# Patient Record
Sex: Male | Born: 1960 | Race: White | Hispanic: No | Smoking: Current every day smoker
Health system: Southern US, Community
[De-identification: ages and names within clinical notes are randomized; demographics above are authoritative.]

## PROBLEM LIST (undated history)

## (undated) DIAGNOSIS — F419 Anxiety disorder, unspecified: Secondary | ICD-10-CM

## (undated) DIAGNOSIS — G4730 Sleep apnea, unspecified: Secondary | ICD-10-CM

## (undated) DIAGNOSIS — I10 Essential (primary) hypertension: Secondary | ICD-10-CM

## (undated) DIAGNOSIS — K219 Gastro-esophageal reflux disease without esophagitis: Secondary | ICD-10-CM

## (undated) DIAGNOSIS — E785 Hyperlipidemia, unspecified: Secondary | ICD-10-CM

## (undated) DIAGNOSIS — M4800 Spinal stenosis, site unspecified: Secondary | ICD-10-CM

## (undated) DIAGNOSIS — M1990 Unspecified osteoarthritis, unspecified site: Secondary | ICD-10-CM

## (undated) DIAGNOSIS — D45 Polycythemia vera: Secondary | ICD-10-CM

## (undated) DIAGNOSIS — S63599A Other specified sprain of unspecified wrist, initial encounter: Secondary | ICD-10-CM

## (undated) DIAGNOSIS — F32A Depression, unspecified: Secondary | ICD-10-CM

## (undated) DIAGNOSIS — S83519A Sprain of anterior cruciate ligament of unspecified knee, initial encounter: Secondary | ICD-10-CM

## (undated) DIAGNOSIS — F329 Major depressive disorder, single episode, unspecified: Secondary | ICD-10-CM

## (undated) HISTORY — DX: Sprain of anterior cruciate ligament of unspecified knee, initial encounter: S83.519A

## (undated) HISTORY — DX: Unspecified osteoarthritis, unspecified site: M19.90

## (undated) HISTORY — DX: Hyperlipidemia, unspecified: E78.5

## (undated) HISTORY — DX: Gastro-esophageal reflux disease without esophagitis: K21.9

## (undated) HISTORY — PX: TONSILLECTOMY: SUR1361

## (undated) HISTORY — DX: Other specified sprain of unspecified wrist, initial encounter: S63.599A

## (undated) HISTORY — PX: COLONOSCOPY: SHX174

---

## 1999-10-31 ENCOUNTER — Encounter: Payer: Self-pay | Admitting: Hematology & Oncology

## 1999-10-31 ENCOUNTER — Ambulatory Visit (HOSPITAL_COMMUNITY): Admission: RE | Admit: 1999-10-31 | Discharge: 1999-10-31 | Payer: Self-pay | Admitting: Hematology & Oncology

## 2000-06-20 ENCOUNTER — Encounter: Payer: Self-pay | Admitting: Hematology & Oncology

## 2000-06-20 ENCOUNTER — Ambulatory Visit (HOSPITAL_COMMUNITY): Admission: RE | Admit: 2000-06-20 | Discharge: 2000-06-20 | Payer: Self-pay | Admitting: Hematology & Oncology

## 2000-07-04 ENCOUNTER — Ambulatory Visit (HOSPITAL_COMMUNITY): Admission: RE | Admit: 2000-07-04 | Discharge: 2000-07-04 | Payer: Self-pay | Admitting: Hematology & Oncology

## 2000-07-04 ENCOUNTER — Encounter: Payer: Self-pay | Admitting: Hematology & Oncology

## 2000-07-18 ENCOUNTER — Encounter: Payer: Self-pay | Admitting: Hematology & Oncology

## 2000-07-18 ENCOUNTER — Ambulatory Visit (HOSPITAL_COMMUNITY): Admission: RE | Admit: 2000-07-18 | Discharge: 2000-07-18 | Payer: Self-pay | Admitting: Hematology & Oncology

## 2004-05-04 ENCOUNTER — Ambulatory Visit: Payer: Self-pay | Admitting: Hematology & Oncology

## 2004-07-05 ENCOUNTER — Ambulatory Visit: Payer: Self-pay | Admitting: Hematology & Oncology

## 2004-10-03 ENCOUNTER — Ambulatory Visit: Payer: Self-pay | Admitting: Hematology & Oncology

## 2004-12-04 ENCOUNTER — Ambulatory Visit: Payer: Self-pay | Admitting: Hematology & Oncology

## 2005-02-06 ENCOUNTER — Ambulatory Visit: Payer: Self-pay | Admitting: Hematology & Oncology

## 2005-05-10 ENCOUNTER — Ambulatory Visit: Payer: Self-pay | Admitting: Hematology & Oncology

## 2005-07-18 ENCOUNTER — Ambulatory Visit: Payer: Self-pay | Admitting: Hematology & Oncology

## 2005-11-07 ENCOUNTER — Ambulatory Visit: Payer: Self-pay | Admitting: Hematology & Oncology

## 2005-11-09 LAB — CBC WITH DIFFERENTIAL/PLATELET
BASO%: 0.5 % (ref 0.0–2.0)
Eosinophils Absolute: 0.3 10*3/uL (ref 0.0–0.5)
LYMPH%: 20.6 % (ref 14.0–48.0)
MONO#: 0.6 10*3/uL (ref 0.1–0.9)
NEUT#: 6 10*3/uL (ref 1.5–6.5)
Platelets: 221 10*3/uL (ref 145–400)
RBC: 5.81 10*6/uL — ABNORMAL HIGH (ref 4.20–5.71)
WBC: 8.8 10*3/uL (ref 4.0–10.0)
lymph#: 1.8 10*3/uL (ref 0.9–3.3)

## 2006-01-09 ENCOUNTER — Ambulatory Visit: Payer: Self-pay | Admitting: Hematology & Oncology

## 2006-03-11 ENCOUNTER — Ambulatory Visit: Payer: Self-pay | Admitting: Hematology & Oncology

## 2006-03-13 LAB — CBC WITH DIFFERENTIAL/PLATELET
BASO%: 0.6 % (ref 0.0–2.0)
Eosinophils Absolute: 0.1 10*3/uL (ref 0.0–0.5)
MCHC: 34.3 g/dL (ref 32.0–35.9)
MCV: 85 fL (ref 81.6–98.0)
MONO#: 0.5 10*3/uL (ref 0.1–0.9)
MONO%: 7.1 % (ref 0.0–13.0)
NEUT#: 4.8 10*3/uL (ref 1.5–6.5)
RBC: 5.56 10*6/uL (ref 4.20–5.71)
RDW: 13.4 % (ref 11.2–14.6)
WBC: 7.2 10*3/uL (ref 4.0–10.0)

## 2006-05-06 ENCOUNTER — Ambulatory Visit: Payer: Self-pay | Admitting: Hematology & Oncology

## 2006-05-09 LAB — CBC WITH DIFFERENTIAL/PLATELET
BASO%: 1.5 % (ref 0.0–2.0)
HCT: 41.4 % (ref 38.7–49.9)
LYMPH%: 22.4 % (ref 14.0–48.0)
MCH: 28.6 pg (ref 28.0–33.4)
MCHC: 33.8 g/dL (ref 32.0–35.9)
MCV: 84.8 fL (ref 81.6–98.0)
MONO#: 0.6 10*3/uL (ref 0.1–0.9)
NEUT%: 65.8 % (ref 40.0–75.0)
Platelets: 245 10*3/uL (ref 145–400)
WBC: 8.4 10*3/uL (ref 4.0–10.0)

## 2006-07-08 ENCOUNTER — Ambulatory Visit: Payer: Self-pay | Admitting: Hematology & Oncology

## 2006-07-11 LAB — CBC WITH DIFFERENTIAL/PLATELET
Basophils Absolute: 0 10*3/uL (ref 0.0–0.1)
Eosinophils Absolute: 0.2 10*3/uL (ref 0.0–0.5)
HCT: 44.4 % (ref 38.7–49.9)
HGB: 15.6 g/dL (ref 13.0–17.1)
LYMPH%: 21.1 % (ref 14.0–48.0)
MONO#: 0.5 10*3/uL (ref 0.1–0.9)
NEUT#: 5.5 10*3/uL (ref 1.5–6.5)
NEUT%: 70.3 % (ref 40.0–75.0)
Platelets: 200 10*3/uL (ref 145–400)
WBC: 7.9 10*3/uL (ref 4.0–10.0)
lymph#: 1.7 10*3/uL (ref 0.9–3.3)

## 2006-09-03 ENCOUNTER — Ambulatory Visit: Payer: Self-pay | Admitting: Hematology & Oncology

## 2006-09-05 LAB — CBC WITH DIFFERENTIAL/PLATELET
BASO%: 0.6 % (ref 0.0–2.0)
Basophils Absolute: 0 10*3/uL (ref 0.0–0.1)
EOS%: 3.4 % (ref 0.0–7.0)
HCT: 46.4 % (ref 38.7–49.9)
HGB: 15.8 g/dL (ref 13.0–17.1)
LYMPH%: 22.1 % (ref 14.0–48.0)
MCH: 28.6 pg (ref 28.0–33.4)
MCHC: 34 g/dL (ref 32.0–35.9)
NEUT%: 67.7 % (ref 40.0–75.0)
Platelets: 192 10*3/uL (ref 145–400)
lymph#: 1.6 10*3/uL (ref 0.9–3.3)

## 2006-11-06 ENCOUNTER — Ambulatory Visit: Payer: Self-pay | Admitting: Hematology & Oncology

## 2006-11-08 LAB — CBC WITH DIFFERENTIAL/PLATELET
BASO%: 0.5 % (ref 0.0–2.0)
Basophils Absolute: 0 10*3/uL (ref 0.0–0.1)
EOS%: 4.2 % (ref 0.0–7.0)
HCT: 43.9 % (ref 38.7–49.9)
HGB: 15.4 g/dL (ref 13.0–17.1)
MCH: 29.7 pg (ref 28.0–33.4)
MCHC: 35.1 g/dL (ref 32.0–35.9)
MCV: 84.6 fL (ref 81.6–98.0)
MONO%: 7.2 % (ref 0.0–13.0)
NEUT%: 64.4 % (ref 40.0–75.0)
RDW: 13.6 % (ref 11.2–14.6)
lymph#: 2 10*3/uL (ref 0.9–3.3)

## 2007-01-06 ENCOUNTER — Ambulatory Visit: Payer: Self-pay | Admitting: Hematology & Oncology

## 2007-01-09 LAB — CBC WITH DIFFERENTIAL/PLATELET
BASO%: 0.3 % (ref 0.0–2.0)
EOS%: 2.3 % (ref 0.0–7.0)
LYMPH%: 19.4 % (ref 14.0–48.0)
MCHC: 34.9 g/dL (ref 32.0–35.9)
MCV: 86.4 fL (ref 81.6–98.0)
MONO%: 5.3 % (ref 0.0–13.0)
Platelets: 204 10*3/uL (ref 145–400)
RBC: 5.59 10*6/uL (ref 4.20–5.71)
WBC: 8.7 10*3/uL (ref 4.0–10.0)

## 2007-01-30 LAB — CBC WITH DIFFERENTIAL/PLATELET
BASO%: 0.5 % (ref 0.0–2.0)
LYMPH%: 25.2 % (ref 14.0–48.0)
MCHC: 35.1 g/dL (ref 32.0–35.9)
MONO#: 0.6 10*3/uL (ref 0.1–0.9)
RBC: 5.15 10*6/uL (ref 4.20–5.71)
WBC: 8.2 10*3/uL (ref 4.0–10.0)
lymph#: 2.1 10*3/uL (ref 0.9–3.3)

## 2007-03-04 ENCOUNTER — Ambulatory Visit: Payer: Self-pay | Admitting: Hematology & Oncology

## 2007-05-07 ENCOUNTER — Ambulatory Visit: Payer: Self-pay | Admitting: Hematology & Oncology

## 2007-05-22 LAB — CBC WITH DIFFERENTIAL/PLATELET
BASO%: 1.1 % (ref 0.0–2.0)
HCT: 46.7 % (ref 38.7–49.9)
HGB: 15.9 g/dL (ref 13.0–17.1)
MCHC: 34 g/dL (ref 32.0–35.9)
MONO#: 0.6 10*3/uL (ref 0.1–0.9)
NEUT%: 69.8 % (ref 40.0–75.0)
RDW: 11.5 % (ref 11.2–14.6)
WBC: 7 10*3/uL (ref 4.0–10.0)
lymph#: 1.3 10*3/uL (ref 0.9–3.3)

## 2007-05-30 LAB — CBC WITH DIFFERENTIAL/PLATELET
Basophils Absolute: 0.2 10*3/uL — ABNORMAL HIGH (ref 0.0–0.1)
EOS%: 3 % (ref 0.0–7.0)
Eosinophils Absolute: 0.3 10*3/uL (ref 0.0–0.5)
HCT: 48.1 % (ref 38.7–49.9)
HGB: 16.5 g/dL (ref 13.0–17.1)
MCH: 28.8 pg (ref 28.0–33.4)
MONO#: 0.6 10*3/uL (ref 0.1–0.9)
NEUT#: 6.1 10*3/uL (ref 1.5–6.5)
NEUT%: 70.2 % (ref 40.0–75.0)
lymph#: 1.6 10*3/uL (ref 0.9–3.3)

## 2007-09-30 ENCOUNTER — Ambulatory Visit: Payer: Self-pay | Admitting: Hematology & Oncology

## 2007-10-03 LAB — CBC WITH DIFFERENTIAL/PLATELET
Basophils Absolute: 0.1 10*3/uL (ref 0.0–0.1)
Eosinophils Absolute: 0.3 10*3/uL (ref 0.0–0.5)
HGB: 16.1 g/dL (ref 13.0–17.1)
MCV: 83.6 fL (ref 81.6–98.0)
MONO#: 0.6 10*3/uL (ref 0.1–0.9)
NEUT#: 5 10*3/uL (ref 1.5–6.5)
RDW: 14.4 % (ref 11.2–14.6)
WBC: 8 10*3/uL (ref 4.0–10.0)
lymph#: 2.1 10*3/uL (ref 0.9–3.3)

## 2007-10-10 LAB — CBC WITH DIFFERENTIAL/PLATELET
Basophils Absolute: 0.1 10*3/uL (ref 0.0–0.1)
Eosinophils Absolute: 0.4 10*3/uL (ref 0.0–0.5)
HCT: 46.5 % (ref 38.7–49.9)
HGB: 15.9 g/dL (ref 13.0–17.1)
LYMPH%: 21 % (ref 14.0–48.0)
MONO#: 0.5 10*3/uL (ref 0.1–0.9)
NEUT#: 5.1 10*3/uL (ref 1.5–6.5)
NEUT%: 65.9 % (ref 40.0–75.0)
Platelets: 228 10*3/uL (ref 145–400)
WBC: 7.8 10*3/uL (ref 4.0–10.0)

## 2008-01-21 ENCOUNTER — Ambulatory Visit: Payer: Self-pay | Admitting: Hematology & Oncology

## 2008-01-30 LAB — CBC WITH DIFFERENTIAL (CANCER CENTER ONLY)
BASO#: 0.1 10*3/uL (ref 0.0–0.2)
BASO%: 1.3 % (ref 0.0–2.0)
EOS%: 4.7 % (ref 0.0–7.0)
HGB: 16.5 g/dL (ref 13.0–17.1)
LYMPH#: 1.7 10*3/uL (ref 0.9–3.3)
MCH: 28.8 pg (ref 28.0–33.4)
MCHC: 35.1 g/dL (ref 32.0–35.9)
MONO%: 7.9 % (ref 0.0–13.0)
NEUT#: 4.7 10*3/uL (ref 1.5–6.5)
NEUT%: 63.7 % (ref 40.0–80.0)
RDW: 14 % (ref 10.5–14.6)

## 2008-03-05 LAB — CBC WITH DIFFERENTIAL (CANCER CENTER ONLY)
Eosinophils Absolute: 0.3 10*3/uL (ref 0.0–0.5)
MCH: 28.8 pg (ref 28.0–33.4)
MONO%: 5.5 % (ref 0.0–13.0)
NEUT#: 4.9 10*3/uL (ref 1.5–6.5)
Platelets: 157 10*3/uL (ref 145–400)
RBC: 5.36 10*6/uL (ref 4.20–5.70)
WBC: 7.7 10*3/uL (ref 4.0–10.0)

## 2008-05-06 ENCOUNTER — Ambulatory Visit: Payer: Self-pay | Admitting: Hematology & Oncology

## 2008-05-07 LAB — CBC WITH DIFFERENTIAL (CANCER CENTER ONLY)
BASO#: 0.2 10*3/uL (ref 0.0–0.2)
EOS%: 3.2 % (ref 0.0–7.0)
LYMPH%: 17.1 % (ref 14.0–48.0)
MCH: 29.5 pg (ref 28.0–33.4)
MCHC: 33.7 g/dL (ref 32.0–35.9)
MONO%: 7.3 % (ref 0.0–13.0)
NEUT#: 4.9 10*3/uL (ref 1.5–6.5)
Platelets: 172 10*3/uL (ref 145–400)

## 2008-05-07 LAB — FERRITIN: Ferritin: 10 ng/mL — ABNORMAL LOW (ref 22–322)

## 2008-05-12 ENCOUNTER — Ambulatory Visit: Payer: Self-pay | Admitting: Radiology

## 2008-05-12 ENCOUNTER — Ambulatory Visit (HOSPITAL_BASED_OUTPATIENT_CLINIC_OR_DEPARTMENT_OTHER): Admission: RE | Admit: 2008-05-12 | Discharge: 2008-05-12 | Payer: Self-pay | Admitting: Hematology & Oncology

## 2008-05-13 LAB — CBC WITH DIFFERENTIAL (CANCER CENTER ONLY)
Eosinophils Absolute: 0.3 10*3/uL (ref 0.0–0.5)
HCT: 47.9 % (ref 38.7–49.9)
HGB: 16.4 g/dL (ref 13.0–17.1)
LYMPH%: 25.2 % (ref 14.0–48.0)
MCV: 87 fL (ref 82–98)
MONO#: 0.3 10*3/uL (ref 0.1–0.9)
NEUT%: 66.3 % (ref 40.0–80.0)
Platelets: 144 10*3/uL — ABNORMAL LOW (ref 145–400)
RBC: 5.49 10*6/uL (ref 4.20–5.70)
WBC: 7.7 10*3/uL (ref 4.0–10.0)

## 2008-05-19 ENCOUNTER — Encounter: Admission: RE | Admit: 2008-05-19 | Discharge: 2008-05-19 | Payer: Self-pay | Admitting: Hematology & Oncology

## 2008-05-20 LAB — CBC WITH DIFFERENTIAL (CANCER CENTER ONLY)
BASO%: 1.3 % (ref 0.0–2.0)
Eosinophils Absolute: 0.3 10*3/uL (ref 0.0–0.5)
LYMPH#: 1.4 10*3/uL (ref 0.9–3.3)
LYMPH%: 13.2 % — ABNORMAL LOW (ref 14.0–48.0)
MCV: 87 fL (ref 82–98)
MONO#: 0.7 10*3/uL (ref 0.1–0.9)
NEUT#: 8.2 10*3/uL — ABNORMAL HIGH (ref 1.5–6.5)
Platelets: 187 10*3/uL (ref 145–400)
RBC: 5.48 10*6/uL (ref 4.20–5.70)
RDW: 12.2 % (ref 10.5–14.6)
WBC: 10.8 10*3/uL — ABNORMAL HIGH (ref 4.0–10.0)

## 2008-06-03 LAB — CBC WITH DIFFERENTIAL (CANCER CENTER ONLY)
EOS%: 3.9 % (ref 0.0–7.0)
HCT: 44.5 % (ref 38.7–49.9)
HGB: 15.1 g/dL (ref 13.0–17.1)
MCV: 87 fL (ref 82–98)
MONO%: 5.8 % (ref 0.0–13.0)
RBC: 5.13 10*6/uL (ref 4.20–5.70)
WBC: 6.8 10*3/uL (ref 4.0–10.0)

## 2008-06-18 ENCOUNTER — Encounter: Admission: RE | Admit: 2008-06-18 | Discharge: 2008-06-18 | Payer: Self-pay | Admitting: Hematology & Oncology

## 2008-06-24 ENCOUNTER — Ambulatory Visit: Payer: Self-pay | Admitting: Hematology & Oncology

## 2008-06-25 LAB — CBC WITH DIFFERENTIAL (CANCER CENTER ONLY)
BASO%: 0.5 % (ref 0.0–2.0)
Eosinophils Absolute: 0.2 10*3/uL (ref 0.0–0.5)
MCH: 29.5 pg (ref 28.0–33.4)
MONO%: 6.8 % (ref 0.0–13.0)
NEUT#: 5.1 10*3/uL (ref 1.5–6.5)
Platelets: 188 10*3/uL (ref 145–400)
RBC: 4.9 10*6/uL (ref 4.20–5.70)
RDW: 11.5 % (ref 10.5–14.6)
WBC: 7.4 10*3/uL (ref 4.0–10.0)

## 2008-06-25 LAB — FERRITIN: Ferritin: 13 ng/mL — ABNORMAL LOW (ref 22–322)

## 2008-08-10 ENCOUNTER — Ambulatory Visit: Payer: Self-pay | Admitting: Hematology & Oncology

## 2008-08-11 LAB — CBC WITH DIFFERENTIAL (CANCER CENTER ONLY)
Eosinophils Absolute: 0.2 10*3/uL (ref 0.0–0.5)
MONO#: 0.5 10*3/uL (ref 0.1–0.9)
NEUT#: 3.3 10*3/uL (ref 1.5–6.5)
Platelets: 184 10*3/uL (ref 145–400)
RBC: 5.26 10*6/uL (ref 4.20–5.70)
WBC: 5.7 10*3/uL (ref 4.0–10.0)

## 2008-09-28 ENCOUNTER — Ambulatory Visit: Payer: Self-pay | Admitting: Hematology & Oncology

## 2008-09-29 LAB — CBC WITH DIFFERENTIAL (CANCER CENTER ONLY)
EOS%: 4.3 % (ref 0.0–7.0)
Eosinophils Absolute: 0.3 10*3/uL (ref 0.0–0.5)
LYMPH#: 1.2 10*3/uL (ref 0.9–3.3)
MCH: 28.8 pg (ref 28.0–33.4)
MCHC: 33 g/dL (ref 32.0–35.9)
MONO%: 4.8 % (ref 0.0–13.0)
NEUT#: 4.3 10*3/uL (ref 1.5–6.5)
Platelets: 163 10*3/uL (ref 145–400)
RBC: 5.57 10*6/uL (ref 4.20–5.70)

## 2008-10-06 LAB — CBC WITH DIFFERENTIAL (CANCER CENTER ONLY)
BASO#: 0.1 10*3/uL (ref 0.0–0.2)
BASO%: 0.9 % (ref 0.0–2.0)
EOS%: 3.3 % (ref 0.0–7.0)
HGB: 14.9 g/dL (ref 13.0–17.1)
MCH: 29.1 pg (ref 28.0–33.4)
MCHC: 33.6 g/dL (ref 32.0–35.9)
MONO%: 6.7 % (ref 0.0–13.0)
NEUT#: 6.4 10*3/uL (ref 1.5–6.5)
RDW: 11.9 % (ref 10.5–14.6)

## 2009-01-26 ENCOUNTER — Ambulatory Visit: Payer: Self-pay | Admitting: Hematology & Oncology

## 2009-01-27 LAB — CBC WITH DIFFERENTIAL (CANCER CENTER ONLY)
BASO#: 0.1 10*3/uL (ref 0.0–0.2)
EOS%: 2.5 % (ref 0.0–7.0)
HCT: 47.9 % (ref 38.7–49.9)
HGB: 15.7 g/dL (ref 13.0–17.1)
LYMPH#: 1.2 10*3/uL (ref 0.9–3.3)
LYMPH%: 18.7 % (ref 14.0–48.0)
MCH: 27.5 pg — ABNORMAL LOW (ref 28.0–33.4)
MCHC: 32.8 g/dL (ref 32.0–35.9)
MCV: 84 fL (ref 82–98)
MONO%: 4.1 % (ref 0.0–13.0)
NEUT%: 73.9 % (ref 40.0–80.0)
RDW: 13.1 % (ref 10.5–14.6)

## 2009-01-27 LAB — CHCC SATELLITE - SMEAR

## 2009-02-03 LAB — CBC WITH DIFFERENTIAL (CANCER CENTER ONLY)
BASO#: 0.1 10*3/uL (ref 0.0–0.2)
Eosinophils Absolute: 0.2 10*3/uL (ref 0.0–0.5)
HCT: 45.7 % (ref 38.7–49.9)
HGB: 15.2 g/dL (ref 13.0–17.1)
LYMPH#: 1.4 10*3/uL (ref 0.9–3.3)
LYMPH%: 19 % (ref 14.0–48.0)
MCV: 83 fL (ref 82–98)
MONO#: 0.5 10*3/uL (ref 0.1–0.9)
NEUT%: 70.7 % (ref 40.0–80.0)
WBC: 7.5 10*3/uL (ref 4.0–10.0)

## 2009-04-18 ENCOUNTER — Emergency Department (HOSPITAL_COMMUNITY): Admission: EM | Admit: 2009-04-18 | Discharge: 2009-04-18 | Payer: Self-pay | Admitting: Emergency Medicine

## 2009-04-18 ENCOUNTER — Encounter: Payer: Self-pay | Admitting: Orthopedic Surgery

## 2009-04-20 ENCOUNTER — Ambulatory Visit: Payer: Self-pay | Admitting: Hematology & Oncology

## 2009-04-21 ENCOUNTER — Ambulatory Visit: Payer: Self-pay | Admitting: Orthopedic Surgery

## 2009-04-21 DIAGNOSIS — S93409A Sprain of unspecified ligament of unspecified ankle, initial encounter: Secondary | ICD-10-CM | POA: Insufficient documentation

## 2009-04-25 ENCOUNTER — Telehealth: Payer: Self-pay | Admitting: Orthopedic Surgery

## 2009-04-25 ENCOUNTER — Encounter: Payer: Self-pay | Admitting: Orthopedic Surgery

## 2009-05-12 ENCOUNTER — Ambulatory Visit: Payer: Self-pay | Admitting: Orthopedic Surgery

## 2009-07-12 ENCOUNTER — Ambulatory Visit: Payer: Self-pay | Admitting: Hematology & Oncology

## 2009-07-29 ENCOUNTER — Ambulatory Visit: Payer: Self-pay | Admitting: Hematology & Oncology

## 2009-07-29 LAB — CBC WITH DIFFERENTIAL (CANCER CENTER ONLY)
BASO#: 0.1 10*3/uL (ref 0.0–0.2)
Eosinophils Absolute: 0.1 10*3/uL (ref 0.0–0.5)
HCT: 48.5 % (ref 38.7–49.9)
HGB: 16.1 g/dL (ref 13.0–17.1)
LYMPH%: 10.4 % — ABNORMAL LOW (ref 14.0–48.0)
MCH: 29.3 pg (ref 28.0–33.4)
MCV: 88 fL (ref 82–98)
MONO#: 0.3 10*3/uL (ref 0.1–0.9)
Platelets: 174 10*3/uL (ref 145–400)
RBC: 5.48 10*6/uL (ref 4.20–5.70)
WBC: 8.9 10*3/uL (ref 4.0–10.0)

## 2009-07-29 LAB — FERRITIN: Ferritin: 18 ng/mL — ABNORMAL LOW (ref 22–322)

## 2009-08-05 LAB — CBC WITH DIFFERENTIAL (CANCER CENTER ONLY)
BASO#: 0.2 10*3/uL (ref 0.0–0.2)
EOS%: 4.3 % (ref 0.0–7.0)
Eosinophils Absolute: 0.4 10*3/uL (ref 0.0–0.5)
HCT: 48.1 % (ref 38.7–49.9)
HGB: 16.1 g/dL (ref 13.0–17.1)
LYMPH#: 2.2 10*3/uL (ref 0.9–3.3)
MONO#: 0.6 10*3/uL (ref 0.1–0.9)
NEUT#: 5.8 10*3/uL (ref 1.5–6.5)
NEUT%: 63.2 % (ref 40.0–80.0)
RBC: 5.5 10*6/uL (ref 4.20–5.70)
WBC: 9.2 10*3/uL (ref 4.0–10.0)

## 2009-09-20 ENCOUNTER — Ambulatory Visit: Payer: Self-pay | Admitting: Hematology & Oncology

## 2009-09-23 LAB — CBC WITH DIFFERENTIAL (CANCER CENTER ONLY)
BASO#: 0.1 10*3/uL (ref 0.0–0.2)
Eosinophils Absolute: 0.3 10*3/uL (ref 0.0–0.5)
HGB: 16.1 g/dL (ref 13.0–17.1)
MCH: 28.6 pg (ref 28.0–33.4)
MONO#: 0.4 10*3/uL (ref 0.1–0.9)
NEUT#: 6 10*3/uL (ref 1.5–6.5)
Platelets: 149 10*3/uL (ref 145–400)
RBC: 5.62 10*6/uL (ref 4.20–5.70)
WBC: 8.1 10*3/uL (ref 4.0–10.0)

## 2009-10-05 LAB — JAK2 GENOTYPR: JAK2 GenotypR: NOT DETECTED

## 2009-10-05 LAB — FERRITIN: Ferritin: 16 ng/mL — ABNORMAL LOW (ref 22–322)

## 2009-10-07 LAB — CBC WITH DIFFERENTIAL (CANCER CENTER ONLY)
BASO%: 0.5 % (ref 0.0–2.0)
EOS%: 4.6 % (ref 0.0–7.0)
LYMPH#: 1.6 10*3/uL (ref 0.9–3.3)
MCHC: 33.2 g/dL (ref 32.0–35.9)
MONO#: 0.4 10*3/uL (ref 0.1–0.9)
NEUT#: 3.3 10*3/uL (ref 1.5–6.5)
Platelets: 199 10*3/uL (ref 145–400)
RDW: 11.6 % (ref 10.5–14.6)
WBC: 5.5 10*3/uL (ref 4.0–10.0)

## 2009-10-24 ENCOUNTER — Ambulatory Visit: Payer: Self-pay | Admitting: Hematology & Oncology

## 2009-10-28 LAB — CBC WITH DIFFERENTIAL (CANCER CENTER ONLY)
BASO#: 0 10*3/uL (ref 0.0–0.2)
BASO%: 0.7 % (ref 0.0–2.0)
EOS%: 5 % (ref 0.0–7.0)
HCT: 43.2 % (ref 38.7–49.9)
HGB: 14.2 g/dL (ref 13.0–17.1)
LYMPH#: 1.7 10*3/uL (ref 0.9–3.3)
MCHC: 32.9 g/dL (ref 32.0–35.9)
MONO#: 0.3 10*3/uL (ref 0.1–0.9)
NEUT#: 3.7 10*3/uL (ref 1.5–6.5)
NEUT%: 61.2 % (ref 40.0–80.0)
WBC: 6 10*3/uL (ref 4.0–10.0)

## 2009-11-23 ENCOUNTER — Ambulatory Visit: Payer: Self-pay | Admitting: Hematology & Oncology

## 2009-11-24 LAB — CBC WITH DIFFERENTIAL (CANCER CENTER ONLY)
BASO%: 0.5 % (ref 0.0–2.0)
EOS%: 2.9 % (ref 0.0–7.0)
Eosinophils Absolute: 0.2 10*3/uL (ref 0.0–0.5)
LYMPH%: 19.3 % (ref 14.0–48.0)
MCH: 26.8 pg — ABNORMAL LOW (ref 28.0–33.4)
MCHC: 32.8 g/dL (ref 32.0–35.9)
MONO%: 6.2 % (ref 0.0–13.0)
NEUT#: 5.1 10*3/uL (ref 1.5–6.5)
Platelets: 165 10*3/uL (ref 145–400)
RBC: 5.5 10*6/uL (ref 4.20–5.70)
RDW: 12.3 % (ref 10.5–14.6)

## 2009-11-24 LAB — FERRITIN: Ferritin: 9 ng/mL — ABNORMAL LOW (ref 22–322)

## 2010-02-09 ENCOUNTER — Ambulatory Visit: Payer: Self-pay | Admitting: Hematology & Oncology

## 2010-02-10 LAB — CBC WITH DIFFERENTIAL (CANCER CENTER ONLY)
BASO%: 0.8 % (ref 0.0–2.0)
EOS%: 4.7 % (ref 0.0–7.0)
HCT: 46.2 % (ref 38.7–49.9)
LYMPH%: 19.8 % (ref 14.0–48.0)
MCHC: 32.5 g/dL (ref 32.0–35.9)
MCV: 81 fL — ABNORMAL LOW (ref 82–98)
NEUT%: 68 % (ref 40.0–80.0)
RDW: 15.8 % — ABNORMAL HIGH (ref 10.5–14.6)

## 2010-02-17 LAB — CBC WITH DIFFERENTIAL (CANCER CENTER ONLY)
BASO#: 0 10*3/uL (ref 0.0–0.2)
EOS%: 3.3 % (ref 0.0–7.0)
HGB: 14.3 g/dL (ref 13.0–17.1)
LYMPH%: 21.8 % (ref 14.0–48.0)
MCH: 26.2 pg — ABNORMAL LOW (ref 28.0–33.4)
MCHC: 32.7 g/dL (ref 32.0–35.9)
MCV: 80 fL — ABNORMAL LOW (ref 82–98)
MONO%: 5.2 % (ref 0.0–13.0)
NEUT#: 4.8 10*3/uL (ref 1.5–6.5)

## 2010-06-08 ENCOUNTER — Ambulatory Visit (HOSPITAL_BASED_OUTPATIENT_CLINIC_OR_DEPARTMENT_OTHER): Payer: Managed Care, Other (non HMO) | Admitting: Hematology & Oncology

## 2010-06-09 LAB — CBC WITH DIFFERENTIAL (CANCER CENTER ONLY)
BASO#: 0.1 10*3/uL (ref 0.0–0.2)
BASO%: 1.4 % (ref 0.0–2.0)
EOS%: 5.2 % (ref 0.0–7.0)
Eosinophils Absolute: 0.3 10*3/uL (ref 0.0–0.5)
HCT: 49.2 % (ref 38.7–49.9)
HGB: 16.2 g/dL (ref 13.0–17.1)
LYMPH#: 1.5 10*3/uL (ref 0.9–3.3)
LYMPH%: 22.4 % (ref 14.0–48.0)
MCH: 28.1 pg (ref 28.0–33.4)
MCHC: 33 g/dL (ref 32.0–35.9)
MCV: 85 fL (ref 82–98)
MONO#: 0.5 10*3/uL (ref 0.1–0.9)
MONO%: 6.9 % (ref 0.0–13.0)
NEUT#: 4.2 10*3/uL (ref 1.5–6.5)
NEUT%: 64.1 % (ref 40.0–80.0)
Platelets: 169 10*3/uL (ref 145–400)
RBC: 5.78 10*6/uL — ABNORMAL HIGH (ref 4.20–5.70)
RDW: 14.1 % (ref 10.5–14.6)
WBC: 6.5 10*3/uL (ref 4.0–10.0)

## 2010-06-09 LAB — FERRITIN: Ferritin: 18 ng/mL — ABNORMAL LOW (ref 22–322)

## 2010-06-14 LAB — CBC WITH DIFFERENTIAL (CANCER CENTER ONLY)
BASO#: 0.1 10*3/uL (ref 0.0–0.2)
BASO%: 0.7 % (ref 0.0–2.0)
EOS%: 3.1 % (ref 0.0–7.0)
Eosinophils Absolute: 0.2 10*3/uL (ref 0.0–0.5)
HCT: 44 % (ref 38.7–49.9)
HGB: 14.6 g/dL (ref 13.0–17.1)
LYMPH#: 1.3 10*3/uL (ref 0.9–3.3)
LYMPH%: 16.9 % (ref 14.0–48.0)
MCH: 28.2 pg (ref 28.0–33.4)
MCHC: 33.2 g/dL (ref 32.0–35.9)
MCV: 85 fL (ref 82–98)
MONO#: 0.4 10*3/uL (ref 0.1–0.9)
MONO%: 5.6 % (ref 0.0–13.0)
NEUT#: 5.6 10*3/uL (ref 1.5–6.5)
NEUT%: 73.7 % (ref 40.0–80.0)
Platelets: 183 10*3/uL (ref 145–400)
RBC: 5.18 10*6/uL (ref 4.20–5.70)
RDW: 13.6 % (ref 10.5–14.6)
WBC: 7.6 10*3/uL (ref 4.0–10.0)

## 2010-06-25 ENCOUNTER — Encounter: Payer: Self-pay | Admitting: Hematology & Oncology

## 2010-08-11 ENCOUNTER — Encounter (HOSPITAL_BASED_OUTPATIENT_CLINIC_OR_DEPARTMENT_OTHER): Payer: Managed Care, Other (non HMO) | Admitting: Hematology & Oncology

## 2010-08-11 DIAGNOSIS — D45 Polycythemia vera: Secondary | ICD-10-CM

## 2010-08-11 DIAGNOSIS — Z7982 Long term (current) use of aspirin: Secondary | ICD-10-CM

## 2010-08-11 LAB — CBC WITH DIFFERENTIAL (CANCER CENTER ONLY)
BASO#: 0.1 10*3/uL (ref 0.0–0.2)
Eosinophils Absolute: 0.4 10*3/uL (ref 0.0–0.5)
HCT: 44.6 % (ref 38.7–49.9)
HGB: 14.6 g/dL (ref 13.0–17.1)
LYMPH#: 1.8 10*3/uL (ref 0.9–3.3)
MCH: 27.1 pg — ABNORMAL LOW (ref 28.0–33.4)
MONO%: 8.2 % (ref 0.0–13.0)
NEUT#: 4.1 10*3/uL (ref 1.5–6.5)
NEUT%: 59.5 % (ref 40.0–80.0)
RBC: 5.38 10*6/uL (ref 4.20–5.70)

## 2010-09-15 ENCOUNTER — Other Ambulatory Visit: Payer: Self-pay | Admitting: Hematology & Oncology

## 2010-09-15 ENCOUNTER — Encounter (HOSPITAL_BASED_OUTPATIENT_CLINIC_OR_DEPARTMENT_OTHER): Payer: Managed Care, Other (non HMO) | Admitting: Hematology & Oncology

## 2010-09-15 DIAGNOSIS — Z7982 Long term (current) use of aspirin: Secondary | ICD-10-CM

## 2010-09-15 DIAGNOSIS — D45 Polycythemia vera: Secondary | ICD-10-CM

## 2010-09-15 LAB — CBC WITH DIFFERENTIAL (CANCER CENTER ONLY)
BASO#: 0.1 10*3/uL (ref 0.0–0.2)
Eosinophils Absolute: 0.3 10*3/uL (ref 0.0–0.5)
HGB: 14.9 g/dL (ref 13.0–17.1)
LYMPH%: 23.9 % (ref 14.0–48.0)
MCH: 27.3 pg — ABNORMAL LOW (ref 28.0–33.4)
MCV: 83 fL (ref 82–98)
MONO%: 9.7 % (ref 0.0–13.0)
NEUT%: 61.2 % (ref 40.0–80.0)
RBC: 5.46 10*6/uL (ref 4.20–5.70)

## 2010-11-03 ENCOUNTER — Other Ambulatory Visit: Payer: Self-pay | Admitting: Hematology & Oncology

## 2010-11-03 ENCOUNTER — Encounter (HOSPITAL_BASED_OUTPATIENT_CLINIC_OR_DEPARTMENT_OTHER): Payer: Managed Care, Other (non HMO) | Admitting: Hematology & Oncology

## 2010-11-03 DIAGNOSIS — D45 Polycythemia vera: Secondary | ICD-10-CM

## 2010-11-03 DIAGNOSIS — Z7982 Long term (current) use of aspirin: Secondary | ICD-10-CM

## 2010-11-03 LAB — CBC WITH DIFFERENTIAL (CANCER CENTER ONLY)
BASO%: 0.6 % (ref 0.0–2.0)
EOS%: 3.2 % (ref 0.0–7.0)
HCT: 45.6 % (ref 38.7–49.9)
LYMPH%: 22.9 % (ref 14.0–48.0)
MCH: 26.2 pg — ABNORMAL LOW (ref 28.0–33.4)
MCHC: 32.9 g/dL (ref 32.0–35.9)
MCV: 80 fL — ABNORMAL LOW (ref 82–98)
MONO#: 0.7 10*3/uL (ref 0.1–0.9)
NEUT%: 64.8 % (ref 40.0–80.0)
RDW: 14.9 % (ref 11.1–15.7)

## 2010-12-18 ENCOUNTER — Other Ambulatory Visit: Payer: Self-pay | Admitting: Hematology & Oncology

## 2010-12-18 ENCOUNTER — Encounter (HOSPITAL_BASED_OUTPATIENT_CLINIC_OR_DEPARTMENT_OTHER): Payer: Managed Care, Other (non HMO) | Admitting: Hematology & Oncology

## 2010-12-18 DIAGNOSIS — D45 Polycythemia vera: Secondary | ICD-10-CM

## 2010-12-18 DIAGNOSIS — Z7982 Long term (current) use of aspirin: Secondary | ICD-10-CM

## 2010-12-18 LAB — FERRITIN: Ferritin: 10 ng/mL — ABNORMAL LOW (ref 22–322)

## 2010-12-18 LAB — CBC WITH DIFFERENTIAL (CANCER CENTER ONLY)
BASO#: 0 10*3/uL (ref 0.0–0.2)
EOS%: 4 % (ref 0.0–7.0)
HGB: 15.8 g/dL (ref 13.0–17.1)
MCH: 27 pg — ABNORMAL LOW (ref 28.0–33.4)
MCHC: 34.1 g/dL (ref 32.0–35.9)
MONO%: 9.1 % (ref 0.0–13.0)
NEUT#: 4.5 10*3/uL (ref 1.5–6.5)

## 2011-02-22 ENCOUNTER — Ambulatory Visit (HOSPITAL_BASED_OUTPATIENT_CLINIC_OR_DEPARTMENT_OTHER): Payer: Managed Care, Other (non HMO) | Admitting: Hematology & Oncology

## 2011-02-22 ENCOUNTER — Other Ambulatory Visit: Payer: Self-pay | Admitting: Hematology & Oncology

## 2011-02-22 DIAGNOSIS — IMO0002 Reserved for concepts with insufficient information to code with codable children: Secondary | ICD-10-CM

## 2011-02-22 DIAGNOSIS — D45 Polycythemia vera: Secondary | ICD-10-CM

## 2011-02-22 DIAGNOSIS — M545 Low back pain: Secondary | ICD-10-CM

## 2011-02-22 DIAGNOSIS — Z7982 Long term (current) use of aspirin: Secondary | ICD-10-CM

## 2011-02-22 LAB — CBC WITH DIFFERENTIAL (CANCER CENTER ONLY)
BASO%: 0.8 % (ref 0.0–2.0)
EOS%: 3.5 % (ref 0.0–7.0)
HGB: 15.2 g/dL (ref 13.0–17.1)
LYMPH#: 1.4 10*3/uL (ref 0.9–3.3)
MCH: 27.9 pg — ABNORMAL LOW (ref 28.0–33.4)
MCHC: 34.2 g/dL (ref 32.0–35.9)
MONO%: 9.1 % (ref 0.0–13.0)
NEUT#: 4.4 10*3/uL (ref 1.5–6.5)
Platelets: 159 10*3/uL (ref 145–400)

## 2011-02-22 LAB — RETICULOCYTES (CHCC)
ABS Retic: 77 10*3/uL (ref 19.0–186.0)
RBC.: 5.5 MIL/uL (ref 4.22–5.81)
Retic Ct Pct: 1.4 % (ref 0.4–2.3)

## 2011-04-24 ENCOUNTER — Other Ambulatory Visit: Payer: Self-pay | Admitting: *Deleted

## 2011-04-24 DIAGNOSIS — M47817 Spondylosis without myelopathy or radiculopathy, lumbosacral region: Secondary | ICD-10-CM | POA: Insufficient documentation

## 2011-04-24 DIAGNOSIS — D45 Polycythemia vera: Secondary | ICD-10-CM | POA: Insufficient documentation

## 2011-04-24 MED ORDER — MORPHINE SULFATE 30 MG PO TABS: 30.0000 mg | ORAL_TABLET | Freq: Four times a day (QID) | ORAL | Status: DC

## 2011-04-24 MED ORDER — OXYMORPHONE HCL ER 20 MG PO TB12: 20.0000 mg | ORAL_TABLET | Freq: Two times a day (BID) | ORAL | Status: DC

## 2011-05-03 ENCOUNTER — Ambulatory Visit (HOSPITAL_BASED_OUTPATIENT_CLINIC_OR_DEPARTMENT_OTHER): Payer: Managed Care, Other (non HMO) | Admitting: Hematology & Oncology

## 2011-05-03 ENCOUNTER — Other Ambulatory Visit: Payer: Self-pay | Admitting: Hematology & Oncology

## 2011-05-03 ENCOUNTER — Other Ambulatory Visit: Payer: Managed Care, Other (non HMO) | Admitting: Lab

## 2011-05-03 VITALS — BP 135/88 | HR 77 | Temp 97.2°F | Wt 244.0 lb

## 2011-05-03 DIAGNOSIS — Z7982 Long term (current) use of aspirin: Secondary | ICD-10-CM

## 2011-05-03 DIAGNOSIS — IMO0002 Reserved for concepts with insufficient information to code with codable children: Secondary | ICD-10-CM

## 2011-05-03 DIAGNOSIS — D45 Polycythemia vera: Secondary | ICD-10-CM

## 2011-05-03 DIAGNOSIS — M545 Low back pain: Secondary | ICD-10-CM

## 2011-05-03 LAB — CBC WITH DIFFERENTIAL (CANCER CENTER ONLY)
BASO#: 0 10*3/uL (ref 0.0–0.2)
Eosinophils Absolute: 0.3 10*3/uL (ref 0.0–0.5)
HGB: 16 g/dL (ref 13.0–17.1)
LYMPH%: 23.5 % (ref 14.0–48.0)
MCH: 27.8 pg — ABNORMAL LOW (ref 28.0–33.4)
MCV: 82 fL (ref 82–98)
MONO%: 6.4 % (ref 0.0–13.0)
NEUT%: 65.6 % (ref 40.0–80.0)
RBC: 5.76 10*6/uL — ABNORMAL HIGH (ref 4.20–5.70)

## 2011-05-03 LAB — FERRITIN: Ferritin: 25 ng/mL (ref 22–322)

## 2011-05-03 MED ORDER — OXYMORPHONE HCL ER 20 MG PO TB12: 20.0000 mg | ORAL_TABLET | Freq: Two times a day (BID) | ORAL | Status: DC

## 2011-05-03 MED ORDER — MORPHINE SULFATE 30 MG PO TABS: 30.0000 mg | ORAL_TABLET | Freq: Four times a day (QID) | ORAL | Status: DC

## 2011-05-03 NOTE — Progress Notes (Signed)
Jacob Bradford presents today for phlebotomy per MD orders. Phlebotomy procedure started at 1610 and ended at 1615. 500 grams removed. Patient observed for 30 minutes after procedure without any incident. Patient tolerated procedure well. IV needle removed intact.

## 2011-05-04 NOTE — Progress Notes (Signed)
CC:   Marjory Lies, M.D.  DIAGNOSES: 1. Polycythemia vera, JAK2 negative. 2. Chronic low-back pain secondary to degenerative disk disease.  CURRENT THERAPY: 1. Phlebotomy to maintain hematocrit less than 45%. 2. Aspirin 81 mg p.o. daily.  INTERIM HISTORY:  Jacob Bradford comes in for follow-up.  We last saw him in September.  I think he was last phlebotomized back in I think July. He feels okay.  He is still smoking.  He is trying to stop.  Dr. Doristine Counter likely will get him on something after the winter.  He got through Thanksgiving okay.  This was his 1st Thanksgiving without his wife.  She passed away earlier this summer.  He is doing well with the Opana ER for chronic pain control.  He takes MSIR for short-term breakthrough pain.  When we last saw him, his ferritin was I think 15.  He has had no change in bowel or bladder habits.  There has been no double vision or blurred vision.  He has had no rashes.  He has had no leg swelling.  PHYSICAL EXAMINATION:  General Appearance:  This is a mildly obese white gentleman in no obvious distress.  Vital Signs:  97.2, pulse 91, respiratory rate 16, blood pressure 160/98.  Weight is 244.  Head and Neck Exam:  Shows a normocephalic, atraumatic skull.  There are no ocular or oral lesions.  There are no palpable cervical or supraclavicular lymph nodes.  Lungs:  Clear bilaterally.  Cardiac Exam: Regular rate and rhythm with a normal S1 and S2.  There are no murmurs, rubs or bruits.  Abdominal Exam:  Soft with good bowel sounds.  There is no palpable abdominal mass.  There is no fluid wave.  There is no palpable hepatosplenomegaly.  Back Exam:  No tenderness over the spine, ribs or hips.  Extremities:  Show no clubbing, cyanosis or edema.  LABORATORY STUDIES:  White cell count is 8.3, hemoglobin 16, hematocrit 47, platelet count 166.  IMPRESSION:  Jacob Bradford is a 50 year old gentleman with polycythemia vera.  We have been seeing him now  for about 14 years.  So far, he has not had any complications from the polycythemia.  We are being aggressive with his phlebotomy program.  We will go ahead and phlebotomize him today.  By doing this, we should be able to get him through the majority of wintertime.  He will continue on his aspirin which is critical in my mind.  We will go ahead and plan to get him back to see [place] I think in late February or early March.    ______________________________ Josph Macho, M.D. PRE/MEDQ  D:  05/03/2011  T:  05/04/2011  Job:  [id number]

## 2011-05-30 ENCOUNTER — Other Ambulatory Visit: Payer: Self-pay | Admitting: *Deleted

## 2011-05-30 DIAGNOSIS — M545 Low back pain: Secondary | ICD-10-CM

## 2011-05-30 DIAGNOSIS — D45 Polycythemia vera: Secondary | ICD-10-CM

## 2011-05-30 MED ORDER — ALPRAZOLAM 1 MG PO TABS: 1.0000 mg | ORAL_TABLET | Freq: Four times a day (QID) | ORAL | Status: DC

## 2011-05-30 MED ORDER — MORPHINE SULFATE 30 MG PO TABS: 30.0000 mg | ORAL_TABLET | Freq: Four times a day (QID) | ORAL | Status: DC

## 2011-05-30 MED ORDER — OXYMORPHONE HCL ER 20 MG PO TB12: 20.0000 mg | ORAL_TABLET | Freq: Two times a day (BID) | ORAL | Status: DC

## 2011-06-29 ENCOUNTER — Other Ambulatory Visit: Payer: Self-pay | Admitting: *Deleted

## 2011-06-29 DIAGNOSIS — M545 Low back pain: Secondary | ICD-10-CM

## 2011-06-29 DIAGNOSIS — D45 Polycythemia vera: Secondary | ICD-10-CM

## 2011-06-29 MED ORDER — MORPHINE SULFATE 30 MG PO TABS: 30.0000 mg | ORAL_TABLET | Freq: Four times a day (QID) | ORAL | Status: DC

## 2011-06-29 MED ORDER — OXYMORPHONE HCL ER 20 MG PO TB12: 20.0000 mg | ORAL_TABLET | Freq: Two times a day (BID) | ORAL | Status: DC

## 2011-06-29 NOTE — Telephone Encounter (Signed)
Pt called today to obtain a refill for Opana and MSIR. Dr. Myna Hidalgo was out of the office today and Dr Arbutus Ped was covering for him and was only comfortable with giving him a supply to get him through a few days as he is not his attending physician. Will have Dr Myna Hidalgo provide him with another rx next week to pick up.

## 2011-07-03 ENCOUNTER — Other Ambulatory Visit: Payer: Self-pay | Admitting: *Deleted

## 2011-07-03 DIAGNOSIS — M545 Low back pain: Secondary | ICD-10-CM

## 2011-07-03 DIAGNOSIS — D45 Polycythemia vera: Secondary | ICD-10-CM

## 2011-07-03 MED ORDER — MORPHINE SULFATE 30 MG PO TABS: 30.0000 mg | ORAL_TABLET | Freq: Four times a day (QID) | ORAL | Status: DC

## 2011-07-03 MED ORDER — OXYMORPHONE HCL ER 20 MG PO TB12: 20.0000 mg | ORAL_TABLET | Freq: Two times a day (BID) | ORAL | Status: DC

## 2011-07-03 NOTE — Telephone Encounter (Signed)
Pt called to obtain the remainder of his 1 month supply of his Opana and MSIR. Will route the rx's for Dr Myna Hidalgo to sign.

## 2011-08-01 ENCOUNTER — Ambulatory Visit (HOSPITAL_BASED_OUTPATIENT_CLINIC_OR_DEPARTMENT_OTHER): Payer: Managed Care, Other (non HMO) | Admitting: Hematology & Oncology

## 2011-08-01 ENCOUNTER — Other Ambulatory Visit (HOSPITAL_BASED_OUTPATIENT_CLINIC_OR_DEPARTMENT_OTHER): Payer: Managed Care, Other (non HMO) | Admitting: Lab

## 2011-08-01 ENCOUNTER — Ambulatory Visit (HOSPITAL_BASED_OUTPATIENT_CLINIC_OR_DEPARTMENT_OTHER): Payer: Managed Care, Other (non HMO)

## 2011-08-01 DIAGNOSIS — M545 Low back pain: Secondary | ICD-10-CM

## 2011-08-01 DIAGNOSIS — D45 Polycythemia vera: Secondary | ICD-10-CM

## 2011-08-01 DIAGNOSIS — M47817 Spondylosis without myelopathy or radiculopathy, lumbosacral region: Secondary | ICD-10-CM

## 2011-08-01 LAB — CBC WITH DIFFERENTIAL (CANCER CENTER ONLY)
BASO%: 0.5 % (ref 0.0–2.0)
EOS%: 2 % (ref 0.0–7.0)
LYMPH#: 2 10*3/uL (ref 0.9–3.3)
MCHC: 34 g/dL (ref 32.0–35.9)
MONO#: 0.7 10*3/uL (ref 0.1–0.9)
NEUT#: 5.6 10*3/uL (ref 1.5–6.5)
RDW: 17.7 % — ABNORMAL HIGH (ref 11.1–15.7)
WBC: 8.5 10*3/uL (ref 4.0–10.0)

## 2011-08-01 MED ORDER — MORPHINE SULFATE 30 MG PO TABS: ORAL_TABLET | ORAL | Status: DC

## 2011-08-01 MED ORDER — OXYMORPHONE HCL ER 20 MG PO TB12: 20.0000 mg | ORAL_TABLET | Freq: Two times a day (BID) | ORAL | Status: DC

## 2011-08-01 NOTE — Progress Notes (Signed)
This office note has been dictated.

## 2011-08-01 NOTE — Progress Notes (Signed)
Jacob Bradford presents today for phlebotomy per MD orders. Phlebotomy procedure started at 1605and ended at 1625.500grams removed. Patient observed for 30 minutes after procedure without any incident. Patient tolerated procedure well. IV needle removed intact. Last sent of vital sign BP - 120/89 P -94 RR 18 T- 97.0

## 2011-08-02 NOTE — Progress Notes (Signed)
CC:   Marjory Lies, M.D.  DIAGNOSIS: 1. Polycythemia vera, JAK2 negative. 2. Chronic low back pain, secondary to severe degenerative disk     disease.  CURRENT THERAPY: 1. Phlebotomy to maintain hematocrit less than 45%. 2. Aspirin 81 mg p.o. daily.  INTERVAL HISTORY:  Jacob Bradford comes in for his followup.  He is doing fairly well.  He was last phlebotomized back in November.  He is trying to cut back on smoking.  He is doing real good job with this.  Work has been taken a toll on him.  His lower back just has been hurting.  He is on Opana for pain.  This, he says, does help him. He says it is a generic version now, which he feels is better.  There has had no cough.  He has had no shortness breath.  He does complain of a little bit of an intermittent headache.  This is localized to the left frontal region.  This comes and goes for the past year. There are no associated visual difficulties.  He has had no other symptoms.  PHYSICAL EXAMINATION:  This is a well-developed well-nourished white gentleman in no obvious distress.  Vital signs:  97.6, pulse 97, respiratory rate 14, blood pressure 127/84.  Weight is 239.  Head and neck:  Normocephalic, atraumatic skull.  There are no ocular or oral lesions.  There are no palpable cervical or supraclavicular lymph nodes. Lungs:  Clear to percussion and auscultation bilaterally.  Cardiac: Regular rate and rhythm with a normal S1, S2.  There are no murmurs, rubs or bruits.  Abdomen:  Soft with good bowel sounds.  There is no palpable abdominal mass.  There is no fluid wave.  There is no palpable hepatosplenomegaly.  Back:  No tenderness over the spine, ribs, or hips. Extremities:  No clubbing, cyanosis or edema.  There is good range motion of his joints.  Skin:  No rashes in the scalp or the left frontal region.  He has no petechiae or ecchymoses.  Neurologic:  No focal neurological deficits.  LABORATORY STUDIES:  White count 3.5,  hemoglobin 16.4, hematocrit 48.3, platelet count 177.  IMPRESSION:  Jacob Bradford is a 51 year old gentleman with polycythemia vera.  We have been following him now for 16 years.  Again, he has had no complications from polycythemia.  He tells me that he is not taking his aspirin religiously.  I told that he really needs to do this.  He definitely needs to be phlebotomized.  Will see about setting him up for a phlebotomy today.  I do want to get him back in about 2 months now, so that we can stay on top of the hemoglobin.  Again, he is doing well with his pain medication.  Will go ahead and refill his pain medication.  I will see him back in about 2 months now.    ______________________________ Jacob Bradford, M.D. PRE/MEDQ  D:  08/01/2011  T:  08/02/2011  Job:  [id number]

## 2011-08-27 ENCOUNTER — Other Ambulatory Visit: Payer: Self-pay | Admitting: *Deleted

## 2011-08-27 DIAGNOSIS — D45 Polycythemia vera: Secondary | ICD-10-CM

## 2011-08-27 DIAGNOSIS — M545 Low back pain: Secondary | ICD-10-CM

## 2011-08-27 DIAGNOSIS — M47817 Spondylosis without myelopathy or radiculopathy, lumbosacral region: Secondary | ICD-10-CM

## 2011-08-27 MED ORDER — MORPHINE SULFATE 30 MG PO TABS: ORAL_TABLET | ORAL | Status: DC

## 2011-08-27 MED ORDER — OXYMORPHONE HCL ER 20 MG PO TB12: 20.0000 mg | ORAL_TABLET | Freq: Two times a day (BID) | ORAL | Status: DC

## 2011-08-27 NOTE — Telephone Encounter (Signed)
Reprinted due to the wrong amount entered for the pt.

## 2011-08-27 NOTE — Telephone Encounter (Signed)
Addended by: Wynonia Hazard on: 08/27/2011 04:12 PM   Modules accepted: Orders

## 2011-09-26 ENCOUNTER — Ambulatory Visit (HOSPITAL_BASED_OUTPATIENT_CLINIC_OR_DEPARTMENT_OTHER): Payer: Managed Care, Other (non HMO) | Admitting: Hematology & Oncology

## 2011-09-26 ENCOUNTER — Ambulatory Visit (HOSPITAL_BASED_OUTPATIENT_CLINIC_OR_DEPARTMENT_OTHER): Payer: Managed Care, Other (non HMO)

## 2011-09-26 ENCOUNTER — Other Ambulatory Visit (HOSPITAL_BASED_OUTPATIENT_CLINIC_OR_DEPARTMENT_OTHER): Payer: Managed Care, Other (non HMO) | Admitting: Lab

## 2011-09-26 VITALS — BP 145/91 | HR 92 | Temp 97.0°F | Ht 70.0 in | Wt 236.0 lb

## 2011-09-26 VITALS — BP 130/82 | HR 91 | Temp 97.2°F

## 2011-09-26 DIAGNOSIS — M47817 Spondylosis without myelopathy or radiculopathy, lumbosacral region: Secondary | ICD-10-CM

## 2011-09-26 DIAGNOSIS — D45 Polycythemia vera: Secondary | ICD-10-CM

## 2011-09-26 DIAGNOSIS — M545 Low back pain: Secondary | ICD-10-CM

## 2011-09-26 LAB — CBC WITH DIFFERENTIAL (CANCER CENTER ONLY)
BASO%: 0.6 % (ref 0.0–2.0)
LYMPH%: 20.6 % (ref 14.0–48.0)
MCV: 84 fL (ref 82–98)
MONO#: 0.6 10*3/uL (ref 0.1–0.9)
MONO%: 7.9 % (ref 0.0–13.0)
Platelets: 191 10*3/uL (ref 145–400)
RDW: 14.4 % (ref 11.1–15.7)
WBC: 8 10*3/uL (ref 4.0–10.0)

## 2011-09-26 LAB — FERRITIN: Ferritin: 11 ng/mL — ABNORMAL LOW (ref 22–322)

## 2011-09-26 LAB — CHCC SATELLITE - SMEAR

## 2011-09-26 MED ORDER — MORPHINE SULFATE 30 MG PO TABS: ORAL_TABLET | ORAL | Status: DC

## 2011-09-26 MED ORDER — OXYMORPHONE HCL ER 20 MG PO TB12: 20.0000 mg | ORAL_TABLET | Freq: Two times a day (BID) | ORAL | Status: DC

## 2011-09-26 NOTE — Progress Notes (Signed)
This office note has been dictated.

## 2011-09-26 NOTE — Progress Notes (Signed)
Jacob Bradford presents today for phlebotomy per MD orders. Phlebotomy procedure started at 1535 and ended at 1545 500 ml removed. Patient observed for 30 minutes after procedure without any incident. Patient tolerated procedure well. IV needle removed intact.  See vitals sheet for last set of vitals signs.

## 2011-09-27 NOTE — Progress Notes (Signed)
CC:   Marjory Lies, M.D.  DIAGNOSES: 1. Polycythemia vera, JAK2-negative. 2. Severe degenerative lumbar disk disease.  CURRENT THERAPY: 1. Phlebotomy to maintain hematocrit less than 45%. 2. Aspirin 81 mg p.o. daily.  INTERIM HISTORY:  Mr. Jacob Bradford comes in for his followup.  He is doing fairly well.  He has had no increase in his lower back discomfort.  He is tolerating his medications very well.  The Opana and MSIR really have helped make his life much more tolerable.  I think his last phlebotomy was back in February.  He has been working. He has been having no issues headache-wise.  He has had no leg swelling. He has had no cough or shortness of breath.  He has had some decrease in his tobacco use.  PHYSICAL EXAMINATION:  This is a well-developed, well-nourished white gentleman in no obvious distress.  Vital signs:  Temperature of 97, pulse 92, respiratory rate 18, blood pressure 145/91.  Weight is 236. Head and neck exam shows a normocephalic, atraumatic skull.  There are no ocular or oral lesions.  There are no palpable cervical or supraclavicular lymph nodes.  Lungs:  Clear bilaterally.  Cardiac: Regular rate and rhythm with a normal S1 and S2.  There are no murmurs, rubs or bruits.  Abdomen:  Soft with good bowel sounds.  There is no palpable fluid wave.  There is no palpable hepatosplenomegaly. Extremities show no clubbing, cyanosis or edema.  Neurological exam shows no focal neurological deficits.  Skin exam shows a ruddy complexion to his skin.  LABORATORY STUDIES:  White cell count is 8, hemoglobin 16.7, hematocrit 49.5, platelet count 191.  MCV is 84.  IMPRESSION:  Mr. Beem is 51 year old gentleman with polycythemia vera.  He has done well with the polycythemia.  When we last checked his ferritin, it was 25 back in November.  He clearly needs to be phlebotomized today.  I told him I want to make sure that he does keep well-hydrated.  I want to see him back in  about 6 weeks' time.  Hopefully, we will see that his phlebotomy today will improve his hematocrit.   ______________________________ Josph Macho, M.D. PRE/MEDQ  D:  09/26/2011  T:  09/27/2011  Job:  [id number]

## 2011-10-25 ENCOUNTER — Other Ambulatory Visit: Payer: Self-pay | Admitting: *Deleted

## 2011-10-25 DIAGNOSIS — D45 Polycythemia vera: Secondary | ICD-10-CM

## 2011-10-25 DIAGNOSIS — M545 Low back pain: Secondary | ICD-10-CM

## 2011-10-25 DIAGNOSIS — M47817 Spondylosis without myelopathy or radiculopathy, lumbosacral region: Secondary | ICD-10-CM

## 2011-10-25 MED ORDER — OXYMORPHONE HCL ER 20 MG PO TB12: 20.0000 mg | ORAL_TABLET | Freq: Two times a day (BID) | ORAL | Status: DC

## 2011-10-25 MED ORDER — MORPHINE SULFATE 30 MG PO TABS: ORAL_TABLET | ORAL | Status: DC

## 2011-10-25 NOTE — Telephone Encounter (Signed)
Pt called on Tues requesting to have his rx for Opana and MSIR ready for today or tomorrow for his dgtr to pick up. He is on schedule for monthly refill. Will route to Dr Myna Hidalgo to sign then place at the front desk for pick up.

## 2011-11-08 ENCOUNTER — Other Ambulatory Visit (HOSPITAL_BASED_OUTPATIENT_CLINIC_OR_DEPARTMENT_OTHER): Payer: Managed Care, Other (non HMO) | Admitting: Lab

## 2011-11-08 ENCOUNTER — Ambulatory Visit: Payer: Managed Care, Other (non HMO)

## 2011-11-08 ENCOUNTER — Ambulatory Visit (HOSPITAL_BASED_OUTPATIENT_CLINIC_OR_DEPARTMENT_OTHER): Payer: Managed Care, Other (non HMO) | Admitting: Hematology & Oncology

## 2011-11-08 ENCOUNTER — Other Ambulatory Visit: Payer: Managed Care, Other (non HMO) | Admitting: Lab

## 2011-11-08 ENCOUNTER — Ambulatory Visit: Payer: Managed Care, Other (non HMO) | Admitting: Hematology & Oncology

## 2011-11-08 VITALS — BP 125/79 | HR 63 | Temp 96.6°F | Ht 70.0 in | Wt 231.0 lb

## 2011-11-08 DIAGNOSIS — D45 Polycythemia vera: Secondary | ICD-10-CM

## 2011-11-08 LAB — CBC WITH DIFFERENTIAL (CANCER CENTER ONLY)
BASO#: 0.1 10*3/uL (ref 0.0–0.2)
Eosinophils Absolute: 0.2 10*3/uL (ref 0.0–0.5)
HCT: 44.8 % (ref 38.7–49.9)
HGB: 15.1 g/dL (ref 13.0–17.1)
LYMPH%: 16.3 % (ref 14.0–48.0)
MCH: 28.3 pg (ref 28.0–33.4)
MCV: 84 fL (ref 82–98)
MONO%: 8.7 % (ref 0.0–13.0)
NEUT#: 5.2 10*3/uL (ref 1.5–6.5)
NEUT%: 71 % (ref 40.0–80.0)
RBC: 5.33 10*6/uL (ref 4.20–5.70)

## 2011-11-08 NOTE — Progress Notes (Signed)
Jacob Bradford presents today for phlebotomy per MD orders. Phlebotomy procedure started at 1450and ended at 1500 500 grams removed. Patient observed for 30 minutes after procedure without any incident. Patient tolerated procedure well. IV needle removed intact.

## 2011-11-08 NOTE — Patient Instructions (Signed)
Therapeutic Phlebotomy Therapeutic phlebotomy is the controlled removal of blood from your body for the purpose of treating a medical condition. It is similar to donating blood. Usually, about a pint (470 mL) of blood is removed. The average adult has 9 to 12 pints (4.3 to 5.7 L) of blood. Therapeutic phlebotomy may be used to treat the following medical conditions:  Hemochromatosis. This is a condition in which there is too much iron in the blood.   Polycythemia vera. This is a condition in which there are too many red cells in the blood.   Porphyria cutanea tarda. This is a disease usually passed from one generation to the next (inherited). It is a condition in which an important part of hemoglobin is not made properly. This results in the build up of abnormal amounts of porphyrins in the body.   Sickle cell disease. This is an inherited disease. It is a condition in which the red blood cells form an abnormal crescent shape rather than a round shape.  LET YOUR CAREGIVER KNOW ABOUT:  Allergies.   Medicines taken including herbs, eyedrops, over-the-counter medicines, and creams.   Use of steroids (by mouth or creams).   Previous problems with anesthetics or numbing medicine.   History of blood clots.   History of bleeding or blood problems.   Previous surgery.   Possibility of pregnancy, if this applies.  RISKS AND COMPLICATIONS This is a simple and safe procedure. Problems are unlikely. However, problems can occur and may include:  Nausea or lightheadedness.   Low blood pressure.   Soreness, bleeding, swelling, or bruising at the needle insertion site.   Infection.  BEFORE THE PROCEDURE  This is a procedure that can be done as an outpatient. Confirm the time that you need to arrive for your procedure. Confirm whether there is a need to fast or withhold any medications. It is helpful to wear clothing with sleeves that can be raised above the elbow. A blood sample may be done  to determine the amount of red blood cells or iron in your blood. Plan ahead of time to have someone drive you home after the procedure. PROCEDURE The entire procedure from preparation through recovery takes about 1 hour. The actual collection takes about 10 to 15 minutes.  A needle will be inserted into your vein.   Tubing and a collection bag will be attached to that needle.   Blood will flow through the needle and tubing into the collection bag.   You may be asked to open and close your hand slowly and continuously during the entire collection.   Once the specified amount of blood has been removed from your body, the collection bag and tubing will be clamped.   The needle will be removed.   Pressure will be held on the site of the needle insertion to stop the bleeding. Then a bandage will be placed over the needle insertion site.  AFTER THE PROCEDURE  Your recovery will be assessed and monitored. If there are no problems, as an outpatient, you should be able to go home shortly after the procedure.  Document Released: 10/23/2010 Document Revised: 05/10/2011 Document Reviewed: 10/23/2010 [employer] Patient Information 2012 [employer]. 

## 2011-11-08 NOTE — Progress Notes (Signed)
This office note has been dictated.

## 2011-11-09 NOTE — Progress Notes (Signed)
CC:   Marjory Lies, M.D.  DIAGNOSES: 1. Polycythemia vera, JAK2 negative. 2. Severe lumbosacral degenerative arthritis.  CURRENT THERAPY: 1. Phlebotomy to maintain hematocrit less than 45%. 2. Aspirin 81 mg p.o. daily.  INTERIM HISTORY:  Mr. Gust comes in for followup.  He is doing okay. We last saw back in April.  He was phlebotomized back then.  He has had no problems with headache.  His back is doing okay.  He is on Opana and MSIR, which have allowed him to be functional.  He is working full time.  He has had a good appetite.  He is trying to watch his weight.  He is trying to stay more active.  He has had no burning in the hands or feet.  He has had no double vision or blurred vision.  He has not noted any change in bowel or bladder habits. He still is smoking but is trying his best to cut back.  PHYSICAL EXAMINATION:  This is a well-developed, well-nourished white gentleman in no obvious distress.  Vital signs:  96.6, pulse 63, respiratory rate 22, blood pressure 125/79.  Weight is 231.  Head and neck:  Normocephalic, atraumatic skull.  There are no ocular or oral lesions.  There are no palpable cervical or supraclavicular lymph nodes. Lungs:  Clear bilaterally.  Cardiac:  Regular rate and rhythm with a normal S1 and S2.  There are no murmurs, rubs or bruits.  Abdomen:  Soft with good bowel sounds.  There is no fluid wave.  There is no palpable abdominal mass.  There is no palpable hepatosplenomegaly.  Extremities: No clubbing, cyanosis or edema.  Neurologic:  No focal neurological deficits.  LABORATORY STUDIES:  White cell count is 7.3, hemoglobin 15, hematocrit 45, platelet count is 154.  MCV is 84.  IMPRESSION:  Ms. Percival is a 51 year old gentleman with polycythemia vera.  I have been seeing him now for about 17 years.  He has had no complications from the polycythemia vera.  In order to get through summertime, will go ahead and phlebotomize him today.  By  doing this, we can get him back after Labor Day now.  This will allow him to be with his family, work, and go on 1 vacation without having to worry about coming back to see [place].  I told Mr. Brigante to let us know when we need to refill his pain medications.  He is very reliable about this.    ______________________________ Josph Macho, M.D. PRE/MEDQ  D:  11/08/2011  T:  11/09/2011  Job:  [id number]

## 2011-11-23 ENCOUNTER — Other Ambulatory Visit: Payer: Self-pay | Admitting: *Deleted

## 2011-11-23 DIAGNOSIS — D45 Polycythemia vera: Secondary | ICD-10-CM

## 2011-11-23 DIAGNOSIS — M47817 Spondylosis without myelopathy or radiculopathy, lumbosacral region: Secondary | ICD-10-CM

## 2011-11-23 DIAGNOSIS — M545 Low back pain, unspecified: Secondary | ICD-10-CM

## 2011-11-23 MED ORDER — MORPHINE SULFATE 30 MG PO TABS: ORAL_TABLET | ORAL | Status: DC

## 2011-11-23 MED ORDER — OXYMORPHONE HCL ER 20 MG PO TB12: 20.0000 mg | ORAL_TABLET | Freq: Two times a day (BID) | ORAL | Status: DC

## 2011-11-23 NOTE — Telephone Encounter (Signed)
Pt called on Wed. asking to have his rx ready to be picked up today. They are due to be refilled this weekend. Will route to Dr Myna Hidalgo for approval then place at the front desk for pick up.

## 2011-12-19 ENCOUNTER — Other Ambulatory Visit: Payer: Self-pay | Admitting: *Deleted

## 2011-12-19 DIAGNOSIS — D45 Polycythemia vera: Secondary | ICD-10-CM

## 2011-12-19 DIAGNOSIS — M545 Low back pain: Secondary | ICD-10-CM

## 2011-12-19 DIAGNOSIS — M47817 Spondylosis without myelopathy or radiculopathy, lumbosacral region: Secondary | ICD-10-CM

## 2011-12-19 MED ORDER — OXYMORPHONE HCL ER 20 MG PO TB12: 20.0000 mg | ORAL_TABLET | Freq: Two times a day (BID) | ORAL | Status: DC

## 2011-12-19 MED ORDER — MORPHINE SULFATE 30 MG PO TABS: ORAL_TABLET | ORAL | Status: DC

## 2012-01-18 ENCOUNTER — Other Ambulatory Visit: Payer: Self-pay | Admitting: *Deleted

## 2012-01-18 DIAGNOSIS — D45 Polycythemia vera: Secondary | ICD-10-CM

## 2012-01-18 DIAGNOSIS — M545 Low back pain: Secondary | ICD-10-CM

## 2012-01-18 DIAGNOSIS — M47817 Spondylosis without myelopathy or radiculopathy, lumbosacral region: Secondary | ICD-10-CM

## 2012-01-18 MED ORDER — OXYMORPHONE HCL ER 20 MG PO TB12: 20.0000 mg | ORAL_TABLET | Freq: Two times a day (BID) | ORAL | Status: DC

## 2012-01-18 MED ORDER — MORPHINE SULFATE 30 MG PO TABS: ORAL_TABLET | ORAL | Status: DC

## 2012-01-18 NOTE — Telephone Encounter (Signed)
Pt called on Wed. asking to have his rx ready to be picked up today or Monday. They are due to be refilled this by early next week. Will route to Dr Myna Hidalgo for approval then place at the front desk for pick up on Monday.Marland Kitchen

## 2012-02-06 ENCOUNTER — Ambulatory Visit (HOSPITAL_BASED_OUTPATIENT_CLINIC_OR_DEPARTMENT_OTHER): Payer: Managed Care, Other (non HMO) | Admitting: Hematology & Oncology

## 2012-02-06 ENCOUNTER — Other Ambulatory Visit (HOSPITAL_BASED_OUTPATIENT_CLINIC_OR_DEPARTMENT_OTHER): Payer: Managed Care, Other (non HMO) | Admitting: Lab

## 2012-02-06 VITALS — BP 147/85 | HR 73 | Temp 98.0°F | Resp 20 | Ht 70.0 in | Wt 232.0 lb

## 2012-02-06 DIAGNOSIS — M545 Low back pain, unspecified: Secondary | ICD-10-CM

## 2012-02-06 DIAGNOSIS — M47817 Spondylosis without myelopathy or radiculopathy, lumbosacral region: Secondary | ICD-10-CM

## 2012-02-06 DIAGNOSIS — D45 Polycythemia vera: Secondary | ICD-10-CM

## 2012-02-06 LAB — CBC WITH DIFFERENTIAL (CANCER CENTER ONLY)
BASO#: 0 10*3/uL (ref 0.0–0.2)
EOS%: 1.9 % (ref 0.0–7.0)
Eosinophils Absolute: 0.2 10*3/uL (ref 0.0–0.5)
HCT: 46 % (ref 38.7–49.9)
HGB: 15.4 g/dL (ref 13.0–17.1)
LYMPH#: 1.2 10*3/uL (ref 0.9–3.3)
MCHC: 33.5 g/dL (ref 32.0–35.9)
MONO#: 0.5 10*3/uL (ref 0.1–0.9)
NEUT#: 6.2 10*3/uL (ref 1.5–6.5)
NEUT%: 76.6 % (ref 40.0–80.0)
RBC: 5.48 10*6/uL (ref 4.20–5.70)

## 2012-02-06 MED ORDER — OXYMORPHONE HCL ER 20 MG PO TB12: 20.0000 mg | ORAL_TABLET | Freq: Two times a day (BID) | ORAL | Status: DC

## 2012-02-06 MED ORDER — MORPHINE SULFATE 30 MG PO TABS: ORAL_TABLET | ORAL | Status: DC

## 2012-02-06 NOTE — Patient Instructions (Signed)
Call with any problems

## 2012-02-06 NOTE — Progress Notes (Signed)
This office note has been dictated.

## 2012-02-07 ENCOUNTER — Telehealth: Payer: Self-pay | Admitting: Hematology & Oncology

## 2012-02-07 NOTE — Progress Notes (Signed)
CC:   Evelena Peat, M.D.  DIAGNOSES: 1. Polycythemia vera, JAK2-negative. 2. Severe lumbosacral degenerative arthritis.  CURRENT THERAPY: 1. Phlebotomy to maintain hematocrit less than 45%. 2. Aspirin 81 mg p.o. daily.  INTERIM HISTORY:  Mr. Grosso comes in for his followup.  He is doing quite well.  He has had a decent summer.  We last saw him back in June.  He is still working.  He does have a lot of back issues.  He is on Opana and MSIR.  These have really helped him out.  These have really allowed him to lead a normal lifestyle.  He is eating well.  He is having no headache.  He is having no nausea or vomiting.  There is no cough.  There has been no leg swelling.  He has had no rashes.  He was last phlebotomized, I think back in June.  His last ferritin back in June was 14.  PHYSICAL EXAMINATION:  This is a well-developed, well-nourished white gentleman in no obvious distress.  Vital signs:  Temperature of 98, pulse of 73, respiratory rate 20, blood pressure 147/85.  Weight is 232. Head and neck:  Normocephalic, atraumatic skull.  He has no ocular or oral lesions.  There are no palpable cervical or supraclavicular lymph nodes.  Lungs:  Clear bilaterally.  Cardiac:  Regular rate and rhythm with a normal S1 and S2.  There are no murmurs, rubs or bruits. Abdomen:  Soft with good bowel sounds.  There is no palpable abdominal mass.  No palpable hepatosplenomegaly.  Back:  No tenderness of the spine, ribs, or hips.  There are some slight spasms in the lumbosacral region.  Extremities:  No clubbing, cyanosis or edema.  Neurologic:  No focal neurological deficit.  LABORATORY STUDIES:  White cell count is 8, hemoglobin 15.4, hematocrit 46, platelet count 152.  MCV is 84.  IMPRESSION:  Mr. Kessner is a 51 year old gentleman with polycythemia. We have been following him since, I think 1996.  So far, there have been no complications.  We are being aggressive with his  phlebotomies to maintain his hematocrit below 45.  We will phlebotomize him today.  We will plan to get him back to see [place] in another 2 or 3 months.  We will go ahead and take care of his medications today.  They are not due for another couple of weeks, but he lives [place] a half away and I do not want him to drive down to get his prescriptions.    ______________________________ Josph Macho, M.D. PRE/MEDQ  D:  02/06/2012  T:  02/07/2012  Job:  [id number]

## 2012-02-07 NOTE — Telephone Encounter (Signed)
Mailed Nov schedule

## 2012-03-13 ENCOUNTER — Other Ambulatory Visit: Payer: Self-pay | Admitting: *Deleted

## 2012-03-13 DIAGNOSIS — D45 Polycythemia vera: Secondary | ICD-10-CM

## 2012-03-13 DIAGNOSIS — M545 Low back pain: Secondary | ICD-10-CM

## 2012-03-13 DIAGNOSIS — M47817 Spondylosis without myelopathy or radiculopathy, lumbosacral region: Secondary | ICD-10-CM

## 2012-03-13 MED ORDER — MORPHINE SULFATE 30 MG PO TABS: ORAL_TABLET | ORAL | Status: DC

## 2012-03-13 MED ORDER — OXYMORPHONE HCL ER 20 MG PO TB12: 20.0000 mg | ORAL_TABLET | Freq: Two times a day (BID) | ORAL | Status: DC

## 2012-03-13 NOTE — Telephone Encounter (Signed)
Pt called today asking to see if his pain rx could be picked up early. They are due to be refilled on 03/18/12. He has to make an unexpected trip to the bank and was hoping to save a trip.  Will route to Dr Myna Hidalgo for approval then place at the front desk for pick up but dated not to be filled until 03/18/12.

## 2012-04-09 ENCOUNTER — Ambulatory Visit (HOSPITAL_BASED_OUTPATIENT_CLINIC_OR_DEPARTMENT_OTHER): Payer: Managed Care, Other (non HMO) | Admitting: Hematology & Oncology

## 2012-04-09 ENCOUNTER — Ambulatory Visit: Payer: Managed Care, Other (non HMO)

## 2012-04-09 ENCOUNTER — Other Ambulatory Visit (HOSPITAL_BASED_OUTPATIENT_CLINIC_OR_DEPARTMENT_OTHER): Payer: Managed Care, Other (non HMO) | Admitting: Lab

## 2012-04-09 VITALS — BP 144/95 | HR 86 | Temp 98.1°F | Resp 20 | Ht 70.0 in | Wt 235.0 lb

## 2012-04-09 DIAGNOSIS — D45 Polycythemia vera: Secondary | ICD-10-CM

## 2012-04-09 DIAGNOSIS — M545 Low back pain: Secondary | ICD-10-CM

## 2012-04-09 DIAGNOSIS — M47817 Spondylosis without myelopathy or radiculopathy, lumbosacral region: Secondary | ICD-10-CM

## 2012-04-09 DIAGNOSIS — M5137 Other intervertebral disc degeneration, lumbosacral region: Secondary | ICD-10-CM

## 2012-04-09 LAB — CBC WITH DIFFERENTIAL (CANCER CENTER ONLY)
BASO#: 0 10*3/uL (ref 0.0–0.2)
EOS%: 2.7 % (ref 0.0–7.0)
Eosinophils Absolute: 0.2 10*3/uL (ref 0.0–0.5)
HGB: 14.8 g/dL (ref 13.0–17.1)
LYMPH#: 1.2 10*3/uL (ref 0.9–3.3)
MCHC: 33.4 g/dL (ref 32.0–35.9)
NEUT#: 4.2 10*3/uL (ref 1.5–6.5)
RBC: 5.42 10*6/uL (ref 4.20–5.70)

## 2012-04-09 MED ORDER — MORPHINE SULFATE 30 MG PO TABS: ORAL_TABLET | ORAL | Status: DC

## 2012-04-09 MED ORDER — OXYMORPHONE HCL ER 20 MG PO TB12: 20.0000 mg | ORAL_TABLET | Freq: Two times a day (BID) | ORAL | Status: DC

## 2012-04-09 NOTE — Progress Notes (Signed)
This office note has been dictated.

## 2012-04-10 NOTE — Progress Notes (Signed)
CC:   Evelena Peat, M.D.  DIAGNOSES: 1. Polycythemia vera, JAK2 negative. 2. Severe degenerative disease of the lumbosacral spine.  INTERIM HISTORY:  Mr. Manahan comes in for followup.  He is doing okay. It has now been about 18 months since his wife passed away.  He is getting by.  He has a good family to help him out.  I think we last phlebotomized him back in September.  He has had good pain control with the Opana and MSIR.  When we last saw him, his ferritin was 8.  He has had no problems with bowels or bladder.  There is no headache.  He does take aspirin daily.  He has not noticed any rashes.  There have been no ecchymoses.  PHYSICAL EXAMINATION:  General:  This is a well-developed, well- nourished white gentleman, in no obvious distress.  Vital Signs:  His vital signs show a temperature of 98.1, pulse 86, respiratory rate 20, blood pressure 144/95.  Weight is 235.  Head and neck:  Normocephalic, atraumatic skull.  There are no ocular or oral lesions.  There are no palpable cervical or supraclavicular lymph nodes.  Lungs:  Clear bilaterally.  Cardiac:  Regular rate and rhythm, with a normal S1 and S2.  There are no murmurs, rubs, or bruits.  Abdomen:  Soft, with good bowel sounds.  There is no palpable abdominal mass.  There is no fluid wave.  There is no palpable hepatosplenomegaly.  Back:  No tenderness over the spine, ribs, or hips.  There are some spasms in the lumbosacral spine.  Extremities:  Show no clubbing, cyanosis, or edema.  Has good range of motion of his joints.  Skin:  No rashes, ecchymosis or petechia.  Neurologic:  No focal neurological deficits.  LABORATORY STUDIES:  White cell count is 6, hemoglobin 14.8, hematocrit 44.3, platelet count 183,000.  MCV is 82.  IMPRESSION:  Mr. Reddic is a 51 year old gentleman with polycythemia vera.  He does not need to be phlebotomized today.  His ferritin is low, so this should hopefully help with his  hemoglobin.  We will go ahead and plan to get him back after the holidays now.  I think we can wait after the holidays.  I am sure when we see him back that we will probably need to phlebotomize him.  I did refill his pain medication.  He will get these refilled on 04/14/2012.    ______________________________ Josph Macho, M.D. PRE/MEDQ  D:  04/09/2012  T:  04/10/2012  Job:  [id number]

## 2012-05-15 ENCOUNTER — Other Ambulatory Visit: Payer: Self-pay | Admitting: *Deleted

## 2012-05-15 DIAGNOSIS — M545 Low back pain, unspecified: Secondary | ICD-10-CM

## 2012-05-15 DIAGNOSIS — M47817 Spondylosis without myelopathy or radiculopathy, lumbosacral region: Secondary | ICD-10-CM

## 2012-05-15 DIAGNOSIS — D45 Polycythemia vera: Secondary | ICD-10-CM

## 2012-05-15 MED ORDER — MORPHINE SULFATE 30 MG PO TABS: ORAL_TABLET | ORAL | Status: DC

## 2012-05-15 MED ORDER — OXYMORPHONE HCL ER 20 MG PO TB12: 20.0000 mg | ORAL_TABLET | Freq: Two times a day (BID) | ORAL | Status: DC

## 2012-05-15 NOTE — Telephone Encounter (Signed)
Pt called today asking to see if his pain rx could be picked up around lunch time. They were last filled 04/14/12.  Will route to Dr Myna Hidalgo for approval & signature then place at the front desk for pick up.

## 2012-06-13 ENCOUNTER — Ambulatory Visit (HOSPITAL_BASED_OUTPATIENT_CLINIC_OR_DEPARTMENT_OTHER): Payer: Managed Care, Other (non HMO) | Admitting: Hematology & Oncology

## 2012-06-13 ENCOUNTER — Encounter: Payer: Self-pay | Admitting: *Deleted

## 2012-06-13 ENCOUNTER — Other Ambulatory Visit (HOSPITAL_BASED_OUTPATIENT_CLINIC_OR_DEPARTMENT_OTHER): Payer: Managed Care, Other (non HMO) | Admitting: Lab

## 2012-06-13 ENCOUNTER — Ambulatory Visit (HOSPITAL_BASED_OUTPATIENT_CLINIC_OR_DEPARTMENT_OTHER): Payer: Managed Care, Other (non HMO)

## 2012-06-13 VITALS — BP 151/83 | HR 74 | Temp 97.4°F | Resp 18 | Ht 70.0 in | Wt 236.0 lb

## 2012-06-13 VITALS — BP 140/92 | HR 73 | Resp 16

## 2012-06-13 DIAGNOSIS — D45 Polycythemia vera: Secondary | ICD-10-CM

## 2012-06-13 DIAGNOSIS — M47817 Spondylosis without myelopathy or radiculopathy, lumbosacral region: Secondary | ICD-10-CM

## 2012-06-13 DIAGNOSIS — M545 Low back pain: Secondary | ICD-10-CM

## 2012-06-13 LAB — CBC WITH DIFFERENTIAL (CANCER CENTER ONLY)
BASO#: 0.1 10*3/uL (ref 0.0–0.2)
Eosinophils Absolute: 0.3 10*3/uL (ref 0.0–0.5)
HGB: 15.4 g/dL (ref 13.0–17.1)
LYMPH%: 20.7 % (ref 14.0–48.0)
MCH: 27.8 pg — ABNORMAL LOW (ref 28.0–33.4)
MCV: 85 fL (ref 82–98)
MONO%: 10.3 % (ref 0.0–13.0)
NEUT%: 63.7 % (ref 40.0–80.0)
RBC: 5.53 10*6/uL (ref 4.20–5.70)

## 2012-06-13 MED ORDER — MORPHINE SULFATE 30 MG PO TABS: ORAL_TABLET | ORAL | Status: DC

## 2012-06-13 MED ORDER — OXYMORPHONE HCL ER 20 MG PO TB12: 20.0000 mg | ORAL_TABLET | Freq: Two times a day (BID) | ORAL | Status: DC

## 2012-06-13 NOTE — Patient Instructions (Signed)

## 2012-06-13 NOTE — Progress Notes (Signed)
Jacob Bradford presents today for phlebotomy per MD orders. Phlebotomy procedure started at 1549 and ended at 33. Approximately removed. Patient observed for 30 minutes after procedure without any incident. Patient tolerated procedure well. IV needle removed intact.

## 2012-06-13 NOTE — Progress Notes (Signed)
This office note has been dictated.

## 2012-06-14 NOTE — Progress Notes (Signed)
CC:   Jacob Bradford, M.D.  DIAGNOSES: 1. Polycythemia vera, JAK2 negative. 2. Severe degenerative disk disease of the lumbosacral spine.  CURRENT THERAPY:  Phlebotomy to maintain hematocrit below 45%.  INTERIM HISTORY:  Jacob Bradford comes in for followup.  He did okay over the holidays.  He was with his family.  Again, his wife passed away about a year and a half ago.  This has been tough on him even now.  He is trying to work.  He has been staying busy with work.  He says work has been a little bit on the slow side.  He has had no headache.  He has had no nausea or vomiting.  He has had no cough or shortness of breath.  He says he probably feels like his blood is on the high side because his skin is certainly a little bit more red.  Of note, he does take aspirin every day.  He is very diligent with this.  PHYSICAL EXAMINATION:  General:  This is a well-developed, well- nourished white gentleman in no obvious distress.  Vital signs:  Show temperature of 97.4, pulse 74, respiratory rate 18, blood pressure 151/83.  Weight is 236.  Head and neck:  Shows a normocephalic, atraumatic skull.  There are no ocular or oral lesions.  There are no palpable cervical or supraclavicular lymph nodes.  Lungs:  Clear to percussion and auscultation bilaterally.  Cardiac:  Regular rate and rhythm with a normal S1 and S2.  There are no murmurs, rubs or bruits. Abdomen:  Soft with good bowel sounds.  There is no palpable abdominal mass.  There is no palpable hepatosplenomegaly.  Back:  No tenderness over the spine, ribs or hips.  There are some slight spasms in the lumbosacral spine.  Extremities:  Show no clubbing, cyanosis or edema. Skin:  Exam does show some slight ruddy complexion to the skin.  LABORATORY STUDIES:  White cell count is 7.4, hemoglobin 15.4, hematocrit 46.9, platelet count 186.  IMPRESSION:  Jacob Bradford is a 52 year old gentleman with polycythemia. He has done very well.  We  have been pretty aggressive with his phlebotomies.  We will go ahead and phlebotomize him now.  I think his last phlebotomy probably was back in September.  He feels better after the phlebotomies.  He is iron deficient so this is not an issue.  We will go ahead and plan to get him back in another couple months.  I did talk to him about the narcotic contract that we are having our patients sign.  I explained this to him.  He certainly had no problems signing it and adhering to the criteria.    ______________________________ Jacob Bradford, M.D. PRE/MEDQ  D:  06/13/2012  T:  06/14/2012  Job:  [id number]

## 2012-06-19 ENCOUNTER — Encounter: Payer: Self-pay | Admitting: Hematology & Oncology

## 2012-07-10 ENCOUNTER — Other Ambulatory Visit: Payer: Self-pay | Admitting: *Deleted

## 2012-07-10 DIAGNOSIS — M47817 Spondylosis without myelopathy or radiculopathy, lumbosacral region: Secondary | ICD-10-CM

## 2012-07-10 DIAGNOSIS — M545 Low back pain: Secondary | ICD-10-CM

## 2012-07-10 MED ORDER — MORPHINE SULFATE 30 MG PO TABS: ORAL_TABLET | ORAL | Status: DC

## 2012-07-10 MED ORDER — OXYMORPHONE HCL ER 20 MG PO TB12: 20.0000 mg | ORAL_TABLET | Freq: Two times a day (BID) | ORAL | Status: DC

## 2012-07-10 NOTE — Telephone Encounter (Signed)
Pt called on Wed. asking to have his rx ready to be picked up on Friday. It was last prescribed on 06/13/12. Will route to Dr Myna Hidalgo for approval then .

## 2012-08-05 ENCOUNTER — Other Ambulatory Visit: Payer: Self-pay | Admitting: Medical

## 2012-08-06 ENCOUNTER — Ambulatory Visit (HOSPITAL_BASED_OUTPATIENT_CLINIC_OR_DEPARTMENT_OTHER): Payer: Managed Care, Other (non HMO) | Admitting: Medical

## 2012-08-06 ENCOUNTER — Other Ambulatory Visit (HOSPITAL_BASED_OUTPATIENT_CLINIC_OR_DEPARTMENT_OTHER): Payer: Managed Care, Other (non HMO) | Admitting: Lab

## 2012-08-06 ENCOUNTER — Ambulatory Visit (HOSPITAL_BASED_OUTPATIENT_CLINIC_OR_DEPARTMENT_OTHER): Payer: Managed Care, Other (non HMO)

## 2012-08-06 VITALS — BP 149/85 | HR 81 | Temp 97.5°F | Resp 18 | Ht 70.0 in | Wt 233.0 lb

## 2012-08-06 DIAGNOSIS — M545 Low back pain: Secondary | ICD-10-CM

## 2012-08-06 DIAGNOSIS — D45 Polycythemia vera: Secondary | ICD-10-CM

## 2012-08-06 DIAGNOSIS — IMO0002 Reserved for concepts with insufficient information to code with codable children: Secondary | ICD-10-CM

## 2012-08-06 LAB — CBC WITH DIFFERENTIAL (CANCER CENTER ONLY)
Eosinophils Absolute: 0.1 10*3/uL (ref 0.0–0.5)
HCT: 49.3 % (ref 38.7–49.9)
LYMPH%: 16.8 % (ref 14.0–48.0)
MCH: 27.6 pg — ABNORMAL LOW (ref 28.0–33.4)
MCV: 83 fL (ref 82–98)
MONO#: 0.7 10*3/uL (ref 0.1–0.9)
MONO%: 8.4 % (ref 0.0–13.0)
NEUT%: 72.4 % (ref 40.0–80.0)
RBC: 5.94 10*6/uL — ABNORMAL HIGH (ref 4.20–5.70)
WBC: 7.9 10*3/uL (ref 4.0–10.0)

## 2012-08-06 MED ORDER — OXYMORPHONE HCL ER 20 MG PO TB12: 20.0000 mg | ORAL_TABLET | Freq: Two times a day (BID) | ORAL | Status: DC

## 2012-08-06 MED ORDER — MORPHINE SULFATE 30 MG PO TABS: ORAL_TABLET | ORAL | Status: DC

## 2012-08-06 NOTE — Progress Notes (Signed)
DIAGNOSES: 1. Polycythemia vera, JAK2 negative. 2. Severe degenerative disk disease of the lumbosacral spine.  CURRENT THERAPY:  Phlebotomy to maintain hematocrit below 45%.  INTERIM HISTORY:   Jacob Bradford presents today for an office followup visit.  Overall, he, reports, that he's doing relatively well.  He continues to stay busy with work.  He is reporting some intermittent headaches.  He states he feels, his blood is a little on the high side because his skin is more red than usual.  His hematocrit is elevated at 49%.  We will give him a phlebotomy today.  He has a decent, appetite.  He denies any nausea, vomiting, diarrhea, constipation, chest pain, shortness breath, or cough.  He denies any fevers, chills, or night sweats.  He denies any obvious, or abnormal bleeding or bruising.  He denies any visual changes, or rashes.  He denies any lower leg swelling.  Of note, his last ferritin was 8.   Review of Systems: Constitutional:Negative for malaise/fatigue, fever, chills, weight loss, diaphoresis, activity change, appetite change, and unexpected weight change.  HEENT: Negative for double vision, blurred vision, visual loss, ear pain, tinnitus, congestion, rhinorrhea, epistaxis sore throat or sinus disease, oral pain/lesion, tongue soreness Respiratory: Negative for cough, chest tightness, shortness of breath, wheezing and stridor.  Cardiovascular: Negative for chest pain, palpitations, leg swelling, orthopnea, PND, DOE or claudication Gastrointestinal: Negative for nausea, vomiting, abdominal pain, diarrhea, constipation, blood in stool, melena, hematochezia, abdominal distention, anal bleeding, rectal pain, anorexia and hematemesis.  Genitourinary: Negative for dysuria, frequency, hematuria,  Musculoskeletal: Negative for myalgias, back pain, joint swelling, arthralgias and gait problem.  Skin: Negative for rash, color change, pallor and wound.  Neurological:. Negative for  dizziness/light-headedness, tremors, seizures, syncope, facial asymmetry, speech difficulty, weakness, numbness, headaches and paresthesias.  Hematological: Negative for adenopathy. Does not bruise/bleed easily.  Psychiatric/Behavioral:  Negative for depression, no loss of interest in normal activity or change in sleep pattern.   Physical Exam: This is a pleasant, 52 year old, well-developed, well-nourished, white gentleman, in no obvious distress Vitals: temperature 97.5 degrees, pulse 81, respirations 18, blood pressure 149 bradycardia 5.  Weight 233 pounds HEENT reveals a normocephalic, atraumatic skull, no scleral icterus, no oral lesions  Neck is supple without any cervical or supraclavicular adenopathy.  Lungs are clear to auscultation bilaterally. There are no wheezes, rales or rhonci Cardiac is regular rate and rhythm with a normal S1 and S2. There are no murmurs, rubs, or bruits.  Abdomen is soft with good bowel sounds, there is no palpable mass. There is no palpable hepatosplenomegaly. There is no palpable fluid wave.  Musculoskeletal no tenderness of the spine, ribs, or hips.  Extremities there are no clubbing, cyanosis, or edema.  Skin no petechia, purpura or ecchymosis Neurologic is nonfocal.  Laboratory Data: White count 7.9, hemoglobin 16.4, hematocrit 49.3, platelets 242,000   Current Outpatient Prescriptions on File Prior to Visit  Medication Sig Dispense Refill  . ALPRAZolam (XANAX) 1 MG tablet Take 1 tablet (1 mg total) by mouth 4 (four) times daily.  30 tablet  0  . aspirin 81 MG tablet Take 81 mg by mouth daily.        . CHANTIX STARTING MONTH PAK 0.5 MG X 11 & 1 MG X 42 tablet ON HOLD TILL 06-04-12      . cholecalciferol (VITAMIN D) 1000 UNITS tablet Take 1,000 Units by mouth daily.      . clotrimazole-betamethasone (LOTRISONE) cream 1 application 2 (two) times daily.       Marland Kitchen  morphine (MSIR) 30 MG tablet Take 1 pill, if needed, for pain every 4-6 hours.  120 tablet  0   . oxymorphone (OPANA ER) 20 MG 12 hr tablet Take 1 tablet (20 mg total) by mouth every 12 (twelve) hours.  60 tablet  0  . sertraline (ZOLOFT) 50 MG tablet 50 mg daily. ON HOLD UNTIL END OF DEC.       No current facility-administered medications on file prior to visit.   Assessment/Plan: This is a pleasant, 52 year old, gentleman, with the following issues:  #1.  Polycythemia vera. JAK2 negative. Marland Kitchen  His hematocrit is above 45%, today, as, such, we will go ahead and phlebotomize him.   He is iron deficient, so that is not an issue.  We will plan to get him back in another couple months.  #2.  Severe degenerative disc disease of the lumbosacral spine.  He is requesting a refill on his narcotics.  I do believe.  He signed a narcotic contract.  I will go ahead and give him a prescription for his MSIR and Opana.  I did advise him that he really needs to followup with a back surgeon.  #3.  Followup.  We will follow back up with Jacob Bradford in 2 months, but before then should there be questions or concerns.

## 2012-08-06 NOTE — Progress Notes (Signed)
Jacob Bradford presents today for phlebotomy per MD orders. Phlebotomy procedure started at 1530 and ended at 1550 500 ml removed. Patient observed for 30 minutes after procedure without any incident. Patient tolerated procedure well. IV needle removed intact.

## 2012-08-06 NOTE — Patient Instructions (Signed)

## 2012-09-10 ENCOUNTER — Other Ambulatory Visit: Payer: Self-pay | Admitting: *Deleted

## 2012-09-10 DIAGNOSIS — M47817 Spondylosis without myelopathy or radiculopathy, lumbosacral region: Secondary | ICD-10-CM

## 2012-09-10 DIAGNOSIS — M545 Low back pain: Secondary | ICD-10-CM

## 2012-09-10 MED ORDER — MORPHINE SULFATE 30 MG PO TABS: ORAL_TABLET | ORAL | Status: DC

## 2012-09-10 MED ORDER — OXYMORPHONE HCL ER 20 MG PO TB12: 20.0000 mg | ORAL_TABLET | Freq: Two times a day (BID) | ORAL | Status: DC

## 2012-09-10 NOTE — Telephone Encounter (Signed)
Pt called on Mon. asking to have his rx ready to be picked up on Wed. It was last prescribed on 08/06/12. Will route to Dr Myna Hidalgo for approval then place at the front desk for pick up.

## 2012-10-09 ENCOUNTER — Other Ambulatory Visit (HOSPITAL_BASED_OUTPATIENT_CLINIC_OR_DEPARTMENT_OTHER): Payer: Managed Care, Other (non HMO) | Admitting: Lab

## 2012-10-09 ENCOUNTER — Ambulatory Visit (HOSPITAL_BASED_OUTPATIENT_CLINIC_OR_DEPARTMENT_OTHER): Payer: Managed Care, Other (non HMO) | Admitting: Hematology & Oncology

## 2012-10-09 ENCOUNTER — Ambulatory Visit (HOSPITAL_BASED_OUTPATIENT_CLINIC_OR_DEPARTMENT_OTHER): Payer: Managed Care, Other (non HMO)

## 2012-10-09 VITALS — BP 144/92 | HR 83

## 2012-10-09 VITALS — BP 149/85 | HR 84 | Temp 97.5°F | Resp 18 | Ht 70.0 in | Wt 235.0 lb

## 2012-10-09 DIAGNOSIS — D45 Polycythemia vera: Secondary | ICD-10-CM

## 2012-10-09 DIAGNOSIS — M47817 Spondylosis without myelopathy or radiculopathy, lumbosacral region: Secondary | ICD-10-CM

## 2012-10-09 DIAGNOSIS — M545 Low back pain: Secondary | ICD-10-CM

## 2012-10-09 DIAGNOSIS — M5137 Other intervertebral disc degeneration, lumbosacral region: Secondary | ICD-10-CM

## 2012-10-09 LAB — CBC WITH DIFFERENTIAL (CANCER CENTER ONLY)
BASO#: 0 10*3/uL (ref 0.0–0.2)
EOS%: 1.2 % (ref 0.0–7.0)
Eosinophils Absolute: 0.1 10*3/uL (ref 0.0–0.5)
HCT: 45.8 % (ref 38.7–49.9)
HGB: 15.1 g/dL (ref 13.0–17.1)
LYMPH#: 1.3 10*3/uL (ref 0.9–3.3)
MCH: 27.8 pg — ABNORMAL LOW (ref 28.0–33.4)
MCHC: 33 g/dL (ref 32.0–35.9)
NEUT#: 7.5 10*3/uL — ABNORMAL HIGH (ref 1.5–6.5)
NEUT%: 77 % (ref 40.0–80.0)
RBC: 5.44 10*6/uL (ref 4.20–5.70)

## 2012-10-09 MED ORDER — MORPHINE SULFATE 30 MG PO TABS: ORAL_TABLET | ORAL | Status: DC

## 2012-10-09 MED ORDER — OXYMORPHONE HCL ER 20 MG PO TB12: 20.0000 mg | ORAL_TABLET | Freq: Two times a day (BID) | ORAL | Status: DC

## 2012-10-09 NOTE — Progress Notes (Signed)
This office note has been dictated.

## 2012-10-09 NOTE — Progress Notes (Signed)
Jacob Bradford presents today for phlebotomy per MD orders. Phlebotomy procedure started at 1515 and ended at 1540. Approximately 500 mls removed. Patient observed for 30 minutes after procedure without any incident. Patient tolerated procedure well. IV needle removed intact.

## 2012-10-09 NOTE — Patient Instructions (Signed)

## 2012-10-11 NOTE — Progress Notes (Signed)
CC:   Marjory Lies, M.D.  DIAGNOSES: 1. Polycythemia vera, JAK2 negative. 2. Severe degenerative disk disease of the lumbosacral spine.  CURRENT THERAPY: 1. Phlebotomy to maintain hematocrit below 45%. 2. Aspirin 81 mg p.o. daily.  INTERIM HISTORY:  Jacob Bradford comes in for followup.  He is doing fairly well.  His back seems to be doing okay.  He is on Opana and MSIR which has helped him out quite a bit.  He is working.  He is functional.  He was last phlebotomized I think back in March.  At that point in time, his hematocrit was 49.3.  We are watching his iron studies.  Back in March, his ferritin was 26.  He has had no headache.  He may be a little bit more fatigued.  He has had no pain in his hands or feet.  He has had no change in bowel or bladder habits.  PHYSICAL EXAMINATION:  General:  This is a well-developed, well- nourished white gentleman in no obvious distress.  Vital signs:  Show a temperature of 97.5, pulse 84, respiratory rate 18, blood pressure 149/85.  Weight is 235.  Head and neck:  Shows a normocephalic, atraumatic skull.  There are no ocular or oral lesions.  There are no palpable cervical or supraclavicular lymph nodes.  Lungs:  Clear bilaterally.  Cardiac:  Regular rate and rhythm with a normal S1 and S2. There are no murmurs, rubs or bruits.  Abdomen:  Soft with good bowel sounds.  There is no palpable abdominal mass.  There is no fluid wave. There is no palpable hepatosplenomegaly.  Extremities:  Show no clubbing, cyanosis or edema.  Neurological:  Shows no focal neurological deficits.  LABORATORY STUDIES:  White cell count is 9.7, hemoglobin 15.1, hematocrit 45.8, platelet count 167.  Ferritin is 6.  IMPRESSION:  Jacob Bradford is a nice 52 year old gentleman with polycythemia.  We are being aggressive with his phlebotomies.  We have been seeing him now probably for about 18 years.  So far, he has had no complications from the polycythemia.  We  will get his hematocrit down today.  By doing a phlebotomy today, I think that we will be able to get him through 2 or 3 months.  I will see him back probably in July or August.    ______________________________ Jacob Bradford, M.D. PRE/MEDQ  D:  10/10/2012  T:  10/11/2012  Job:  [id number]

## 2012-11-04 ENCOUNTER — Other Ambulatory Visit: Payer: Self-pay | Admitting: *Deleted

## 2012-11-04 DIAGNOSIS — M47817 Spondylosis without myelopathy or radiculopathy, lumbosacral region: Secondary | ICD-10-CM

## 2012-11-04 MED ORDER — MORPHINE SULFATE 30 MG PO TABS: ORAL_TABLET | ORAL | Status: DC

## 2012-11-04 MED ORDER — OXYMORPHONE HCL ER 20 MG PO TB12: 20.0000 mg | ORAL_TABLET | Freq: Two times a day (BID) | ORAL | Status: DC

## 2012-11-04 NOTE — Telephone Encounter (Signed)
Pt called on Mon. asking to have his rx ready to be picked up on Wed. It was last prescribed on 10/09/12. Will route to Dr Myna Hidalgo for approval then place at the front desk for pick up.

## 2012-12-01 ENCOUNTER — Other Ambulatory Visit: Payer: Self-pay | Admitting: *Deleted

## 2012-12-01 DIAGNOSIS — M47817 Spondylosis without myelopathy or radiculopathy, lumbosacral region: Secondary | ICD-10-CM

## 2012-12-01 MED ORDER — MORPHINE SULFATE 30 MG PO TABS: ORAL_TABLET | ORAL | Status: DC

## 2012-12-01 MED ORDER — OXYMORPHONE HCL ER 20 MG PO TB12: 20.0000 mg | ORAL_TABLET | Freq: Two times a day (BID) | ORAL | Status: DC

## 2012-12-01 NOTE — Telephone Encounter (Signed)
Pt called on asking to have his Opana and MSIR rx ready to be picked up by Wed. It was last prescribed on 11/04/12. Will route to Dr Myna Hidalgo for approval then place at the front desk for pick up.

## 2012-12-11 ENCOUNTER — Ambulatory Visit (HOSPITAL_BASED_OUTPATIENT_CLINIC_OR_DEPARTMENT_OTHER): Payer: Managed Care, Other (non HMO)

## 2012-12-11 ENCOUNTER — Ambulatory Visit (HOSPITAL_BASED_OUTPATIENT_CLINIC_OR_DEPARTMENT_OTHER): Payer: Managed Care, Other (non HMO) | Admitting: Hematology & Oncology

## 2012-12-11 ENCOUNTER — Other Ambulatory Visit (HOSPITAL_BASED_OUTPATIENT_CLINIC_OR_DEPARTMENT_OTHER): Payer: Managed Care, Other (non HMO) | Admitting: Lab

## 2012-12-11 ENCOUNTER — Other Ambulatory Visit: Payer: Self-pay | Admitting: *Deleted

## 2012-12-11 VITALS — BP 122/68 | HR 71 | Temp 97.6°F | Resp 18 | Ht 70.0 in | Wt 237.0 lb

## 2012-12-11 VITALS — BP 135/90 | HR 73 | Resp 20

## 2012-12-11 DIAGNOSIS — D45 Polycythemia vera: Secondary | ICD-10-CM

## 2012-12-11 LAB — CBC WITH DIFFERENTIAL (CANCER CENTER ONLY)
BASO#: 0.1 10*3/uL (ref 0.0–0.2)
EOS%: 4.5 % (ref 0.0–7.0)
Eosinophils Absolute: 0.2 10*3/uL (ref 0.0–0.5)
HCT: 46.1 % (ref 38.7–49.9)
HGB: 14.9 g/dL (ref 13.0–17.1)
LYMPH#: 1.1 10*3/uL (ref 0.9–3.3)
MCH: 27.5 pg — ABNORMAL LOW (ref 28.0–33.4)
MCHC: 32.3 g/dL (ref 32.0–35.9)
MONO%: 11 % (ref 0.0–13.0)
NEUT%: 63.4 % (ref 40.0–80.0)
RBC: 5.42 10*6/uL (ref 4.20–5.70)

## 2012-12-11 LAB — PSA: PSA: 0.25 ng/mL (ref <=4.00)

## 2012-12-11 LAB — TESTOSTERONE: Testosterone: 110 ng/dL — ABNORMAL LOW (ref 300–890)

## 2012-12-11 NOTE — Progress Notes (Signed)
Jacob Bradford presents today for phlebotomy per MD orders. Phlebotomy procedure started at 1448and ended at 1454. Approximately 500 mls removed. Patient observed for 30 minutes after procedure without any incident. Patient tolerated procedure well. IV needle removed intact.

## 2012-12-11 NOTE — Patient Instructions (Signed)

## 2012-12-11 NOTE — Progress Notes (Signed)
This office note has been dictated.

## 2012-12-12 NOTE — Progress Notes (Signed)
CC:   Evelena Peat, M.D.  DIAGNOSES: 1. Polycythemia vera __________ JAK-2 negative. 2. Severe degenerative disk disease of the lumbosacral spine.  CURRENT THERAPY: 1. Phlebotomy to maintain hematocrit below 45%. 2. Aspirin 81 mg p.o. daily.  INTERVAL HISTORY:  Jacob Bradford comes in for his followup.  He is doing fairly well.  He has had no problems with headache or blurred vision. He has had no cough or shortness of breath.  He is still smoking, but trying to cut back on this.  He has had no bleeding.  There has been no change in bowel or bladder habits.  He has noticed some slight swelling in his feet.  When we last saw him in April, his ferritin was 6.  I think he was last phlebotomized back in May.  PHYSICAL EXAMINATION:  General:  This is a well-developed, well- nourished white gentleman in no obvious distress.  Vital signs:  Show a temperature of 97.6, pulse 71, respiratory rate 18, blood pressure 122/68.  Weight is 237.  Head and neck:  Show normocephalic, atraumatic skull.  There are no ocular or oral lesions.  There are no palpable cervical or supraclavicular lymph nodes.  Lungs:  Clear bilaterally. Cardiac:  Regular rate and rhythm with a normal S1 and S2.  There are no murmurs, rubs or bruits.  Abdomen:  Soft with good bowel sounds.  There is no palpable abdominal mass.  There is no fluid wave.  There is no palpable hepatosplenomegaly.  Extremities:  Show no clubbing, cyanosis, or edema.  Neurological:  Shows no focal neurological deficits.  LABORATORY DATA:  His white blood cell count is 5.3, hemoglobin 14.9, hematocrit 46.1, and platelet count 120.  IMPRESSION:  Jacob Bradford is a nice 52 year old gentleman with polycythemia.  We have been following him now probably for about 18 years.  He has done very well.  I noticed that his platelet count is down a little bit today.  I did look at his smear under the microscope. I did not see anything that was unusual that  would explain why the platelets were on the low side.  We will go ahead and plan to phlebotomize him today.  We will plan to get him back after Labor Day now.  I think we can go every 2 month intervals with him.    ______________________________ Jacob Bradford, M.D. PRE/MEDQ  D:  12/11/2012  T:  12/12/2012  Job:  [id number]

## 2012-12-15 ENCOUNTER — Telehealth: Payer: Self-pay | Admitting: Hematology & Oncology

## 2012-12-15 NOTE — Telephone Encounter (Addendum)
Message copied by Cathi Roan on Mon Dec 15, 2012  1:32 PM ------      Message from: Josph Macho      Created: Fri Dec 12, 2012  5:49 PM       Call - PSA is normal!!! ------  12-15-12   Called and spoke to patient regarding above MD message. Patient understood and had no questions. Lupita Raider LPN

## 2013-01-06 ENCOUNTER — Other Ambulatory Visit: Payer: Self-pay | Admitting: *Deleted

## 2013-01-06 DIAGNOSIS — M47817 Spondylosis without myelopathy or radiculopathy, lumbosacral region: Secondary | ICD-10-CM

## 2013-01-06 MED ORDER — MORPHINE SULFATE 30 MG PO TABS: ORAL_TABLET | ORAL | Status: DC

## 2013-01-06 MED ORDER — OXYMORPHONE HCL ER 20 MG PO TB12: 20.0000 mg | ORAL_TABLET | Freq: Two times a day (BID) | ORAL | Status: DC

## 2013-01-06 NOTE — Telephone Encounter (Signed)
Pt called yesterday asking to have his Opana and MSIR rx ready to be picked up by Tuesday. It was last prescribed on 12/01/12. Will route to Dr Myna Hidalgo for approval then place at the front desk for pick up.

## 2013-02-04 ENCOUNTER — Other Ambulatory Visit: Payer: Self-pay | Admitting: *Deleted

## 2013-02-04 DIAGNOSIS — M47817 Spondylosis without myelopathy or radiculopathy, lumbosacral region: Secondary | ICD-10-CM

## 2013-02-04 MED ORDER — OXYMORPHONE HCL ER 20 MG PO TB12: 20.0000 mg | ORAL_TABLET | Freq: Two times a day (BID) | ORAL | Status: DC

## 2013-02-04 MED ORDER — MORPHINE SULFATE 30 MG PO TABS: ORAL_TABLET | ORAL | Status: DC

## 2013-02-04 NOTE — Telephone Encounter (Signed)
Pt called yesterday asking to have his Opana and MSIR rx ready to be picked up by Thurs. It was last prescribed on 01/06/13. Will route to Dr Myna Hidalgo for approval then place at the front desk for pick up.

## 2013-02-12 ENCOUNTER — Ambulatory Visit (HOSPITAL_BASED_OUTPATIENT_CLINIC_OR_DEPARTMENT_OTHER): Payer: Managed Care, Other (non HMO) | Admitting: Hematology & Oncology

## 2013-02-12 ENCOUNTER — Other Ambulatory Visit (HOSPITAL_BASED_OUTPATIENT_CLINIC_OR_DEPARTMENT_OTHER): Payer: Managed Care, Other (non HMO) | Admitting: Lab

## 2013-02-12 ENCOUNTER — Ambulatory Visit (HOSPITAL_BASED_OUTPATIENT_CLINIC_OR_DEPARTMENT_OTHER): Payer: Managed Care, Other (non HMO)

## 2013-02-12 VITALS — BP 160/93 | HR 91 | Temp 97.9°F | Resp 20 | Wt 235.0 lb

## 2013-02-12 DIAGNOSIS — D45 Polycythemia vera: Secondary | ICD-10-CM

## 2013-02-12 DIAGNOSIS — F17200 Nicotine dependence, unspecified, uncomplicated: Secondary | ICD-10-CM

## 2013-02-12 LAB — CBC WITH DIFFERENTIAL (CANCER CENTER ONLY)
BASO#: 0.1 10*3/uL (ref 0.0–0.2)
BASO%: 0.9 % (ref 0.0–2.0)
EOS%: 5.7 % (ref 0.0–7.0)
HCT: 49 % (ref 38.7–49.9)
LYMPH%: 19.5 % (ref 14.0–48.0)
MCH: 26.7 pg — ABNORMAL LOW (ref 28.0–33.4)
MCHC: 32 g/dL (ref 32.0–35.9)
MCV: 84 fL (ref 82–98)
MONO%: 13.5 % — ABNORMAL HIGH (ref 0.0–13.0)
NEUT%: 60.4 % (ref 40.0–80.0)
RDW: 15.6 % (ref 11.1–15.7)

## 2013-02-12 NOTE — Progress Notes (Signed)
This office note has been dictated.

## 2013-02-12 NOTE — Patient Instructions (Signed)
Therapeutic Phlebotomy Therapeutic phlebotomy is the controlled removal of blood from your body for the purpose of treating a medical condition. It is similar to donating blood. Usually, about a pint (470 mL) of blood is removed. The average adult has 9 to 12 pints (4.3 to 5.7 L) of blood. Therapeutic phlebotomy may be used to treat the following medical conditions:  Hemochromatosis. This is a condition in which there is too much iron in the blood.  Polycythemia vera. This is a condition in which there are too many red cells in the blood.  Porphyria cutanea tarda. This is a disease usually passed from one generation to the next (inherited). It is a condition in which an important part of hemoglobin is not made properly. This results in the build up of abnormal amounts of porphyrins in the body.  Sickle cell disease. This is an inherited disease. It is a condition in which the red blood cells form an abnormal crescent shape rather than a round shape. LET YOUR CAREGIVER KNOW ABOUT:  Allergies.  Medicines taken including herbs, eyedrops, over-the-counter medicines, and creams.  Use of steroids (by mouth or creams).  Previous problems with anesthetics or numbing medicine.  History of blood clots.  History of bleeding or blood problems.  Previous surgery.  Possibility of pregnancy, if this applies. RISKS AND COMPLICATIONS This is a simple and safe procedure. Problems are unlikely. However, problems can occur and may include:  Nausea or lightheadedness.  Low blood pressure.  Soreness, bleeding, swelling, or bruising at the needle insertion site.  Infection. BEFORE THE PROCEDURE  This is a procedure that can be done as an outpatient. Confirm the time that you need to arrive for your procedure. Confirm whether there is a need to fast or withhold any medications. It is helpful to wear clothing with sleeves that can be raised above the elbow. A blood sample may be done to determine the  amount of red blood cells or iron in your blood. Plan ahead of time to have someone drive you home after the procedure. PROCEDURE The entire procedure from preparation through recovery takes about 1 hour. The actual collection takes about 10 to 15 minutes.  A needle will be inserted into your vein.  Tubing and a collection bag will be attached to that needle.  Blood will flow through the needle and tubing into the collection bag.  You may be asked to open and close your hand slowly and continuously during the entire collection.  Once the specified amount of blood has been removed from your body, the collection bag and tubing will be clamped.  The needle will be removed.  Pressure will be held on the site of the needle insertion to stop the bleeding. Then a bandage will be placed over the needle insertion site. AFTER THE PROCEDURE  Your recovery will be assessed and monitored. If there are no problems, as an outpatient, you should be able to go home shortly after the procedure.  Document Released: 10/23/2010 Document Revised: 08/13/2011 Document Reviewed: 10/23/2010 [employer] Patient Information 2014 [employer].  

## 2013-02-12 NOTE — Progress Notes (Signed)
Jacob Bradford presents today for phlebotomy per MD orders. Phlebotomy procedure started at 1320 and ended at 1330. 500 grams removed. Patient observed for 30 minutes after procedure without any incident. Patient tolerated procedure well. IV needle removed intact.

## 2013-02-13 NOTE — Progress Notes (Signed)
CC:   Evelena Peat, M.D.  DIAGNOSES: 1. Polycythemia vera, JAK2 negative. 2. Severe degenerative disk disease of the lumbosacral spine.  CURRENT THERAPY: 1. Phlebotomy to maintain hematocrit below 45%. 2. Aspirin 81 mg p.o. daily.  INTERIM HISTORY:  Jacob Bradford comes in for his followup.  He is doing okay.  He does feel a little bit fatigued.  He has a little bit of a headache.  This typically signals his blood needs to be phlebotomized.  He is still smoking, but trying to cut back on this.  He is working without any problems.  His back is doing okay.  He is on MS-IR and Opana ER for his back, which has helped him out quite a bit.  He has had no numbness or tingling in his hands or feet.  He has had no change in bowel or bladder habits.  There has been no cough.  He has had no chest pain.  He has had no swallowing difficulties.  When we last checked his ferritin back in July, it was 10.  His testosterone is on the low side.  His PSA was 0.25 back in July.  PHYSICAL EXAMINATION:  General:  This is a well-developed, well- nourished white gentleman in no obvious distress.  Vital Signs:  Shows a temperature of 97.9, pulse 91, respiratory rate 20, blood pressure 160/93.  Weight is 235.  Head and Neck:  Shows a normocephalic, atraumatic skull.  There are no ocular or oral lesions.  There are no palpable cervical or supraclavicular lymph nodes.  He has some slight conjunctival inflammation.  Lungs:  Clear with no rales, wheezes, or rhonchi.  Cardiac:  Regular rate and rhythm with a normal S1, S2.  There are no murmurs, rubs, or bruits.  Abdomen:  Soft.  He has good bowel sounds.  There is no palpable abdominal mass.  There is no fluid wave. There is no palpable hepatosplenomegaly.  Back:  No tenderness over the spine, ribs, or hips.  Extremities:  Show no clubbing, cyanosis, or edema.  Skin:  A few ecchymoses are noted on his arms.  Neurological: Shows no focal neurological  deficits.  LABORATORY STUDIES:  White cell count is 6.5, hemoglobin 14.5, hematocrit 43.8, platelet count 256.  IMPRESSION:  Jacob Bradford is a 52 year old gentleman with polycythemia vera.  He is doing well with this.  We will go ahead and phlebotomize him today.  He will let us know if he needs any refills for his prescriptions.  I want him back in 6 weeks' time.  I suspect that he probably will need to be phlebotomized when we see him back.    ______________________________ Josph Macho, M.D. PRE/MEDQ  D:  02/12/2013  T:  02/13/2013  Job:  [id number]

## 2013-03-05 ENCOUNTER — Other Ambulatory Visit: Payer: Self-pay | Admitting: *Deleted

## 2013-03-05 DIAGNOSIS — M47817 Spondylosis without myelopathy or radiculopathy, lumbosacral region: Secondary | ICD-10-CM

## 2013-03-05 MED ORDER — OXYMORPHONE HCL ER 20 MG PO TB12: 20.0000 mg | ORAL_TABLET | Freq: Two times a day (BID) | ORAL | Status: DC

## 2013-03-05 MED ORDER — MORPHINE SULFATE 30 MG PO TABS: ORAL_TABLET | ORAL | Status: DC

## 2013-03-05 NOTE — Telephone Encounter (Signed)
Pt called asking to have his Opana and MSIR refilled tomorrow . It was last prescribed on 02/04/13. Will route to Dr Myna Hidalgo for approval then place at the front desk for pick up.

## 2013-03-20 ENCOUNTER — Other Ambulatory Visit: Payer: Self-pay | Admitting: *Deleted

## 2013-03-20 ENCOUNTER — Ambulatory Visit (HOSPITAL_BASED_OUTPATIENT_CLINIC_OR_DEPARTMENT_OTHER): Payer: Managed Care, Other (non HMO)

## 2013-03-20 ENCOUNTER — Other Ambulatory Visit (HOSPITAL_BASED_OUTPATIENT_CLINIC_OR_DEPARTMENT_OTHER): Payer: Managed Care, Other (non HMO) | Admitting: Lab

## 2013-03-20 DIAGNOSIS — D45 Polycythemia vera: Secondary | ICD-10-CM

## 2013-03-20 LAB — CBC WITH DIFFERENTIAL (CANCER CENTER ONLY)
BASO%: 0.5 % (ref 0.0–2.0)
EOS%: 2.2 % (ref 0.0–7.0)
Eosinophils Absolute: 0.2 10*3/uL (ref 0.0–0.5)
HCT: 46.7 % (ref 38.7–49.9)
LYMPH#: 1.2 10*3/uL (ref 0.9–3.3)
MCHC: 33 g/dL (ref 32.0–35.9)
MONO#: 0.5 10*3/uL (ref 0.1–0.9)
NEUT%: 74.5 % (ref 40.0–80.0)
Platelets: 164 10*3/uL (ref 145–400)
RBC: 5.63 10*6/uL (ref 4.20–5.70)
RDW: 15.9 % — ABNORMAL HIGH (ref 11.1–15.7)

## 2013-03-20 NOTE — Progress Notes (Signed)
Pt called yesterday and spoke to Jacob Bradford about coming in to have his labs checked. The nurse at work said his level was "44". Upon further questioning he didn't know which # she was referring to. Jacob Bradford reviewed this with Dr Myna Hidalgo. To have CBC and Ferritin checked today.

## 2013-03-20 NOTE — Patient Instructions (Signed)

## 2013-03-20 NOTE — Progress Notes (Signed)
Jacob Bradford presents today for phlebotomy per MD orders. Phlebotomy procedure started at 1550 and ended at 38. Approximately 500 mls removed. Patient observed for 30 minutes after procedure without any incident. Patient tolerated procedure well. IV needle removed intact.

## 2013-03-23 LAB — FERRITIN CHCC: Ferritin: 11 ng/ml — ABNORMAL LOW (ref 22–316)

## 2013-03-27 ENCOUNTER — Other Ambulatory Visit: Payer: Self-pay | Admitting: Nurse Practitioner

## 2013-03-27 DIAGNOSIS — D45 Polycythemia vera: Secondary | ICD-10-CM

## 2013-03-30 ENCOUNTER — Other Ambulatory Visit (HOSPITAL_BASED_OUTPATIENT_CLINIC_OR_DEPARTMENT_OTHER): Payer: Managed Care, Other (non HMO) | Admitting: Lab

## 2013-03-30 ENCOUNTER — Ambulatory Visit (HOSPITAL_BASED_OUTPATIENT_CLINIC_OR_DEPARTMENT_OTHER): Payer: Managed Care, Other (non HMO) | Admitting: Hematology & Oncology

## 2013-03-30 VITALS — BP 141/73 | HR 90 | Temp 97.3°F | Resp 18 | Ht 70.0 in | Wt 235.0 lb

## 2013-03-30 DIAGNOSIS — D45 Polycythemia vera: Secondary | ICD-10-CM

## 2013-03-30 DIAGNOSIS — M47817 Spondylosis without myelopathy or radiculopathy, lumbosacral region: Secondary | ICD-10-CM

## 2013-03-30 LAB — CBC WITH DIFFERENTIAL (CANCER CENTER ONLY)
EOS%: 2.8 % (ref 0.0–7.0)
Eosinophils Absolute: 0.2 10*3/uL (ref 0.0–0.5)
HGB: 14 g/dL (ref 13.0–17.1)
MCH: 27 pg — ABNORMAL LOW (ref 28.0–33.4)
MCHC: 32 g/dL (ref 32.0–35.9)
MONO%: 6 % (ref 0.0–13.0)
NEUT#: 6.2 10*3/uL (ref 1.5–6.5)
Platelets: 208 10*3/uL (ref 145–400)
RBC: 5.19 10*6/uL (ref 4.20–5.70)

## 2013-03-30 MED ORDER — OXYMORPHONE HCL ER 20 MG PO TB12: 20.0000 mg | ORAL_TABLET | Freq: Two times a day (BID) | ORAL | Status: DC

## 2013-03-30 MED ORDER — MORPHINE SULFATE 30 MG PO TABS: ORAL_TABLET | ORAL | Status: DC

## 2013-03-30 NOTE — Progress Notes (Signed)
This office note has been dictated.

## 2013-03-30 NOTE — Addendum Note (Signed)
Addended by: Arlan Organ R on: 03/30/2013 03:46 PM   Modules accepted: Orders, Medications

## 2013-03-31 LAB — FERRITIN CHCC: Ferritin: 8 ng/ml — ABNORMAL LOW (ref 22–316)

## 2013-03-31 NOTE — Progress Notes (Signed)
CC:   Evelena Peat, M.D.  DIAGNOSES: 1. Polycythemia vera -- JAK2 negative. 2. Severe degenerative disk disease of the lumbosacral spine.  CURRENT THERAPY: 1. Phlebotomy to maintain hematocrit below 45%. 2. Aspirin 81 mg p.o. daily.  INTERIM HISTORY:  Mr. Obey comes in for his followup.  He has been having some bruising lately.  We last had him back just about 10 days ago.  The bruising comes and goes he says.  He is on baby aspirin.  He has felt a little bit tired.  He is still working and getting through work okay.  He has still failed to go back on smoking and doing a good job with this.  We have made him iron-deficient.  So far, his ferritin was 11 back 2 weeks ago.  PHYSICAL EXAMINATION:  General:  This is a well-developed, well- nourished white gentleman in no obvious distress.  Vital signs: Temperature of 97.3, pulse 90, respiratory rate 18, blood pressure 141/73.  Weight is 235 pounds.  Head and neck:  Normocephalic, atraumatic skull.  There are no ocular or oral lesions.  Neck:  There are no palpable cervical or supraclavicular lymph nodes.  Lungs:  Clear bilaterally.  Cardiac:  Regular rate and rhythm with a normal S1 and S2. There are no murmurs, rubs or bruits.  Abdomen:  Soft.  He has good bowel sounds.  There is no fluid wave.  There is no palpable hepatosplenomegaly.  Back exam:  No tenderness over the spine, ribs, or hips.  Extremities:  Show no clubbing, cyanosis or edema.  Skin:  Shows no obvious ecchymoses.  LABORATORY STUDIES:  White cell count 8.4, hemoglobin 14, hematocrit 43.7, platelet count 208.  MCV is 84.  IMPRESSION:  Mr. Bernard is a nice 52 year old gentleman with polycythemia.  He does not need to be phlebotomized right now.  I suspect that if we get him back in 6 weeks, he probably will need to be phlebotomized.  I think the bruising may come and go with him.  Again, he is on aspirin. This certainly could potentiate some  bruising.  We will get him back in 6 weeks.    ______________________________ Josph Macho, M.D. PRE/MEDQ  D:  03/30/2013  T:  03/31/2013  Job:  [id number]

## 2013-05-04 ENCOUNTER — Other Ambulatory Visit: Payer: Self-pay | Admitting: Nurse Practitioner

## 2013-05-04 DIAGNOSIS — M47817 Spondylosis without myelopathy or radiculopathy, lumbosacral region: Secondary | ICD-10-CM

## 2013-05-04 MED ORDER — OXYMORPHONE HCL ER 20 MG PO TB12: 20.0000 mg | ORAL_TABLET | Freq: Two times a day (BID) | ORAL | Status: DC

## 2013-05-04 MED ORDER — MORPHINE SULFATE 30 MG PO TABS: ORAL_TABLET | ORAL | Status: DC

## 2013-05-11 ENCOUNTER — Ambulatory Visit (HOSPITAL_BASED_OUTPATIENT_CLINIC_OR_DEPARTMENT_OTHER): Payer: Managed Care, Other (non HMO) | Admitting: Hematology & Oncology

## 2013-05-11 ENCOUNTER — Other Ambulatory Visit (HOSPITAL_BASED_OUTPATIENT_CLINIC_OR_DEPARTMENT_OTHER): Payer: Managed Care, Other (non HMO) | Admitting: Lab

## 2013-05-11 VITALS — BP 120/67 | HR 60 | Temp 97.3°F | Resp 18 | Ht 70.0 in | Wt 237.0 lb

## 2013-05-11 DIAGNOSIS — D45 Polycythemia vera: Secondary | ICD-10-CM

## 2013-05-11 LAB — CBC WITH DIFFERENTIAL (CANCER CENTER ONLY)
BASO#: 0.1 10*3/uL (ref 0.0–0.2)
EOS%: 5 % (ref 0.0–7.0)
Eosinophils Absolute: 0.3 10*3/uL (ref 0.0–0.5)
HGB: 13.9 g/dL (ref 13.0–17.1)
LYMPH#: 1.3 10*3/uL (ref 0.9–3.3)
MCH: 26.6 pg — ABNORMAL LOW (ref 28.0–33.4)
MCHC: 31.7 g/dL — ABNORMAL LOW (ref 32.0–35.9)
MCV: 84 fL (ref 82–98)
NEUT#: 3.7 10*3/uL (ref 1.5–6.5)
RBC: 5.23 10*6/uL (ref 4.20–5.70)

## 2013-05-11 LAB — IRON AND TIBC CHCC
Iron: 37 ug/dL — ABNORMAL LOW (ref 42–163)
TIBC: 515 ug/dL — ABNORMAL HIGH (ref 202–409)
UIBC: 478 ug/dL — ABNORMAL HIGH (ref 117–376)

## 2013-05-11 LAB — FERRITIN CHCC: Ferritin: 8 ng/ml — ABNORMAL LOW (ref 22–316)

## 2013-05-11 NOTE — Progress Notes (Signed)
This office note has been dictated.

## 2013-05-13 NOTE — Progress Notes (Signed)
CC:   Jacob Bradford, M.D.  DIAGNOSES: 1. 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 2. Severe degenerative disk disease of the lumbosacral spine.  CURRENT THERAPY: 1. Phlebotomy to maintain hematocrit below 45%. 2. Aspirin 81 mg p.o. daily.  INTERIM HISTORY:  Jacob Bradford comes in for followup.  He is doing okay. He now has a girlfriend.  His wife passed away a couple of years ago. This was incredibly hard on him, but he now is starting to make a "come back."  He is much happier.  She is a very nice woman that he met.  He is enjoying the relationship right now.  As far as his polycythemia goes, he has been doing well with this.  I do not think he has probably been phlebotomized since October.  He has had no problems with headache.  He has had no cough or shortness of breath.  He has had no change in bowel or bladder habits.  He is doing well with his pain medications.  He is on Opana ER and MSIR, which have really been quite helpful for his back.  PHYSICAL EXAMINATION:  General:  This is a well-developed, well- nourished white gentleman, in no obvious distress.  Vital signs: Temperature of 97.2, pulse of 60, respiratory rate 18, blood pressure 120/67, weight is 237 pounds.  Head and Neck:  Normocephalic, atraumatic skull.  There are no ocular or oral lesions.  There is no palpable cervical or supraclavicular lymph nodes.  Lungs:  Clear bilaterally. Cardiac:  Regular rate and rhythm with a normal S1, S2.  There are no murmurs, rubs, or bruits.  Abdomen is soft.  He has good bowel sounds. There is no fluid wave.  There is no palpable hepatosplenomegaly. Extremities:  No clubbing, cyanosis, or edema.  Neurological:  No focal neurological deficits.  LABORATORY STUDIES:  White cell count is 6, hemoglobin 14, hematocrit 44, platelet count is 142.  MCV is 84.  IMPRESSION:  Jacob Bradford is a very nice 52 year old gentleman with polycythemia.  He is JAK2 negative.  However, he has done  incredibly well.  We have been seeing him now probably for about 17 or 18 years.  We will go ahead and plan to get him back in another 6-8 weeks.  I suspect we might need to phlebotomize him then.    ______________________________ Jacob Bradford, M.D. PRE/MEDQ  D:  05/11/2013  T:  05/12/2013  Job:  [id number]

## 2013-06-01 ENCOUNTER — Other Ambulatory Visit: Payer: Self-pay | Admitting: Nurse Practitioner

## 2013-06-01 DIAGNOSIS — M47817 Spondylosis without myelopathy or radiculopathy, lumbosacral region: Secondary | ICD-10-CM

## 2013-06-01 MED ORDER — OXYMORPHONE HCL ER 20 MG PO TB12: 20.0000 mg | ORAL_TABLET | Freq: Two times a day (BID) | ORAL | Status: DC

## 2013-06-01 MED ORDER — MORPHINE SULFATE 30 MG PO TABS: ORAL_TABLET | ORAL | Status: DC

## 2013-06-29 ENCOUNTER — Other Ambulatory Visit: Payer: Self-pay | Admitting: *Deleted

## 2013-06-29 DIAGNOSIS — M47817 Spondylosis without myelopathy or radiculopathy, lumbosacral region: Secondary | ICD-10-CM

## 2013-06-29 MED ORDER — MORPHINE SULFATE 30 MG PO TABS: ORAL_TABLET | ORAL | Status: DC

## 2013-06-29 MED ORDER — OXYMORPHONE HCL ER 20 MG PO TB12: 20.0000 mg | ORAL_TABLET | Freq: Two times a day (BID) | ORAL | Status: DC

## 2013-07-13 ENCOUNTER — Ambulatory Visit (HOSPITAL_BASED_OUTPATIENT_CLINIC_OR_DEPARTMENT_OTHER): Payer: Managed Care, Other (non HMO)

## 2013-07-13 ENCOUNTER — Other Ambulatory Visit (HOSPITAL_BASED_OUTPATIENT_CLINIC_OR_DEPARTMENT_OTHER): Payer: Managed Care, Other (non HMO) | Admitting: Lab

## 2013-07-13 ENCOUNTER — Ambulatory Visit (HOSPITAL_BASED_OUTPATIENT_CLINIC_OR_DEPARTMENT_OTHER): Payer: Managed Care, Other (non HMO) | Admitting: Hematology & Oncology

## 2013-07-13 ENCOUNTER — Encounter: Payer: Self-pay | Admitting: Hematology & Oncology

## 2013-07-13 VITALS — BP 142/88 | HR 71 | Resp 20

## 2013-07-13 VITALS — BP 170/83 | HR 78 | Temp 97.9°F | Resp 18 | Ht 70.0 in | Wt 240.0 lb

## 2013-07-13 DIAGNOSIS — D45 Polycythemia vera: Secondary | ICD-10-CM

## 2013-07-13 LAB — CBC WITH DIFFERENTIAL (CANCER CENTER ONLY)
BASO#: 0.1 10*3/uL (ref 0.0–0.2)
BASO%: 0.8 % (ref 0.0–2.0)
EOS%: 4.6 % (ref 0.0–7.0)
Eosinophils Absolute: 0.4 10*3/uL (ref 0.0–0.5)
HEMATOCRIT: 46.8 % (ref 38.7–49.9)
HGB: 14.3 g/dL (ref 13.0–17.1)
LYMPH#: 1.3 10*3/uL (ref 0.9–3.3)
LYMPH%: 17.5 % (ref 14.0–48.0)
MCH: 25.5 pg — AB (ref 28.0–33.4)
MCHC: 30.6 g/dL — ABNORMAL LOW (ref 32.0–35.9)
MCV: 83 fL (ref 82–98)
MONO#: 0.7 10*3/uL (ref 0.1–0.9)
MONO%: 9.1 % (ref 0.0–13.0)
NEUT#: 5.2 10*3/uL (ref 1.5–6.5)
NEUT%: 68 % (ref 40.0–80.0)
Platelets: 172 10*3/uL (ref 145–400)
RBC: 5.61 10*6/uL (ref 4.20–5.70)
RDW: 18.6 % — AB (ref 11.1–15.7)
WBC: 7.6 10*3/uL (ref 4.0–10.0)

## 2013-07-13 NOTE — Patient Instructions (Signed)

## 2013-07-13 NOTE — Progress Notes (Signed)
Jacob Bradford presents today for phlebotomy per MD orders. Phlebotomy procedure started at 1513 and ended at 1520. 500 ml removed. Patient observed for 30 minutes after procedure without any incident. Patient tolerated procedure well. IV needle removed intact.

## 2013-07-13 NOTE — Progress Notes (Signed)
Hematology and Oncology Follow Up Visit  Jacob Bradford [id number] [birth year] 53 y.o. 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 He enjoy the holidays. He has a girlfriend now. He went to her house down in [place]. Generally the time. They actually are living together now.  I don't think it he's been phlebotomized probably for good for 5 months.  He's working. He is doing okay at work. He's not having any issues with increased back pain. He is on pain medication which is helping him out quite a bit.  He's had no headache. He's had a pain in his hands or feet. He's had no rashes. He's had no change in bowel or bladder habits.  He is still smoking but try to cut back.  Medications: Current outpatient prescriptions:ALPRAZolam (XANAX) 1 MG tablet, Take 1 tablet (1 mg total) by mouth 4 (four) times daily., Disp: 30 tablet, Rfl: 0;  aspirin 81 MG tablet, Take 81 mg by mouth daily.  , Disp: , Rfl: ;  morphine (MSIR) 30 MG tablet, Take 1 pill, if needed, for pain every 4-6 hours., Disp: 120 tablet, Rfl: 0 oxymorphone (OPANA ER) 20 MG 12 hr tablet, Take 1 tablet (20 mg total) by mouth every 12 (twelve) hours., Disp: 60 tablet, Rfl: 0;  VIAGRA 50 MG tablet, , Disp: , Rfl: ;  Vitamin D, Cholecalciferol, 1000 UNITS TABS, Take by mouth every morning., Disp: , Rfl:   Allergies: No Known Allergies  Past Medical History, Surgical history, Social history, and Family History were reviewed and updated.  Review of Systems: As above  Physical Exam:  height is 5\' 10"  (1.778 m) and weight is 240 lb (108.863 kg). His oral temperature is 97.9 F (36.6 C). His blood pressure is 170/83 and his pulse is 78. His respiration is 18.   A somewhat obese white gentleman in no  obvious distress. His head exam shows no ocular or oral lesions. He has no facial plethora. There is no adenopathy in his neck. Thyroid is nonpalpable. Lungs are clear. Cardiac exam regular rate and rhythm with no murmurs rubs or bruits. Abdomen is soft. Somewhat obese. He has good bowel sounds. There is no palpable hepatosplenomegaly. Extremities shows no clubbing cyanosis or edema. He has no swelling in his joints. Skin exam no rashes. Back exam no tenderness over the spine. Neurological exam shows no focal neurological deficits. Lab Results  Component Value Date   WBC 7.6 07/13/2013   HGB 14.3 07/13/2013   HCT 46.8 07/13/2013   MCV 83 07/13/2013   PLT 172 07/13/2013     Chemistry   No results found for this basename: NA, K, CL, CO2, BUN, CREATININE, GLU   No results found for this basename: CALCIUM, ALKPHOS, AST, ALT, BILITOT         Impression and Plan: Jacob Bradford is a 53 year old gentleman with polycythemia. This is well-controlled. He will need to be phlebotomized today.  He will continue his aspirin.  Will plan to get him back in another 6 weeks or so.  He clearly is iron deficient. His ferritin has a was been below 10.    Volanda Napoleon, MD 2/9/20156:48 PM

## 2013-07-13 NOTE — Patient Instructions (Signed)
You Can Quit Smoking If you are ready to quit smoking or are thinking about it, congratulations! You have chosen to help yourself be healthier and live longer! There are lots of different ways to quit smoking. Nicotine gum, nicotine patches, a nicotine inhaler, or nicotine nasal spray can help with physical craving. Hypnosis, support groups, and medicines help break the habit of smoking. TIPS TO GET OFF AND STAY OFF CIGARETTES  Learn to predict your moods. Do not let a bad situation be your excuse to have a cigarette. Some situations in your life might tempt you to have a cigarette.  Ask friends and co-workers not to smoke around you.  Make your home smoke-free.  Never have "just one" cigarette. It leads to wanting another and another. Remind yourself of your decision to quit.  On a card, make a list of your reasons for not smoking. Read it at least the same number of times a day as you have a cigarette. Tell yourself everyday, "I do not want to smoke. I choose not to smoke."  Ask someone at home or work to help you with your plan to quit smoking.  Have something planned after you eat or have a cup of coffee. Take a walk or get other exercise to perk you up. This will help to keep you from overeating.  Try a relaxation exercise to calm you down and decrease your stress. Remember, you may be tense and nervous the first two weeks after you quit. This will pass.  Find new activities to keep your hands busy. Play with a pen, coin, or rubber band. Doodle or draw things on paper.  Brush your teeth right after eating. This will help cut down the craving for the taste of tobacco after meals. You can try mouthwash too.  Try gum, breath mints, or diet candy to keep something in your mouth. IF YOU SMOKE AND WANT TO QUIT:  Do not stock up on cigarettes. Never buy a carton. Wait until one pack is finished before you buy another.  Never carry cigarettes with you at work or at home.  Keep cigarettes  as far away from you as possible. Leave them with someone else.  Never carry matches or a lighter with you.  Ask yourself, "Do I need this cigarette or is this just a reflex?"  Bet with someone that you can quit. Put cigarette money in a piggy bank every morning. If you smoke, you give up the money. If you do not smoke, by the end of the week, you keep the money.  Keep trying. It takes 21 days to change a habit!  Talk to your doctor about using medicines to help you quit. These include nicotine replacement gum, lozenges, or skin patches. Document Released: 03/17/2009 Document Revised: 08/13/2011 Document Reviewed: 03/17/2009 [employer] Patient Information 2014 [employer].  

## 2013-07-28 ENCOUNTER — Other Ambulatory Visit: Payer: Self-pay | Admitting: Nurse Practitioner

## 2013-07-28 DIAGNOSIS — M47817 Spondylosis without myelopathy or radiculopathy, lumbosacral region: Secondary | ICD-10-CM

## 2013-07-28 MED ORDER — OXYMORPHONE HCL ER 20 MG PO TB12: 20.0000 mg | ORAL_TABLET | Freq: Two times a day (BID) | ORAL | Status: DC

## 2013-07-28 MED ORDER — MORPHINE SULFATE 30 MG PO TABS: ORAL_TABLET | ORAL | Status: DC

## 2013-08-24 ENCOUNTER — Ambulatory Visit (HOSPITAL_BASED_OUTPATIENT_CLINIC_OR_DEPARTMENT_OTHER): Payer: Managed Care, Other (non HMO) | Admitting: Hematology & Oncology

## 2013-08-24 ENCOUNTER — Ambulatory Visit (HOSPITAL_BASED_OUTPATIENT_CLINIC_OR_DEPARTMENT_OTHER): Payer: Managed Care, Other (non HMO)

## 2013-08-24 ENCOUNTER — Encounter: Payer: Self-pay | Admitting: Hematology & Oncology

## 2013-08-24 ENCOUNTER — Other Ambulatory Visit (HOSPITAL_BASED_OUTPATIENT_CLINIC_OR_DEPARTMENT_OTHER): Payer: Managed Care, Other (non HMO) | Admitting: Lab

## 2013-08-24 VITALS — BP 146/86 | HR 88 | Resp 16

## 2013-08-24 VITALS — BP 153/81 | HR 83 | Temp 97.5°F | Resp 18 | Ht 70.0 in | Wt 241.0 lb

## 2013-08-24 DIAGNOSIS — M549 Dorsalgia, unspecified: Secondary | ICD-10-CM

## 2013-08-24 DIAGNOSIS — D45 Polycythemia vera: Secondary | ICD-10-CM

## 2013-08-24 DIAGNOSIS — F17200 Nicotine dependence, unspecified, uncomplicated: Secondary | ICD-10-CM

## 2013-08-24 DIAGNOSIS — M47817 Spondylosis without myelopathy or radiculopathy, lumbosacral region: Secondary | ICD-10-CM

## 2013-08-24 LAB — CMP (CANCER CENTER ONLY)
ALBUMIN: 3.4 g/dL (ref 3.3–5.5)
ALK PHOS: 77 U/L (ref 26–84)
ALT: 34 U/L (ref 10–47)
AST: 18 U/L (ref 11–38)
BUN, Bld: 14 mg/dL (ref 7–22)
CO2: 28 mEq/L (ref 18–33)
Calcium: 8.4 mg/dL (ref 8.0–10.3)
Chloride: 102 mEq/L (ref 98–108)
Creat: 0.9 mg/dl (ref 0.6–1.2)
Glucose, Bld: 149 mg/dL — ABNORMAL HIGH (ref 73–118)
POTASSIUM: 3.2 meq/L — AB (ref 3.3–4.7)
Sodium: 138 mEq/L (ref 128–145)
TOTAL PROTEIN: 7.1 g/dL (ref 6.4–8.1)
Total Bilirubin: 0.8 mg/dl (ref 0.20–1.60)

## 2013-08-24 LAB — CBC WITH DIFFERENTIAL (CANCER CENTER ONLY)
BASO#: 0 10*3/uL (ref 0.0–0.2)
BASO%: 0.4 % (ref 0.0–2.0)
EOS%: 3.2 % (ref 0.0–7.0)
Eosinophils Absolute: 0.3 10*3/uL (ref 0.0–0.5)
HEMATOCRIT: 47.4 % (ref 38.7–49.9)
HGB: 15.1 g/dL (ref 13.0–17.1)
LYMPH#: 1.3 10*3/uL (ref 0.9–3.3)
LYMPH%: 13.6 % — ABNORMAL LOW (ref 14.0–48.0)
MCH: 26.3 pg — ABNORMAL LOW (ref 28.0–33.4)
MCHC: 31.9 g/dL — ABNORMAL LOW (ref 32.0–35.9)
MCV: 82 fL (ref 82–98)
MONO#: 0.5 10*3/uL (ref 0.1–0.9)
MONO%: 4.8 % (ref 0.0–13.0)
NEUT#: 7.7 10*3/uL — ABNORMAL HIGH (ref 1.5–6.5)
NEUT%: 78 % (ref 40.0–80.0)
PLATELETS: 209 10*3/uL (ref 145–400)
RBC: 5.75 10*6/uL — ABNORMAL HIGH (ref 4.20–5.70)
RDW: 17.9 % — AB (ref 11.1–15.7)
WBC: 9.8 10*3/uL (ref 4.0–10.0)

## 2013-08-24 MED ORDER — MORPHINE SULFATE 30 MG PO TABS: ORAL_TABLET | ORAL | Status: DC

## 2013-08-24 MED ORDER — OXYMORPHONE HCL ER 20 MG PO TB12: 20.0000 mg | ORAL_TABLET | Freq: Two times a day (BID) | ORAL | Status: DC
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
## 2013-08-24 NOTE — Patient Instructions (Signed)
You Can Quit Smoking If you are ready to quit smoking or are thinking about it, congratulations! You have chosen to help yourself be healthier and live longer! There are lots of different ways to quit smoking. Nicotine gum, nicotine patches, a nicotine inhaler, or nicotine nasal spray can help with physical craving. Hypnosis, support groups, and medicines help break the habit of smoking. TIPS TO GET OFF AND STAY OFF CIGARETTES  Learn to predict your moods. Do not let a bad situation be your excuse to have a cigarette. Some situations in your life might tempt you to have a cigarette.  Ask friends and co-workers not to smoke around you.  Make your home smoke-free.  Never have "just one" cigarette. It leads to wanting another and another. Remind yourself of your decision to quit.  On a card, make a list of your reasons for not smoking. Read it at least the same number of times a day as you have a cigarette. Tell yourself everyday, "I do not want to smoke. I choose not to smoke."  Ask someone at home or work to help you with your plan to quit smoking.  Have something planned after you eat or have a cup of coffee. Take a walk or get other exercise to perk you up. This will help to keep you from overeating.  Try a relaxation exercise to calm you down and decrease your stress. Remember, you may be tense and nervous the first two weeks after you quit. This will pass.  Find new activities to keep your hands busy. Play with a pen, coin, or rubber band. Doodle or draw things on paper.  Brush your teeth right after eating. This will help cut down the craving for the taste of tobacco after meals. You can try mouthwash too.  Try gum, breath mints, or diet candy to keep something in your mouth. IF YOU SMOKE AND WANT TO QUIT:  Do not stock up on cigarettes. Never buy a carton. Wait until one pack is finished before you buy another.  Never carry cigarettes with you at work or at home.  Keep cigarettes  as far away from you as possible. Leave them with someone else.  Never carry matches or a lighter with you.  Ask yourself, "Do I need this cigarette or is this just a reflex?"  Bet with someone that you can quit. Put cigarette money in a piggy bank every morning. If you smoke, you give up the money. If you do not smoke, by the end of the week, you keep the money.  Keep trying. It takes 21 days to change a habit!  Talk to your doctor about using medicines to help you quit. These include nicotine replacement gum, lozenges, or skin patches. Document Released: 03/17/2009 Document Revised: 08/13/2011 Document Reviewed: 03/17/2009 [employer] Patient Information 2014 [employer].  

## 2013-08-24 NOTE — Patient Instructions (Signed)
Therapeutic Phlebotomy Therapeutic phlebotomy is the controlled removal of blood from your body for the purpose of treating a medical condition. It is similar to donating blood. Usually, about a pint (470 mL) of blood is removed. The average adult has 9 to 12 pints (4.3 to 5.7 L) of blood. Therapeutic phlebotomy may be used to treat the following medical conditions:  Hemochromatosis. This is a condition in which there is too much iron in the blood.  Polycythemia vera. This is a condition in which there are too many red cells in the blood.  Porphyria cutanea tarda. This is a disease usually passed from one generation to the next (inherited). It is a condition in which an important part of hemoglobin is not made properly. This results in the build up of abnormal amounts of porphyrins in the body.  Sickle cell disease. This is an inherited disease. It is a condition in which the red blood cells form an abnormal crescent shape rather than a round shape. LET YOUR CAREGIVER KNOW ABOUT:  Allergies.  Medicines taken including herbs, eyedrops, over-the-counter medicines, and creams.  Use of steroids (by mouth or creams).  Previous problems with anesthetics or numbing medicine.  History of blood clots.  History of bleeding or blood problems.  Previous surgery.  Possibility of pregnancy, if this applies. RISKS AND COMPLICATIONS This is a simple and safe procedure. Problems are unlikely. However, problems can occur and may include:  Nausea or lightheadedness.  Low blood pressure.  Soreness, bleeding, swelling, or bruising at the needle insertion site.  Infection. BEFORE THE PROCEDURE  This is a procedure that can be done as an outpatient. Confirm the time that you need to arrive for your procedure. Confirm whether there is a need to fast or withhold any medications. It is helpful to wear clothing with sleeves that can be raised above the elbow. A blood sample may be done to determine the  amount of red blood cells or iron in your blood. Plan ahead of time to have someone drive you home after the procedure. PROCEDURE The entire procedure from preparation through recovery takes about 1 hour. The actual collection takes about 10 to 15 minutes.  A needle will be inserted into your vein.  Tubing and a collection bag will be attached to that needle.  Blood will flow through the needle and tubing into the collection bag.  You may be asked to open and close your hand slowly and continuously during the entire collection.  Once the specified amount of blood has been removed from your body, the collection bag and tubing will be clamped.  The needle will be removed.  Pressure will be held on the site of the needle insertion to stop the bleeding. Then a bandage will be placed over the needle insertion site. AFTER THE PROCEDURE  Your recovery will be assessed and monitored. If there are no problems, as an outpatient, you should be able to go home shortly after the procedure.  Document Released: 10/23/2010 Document Revised: 08/13/2011 Document Reviewed: 10/23/2010 [employer] Patient Information 2014 [employer].  

## 2013-08-24 NOTE — Progress Notes (Signed)
Jacob Bradford presents today for phlebotomy per MD orders. Phlebotomy procedure started at 1545 and ended at 1600. 533mls removed. Patient observed for 30 minutes after procedure without any incident. Patient tolerated procedure well. IV needle removed intact.

## 2013-08-25 LAB — IRON AND TIBC CHCC
%SAT: 10 % — AB (ref 20–55)
Iron: 51 ug/dL (ref 42–163)
TIBC: 533 ug/dL — ABNORMAL HIGH (ref 202–409)
UIBC: 482 ug/dL — AB (ref 117–376)

## 2013-08-25 LAB — FERRITIN CHCC: FERRITIN: 11 ng/mL — AB (ref 22–316)

## 2013-09-25 ENCOUNTER — Other Ambulatory Visit: Payer: Self-pay | Admitting: Nurse Practitioner

## 2013-09-25 DIAGNOSIS — M47817 Spondylosis without myelopathy or radiculopathy, lumbosacral region: Secondary | ICD-10-CM

## 2013-09-25 MED ORDER — ASPIRIN 81 MG PO TABS: 81.0000 mg | ORAL_TABLET | Freq: Every day | ORAL | Status: DC

## 2013-09-25 MED ORDER — OXYMORPHONE HCL ER 20 MG PO TB12: 20.0000 mg | ORAL_TABLET | Freq: Two times a day (BID) | ORAL | Status: DC

## 2013-09-28 ENCOUNTER — Other Ambulatory Visit: Payer: Self-pay | Admitting: Nurse Practitioner

## 2013-09-28 DIAGNOSIS — M545 Low back pain, unspecified: Secondary | ICD-10-CM

## 2013-09-28 DIAGNOSIS — M47817 Spondylosis without myelopathy or radiculopathy, lumbosacral region: Secondary | ICD-10-CM

## 2013-09-28 MED ORDER — MORPHINE SULFATE 30 MG PO TABS: ORAL_TABLET | ORAL | Status: DC

## 2013-09-28 MED ORDER — ALPRAZOLAM 1 MG PO TABS: 1.0000 mg | ORAL_TABLET | Freq: Four times a day (QID) | ORAL | Status: DC

## 2013-10-07 ENCOUNTER — Telehealth: Payer: Self-pay | Admitting: Hematology & Oncology

## 2013-10-07 ENCOUNTER — Ambulatory Visit: Payer: Managed Care, Other (non HMO) | Admitting: Hematology & Oncology

## 2013-10-07 ENCOUNTER — Other Ambulatory Visit: Payer: Managed Care, Other (non HMO) | Admitting: Lab

## 2013-10-07 NOTE — Telephone Encounter (Signed)
Pt called moved 5-6 to 5-10

## 2013-10-27 ENCOUNTER — Other Ambulatory Visit: Payer: Self-pay | Admitting: *Deleted

## 2013-10-27 DIAGNOSIS — M47817 Spondylosis without myelopathy or radiculopathy, lumbosacral region: Secondary | ICD-10-CM

## 2013-10-28 ENCOUNTER — Other Ambulatory Visit: Payer: Self-pay | Admitting: *Deleted

## 2013-10-28 DIAGNOSIS — M47817 Spondylosis without myelopathy or radiculopathy, lumbosacral region: Secondary | ICD-10-CM

## 2013-10-28 MED ORDER — MORPHINE SULFATE 30 MG PO TABS: ORAL_TABLET | ORAL | Status: DC

## 2013-10-28 MED ORDER — OXYMORPHONE HCL ER 20 MG PO TB12: 20.0000 mg | ORAL_TABLET | Freq: Two times a day (BID) | ORAL | Status: DC

## 2013-11-11 ENCOUNTER — Telehealth: Payer: Self-pay | Admitting: Hematology & Oncology

## 2013-11-11 ENCOUNTER — Encounter: Payer: Self-pay | Admitting: Hematology & Oncology

## 2013-11-11 ENCOUNTER — Ambulatory Visit (HOSPITAL_BASED_OUTPATIENT_CLINIC_OR_DEPARTMENT_OTHER): Payer: Managed Care, Other (non HMO)

## 2013-11-11 ENCOUNTER — Other Ambulatory Visit (HOSPITAL_BASED_OUTPATIENT_CLINIC_OR_DEPARTMENT_OTHER): Payer: Managed Care, Other (non HMO) | Admitting: Lab

## 2013-11-11 ENCOUNTER — Ambulatory Visit (HOSPITAL_BASED_OUTPATIENT_CLINIC_OR_DEPARTMENT_OTHER): Payer: Managed Care, Other (non HMO) | Admitting: Hematology & Oncology

## 2013-11-11 VITALS — BP 127/86 | HR 81 | Resp 20

## 2013-11-11 VITALS — BP 142/82 | HR 87 | Temp 97.6°F | Resp 18 | Ht 70.0 in | Wt 232.0 lb

## 2013-11-11 DIAGNOSIS — D45 Polycythemia vera: Secondary | ICD-10-CM

## 2013-11-11 DIAGNOSIS — F17200 Nicotine dependence, unspecified, uncomplicated: Secondary | ICD-10-CM

## 2013-11-11 DIAGNOSIS — C61 Malignant neoplasm of prostate: Secondary | ICD-10-CM

## 2013-11-11 DIAGNOSIS — M47817 Spondylosis without myelopathy or radiculopathy, lumbosacral region: Secondary | ICD-10-CM

## 2013-11-11 LAB — CBC WITH DIFFERENTIAL (CANCER CENTER ONLY)
BASO#: 0.1 10*3/uL (ref 0.0–0.2)
BASO%: 0.8 % (ref 0.0–2.0)
EOS ABS: 0.3 10*3/uL (ref 0.0–0.5)
EOS%: 3.6 % (ref 0.0–7.0)
HCT: 46.9 % (ref 38.7–49.9)
HEMOGLOBIN: 15.5 g/dL (ref 13.0–17.1)
LYMPH#: 0.9 10*3/uL (ref 0.9–3.3)
LYMPH%: 11.5 % — ABNORMAL LOW (ref 14.0–48.0)
MCH: 27.5 pg — AB (ref 28.0–33.4)
MCHC: 33 g/dL (ref 32.0–35.9)
MCV: 83 fL (ref 82–98)
MONO#: 0.6 10*3/uL (ref 0.1–0.9)
MONO%: 7.8 % (ref 0.0–13.0)
NEUT%: 76.3 % (ref 40.0–80.0)
NEUTROS ABS: 5.8 10*3/uL (ref 1.5–6.5)
Platelets: 169 10*3/uL (ref 145–400)
RBC: 5.63 10*6/uL (ref 4.20–5.70)
RDW: 16 % — ABNORMAL HIGH (ref 11.1–15.7)
WBC: 7.6 10*3/uL (ref 4.0–10.0)

## 2013-11-11 MED ORDER — MORPHINE SULFATE 30 MG PO TABS: ORAL_TABLET | ORAL | Status: DC

## 2013-11-11 MED ORDER — OXYMORPHONE HCL ER 20 MG PO TB12: 20.0000 mg | ORAL_TABLET | Freq: Two times a day (BID) | ORAL | Status: DC

## 2013-11-11 NOTE — Telephone Encounter (Signed)
[employer] - [employer]  [id number] PR PHLEBOTOMY  Polycythemia vera (238.4) - Primary 238.4

## 2013-11-11 NOTE — Patient Instructions (Signed)

## 2013-11-13 ENCOUNTER — Encounter: Payer: Self-pay | Admitting: Nurse Practitioner

## 2013-11-17 NOTE — Progress Notes (Signed)
Hematology and Oncology Follow Up Visit  Jacob Bradford [id number] 1960/12/30 53 y.o. 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 He's been doing fairly well. He's working. He has had no problems with headache. He's had no cough. He's had no shortness of breath. There's been no abdominal pain.  His chronic low back pain has been very well controlled with medication. He is very functional. He's working. He is to enjoy a good quality of life.  He was last phlebotomized 3 months ago.  We have made him iron deficient.  He is still smoking. He's trying to cut back. I talked about this.    Medications: Current outpatient prescriptions:ALPRAZolam (XANAX) 1 MG tablet, Take 1 tablet (1 mg total) by mouth 4 (four) times daily., Disp: 30 tablet, Rfl: 0;  aspirin 81 MG tablet, Take 1 tablet (81 mg total) by mouth daily., Disp: 30 tablet, Rfl: 12;  [START ON 11/25/2013] morphine (MSIR) 30 MG tablet, Take 1 pill, if needed, for pain every 4-6 hours.  Original prescription printed in hospital. Had to re-do prescription., Disp: 120 tablet, Rfl: 0 [START ON 11/25/2013] oxymorphone (OPANA ER) 20 MG 12 hr tablet, Take 1 tablet (20 mg total) by mouth every 12 (twelve) hours. Original prescription printed in hospital. Had to re-do prescription., Disp: 60 tablet, Rfl: 0  Allergies: No Known Allergies  Past Medical History, Surgical history, Social history, and Family History were reviewed and updated.  Review of Systems: As above  Physical Exam:  height is 5\' 10"  (1.778 m) and weight is 232 lb (105.235 kg). His oral temperature is 97.6 F (36.4 C). His blood pressure is 142/82 and his pulse is 87. His respiration is 18.   Well-developed well-nourished white  gentleman. There are no ocular or oral lesions. He has no palpable cervical or supraclavicular lymph nodes. Lungs are clear. Cardiac exam regular in rhythm with no murmurs rubs or bruits. Abdomen is soft. She has good bowel sounds. There is no palpable liver or spleen tip. Back exam no tenderness over the spine ribs or hips. Extremities shows no clubbing cyanosis or edema. Skin exam no rashes. Neurological exam is nonfocal.  Lab Results  Component Value Date   WBC 7.6 11/11/2013   HGB 15.5 11/11/2013   HCT 46.9 11/11/2013   MCV 83 11/11/2013   PLT 169 11/11/2013     Chemistry      Component Value Date/Time   NA 138 08/24/2013 1401   K 3.2* 08/24/2013 1401   CL 102 08/24/2013 1401   CO2 28 08/24/2013 1401   BUN 14 08/24/2013 1401   CREATININE 0.9 08/24/2013 1401      Component Value Date/Time   CALCIUM 8.4 08/24/2013 1401   ALKPHOS 77 08/24/2013 1401   AST 18 08/24/2013 1401   ALT 34 08/24/2013 1401   BILITOT 0.80 08/24/2013 1401         Impression and Plan: Jacob Bradford is 53 year old gentleman with polycythemia. He is  JAK2 negative. He has had no problems with this.  We will go ahead and phlebotomize him now.  We will now give him back in another 2 or 3 months.    Volanda Napoleon, MD 6/16/20157:11 AM

## 2013-12-23 ENCOUNTER — Other Ambulatory Visit: Payer: Self-pay | Admitting: *Deleted

## 2013-12-23 DIAGNOSIS — M47817 Spondylosis without myelopathy or radiculopathy, lumbosacral region: Secondary | ICD-10-CM

## 2013-12-23 MED ORDER — OXYMORPHONE HCL ER 20 MG PO TB12: 20.0000 mg | ORAL_TABLET | Freq: Two times a day (BID) | ORAL | Status: DC

## 2013-12-23 MED ORDER — MORPHINE SULFATE 30 MG PO TABS: ORAL_TABLET | ORAL | Status: DC

## 2014-01-21 ENCOUNTER — Other Ambulatory Visit: Payer: Self-pay | Admitting: *Deleted

## 2014-01-21 DIAGNOSIS — M47817 Spondylosis without myelopathy or radiculopathy, lumbosacral region: Secondary | ICD-10-CM

## 2014-01-21 MED ORDER — OXYMORPHONE HCL ER 20 MG PO TB12: 20.0000 mg | ORAL_TABLET | Freq: Two times a day (BID) | ORAL | Status: DC

## 2014-01-21 MED ORDER — MORPHINE SULFATE 30 MG PO TABS: ORAL_TABLET | ORAL | Status: DC

## 2014-02-03 ENCOUNTER — Other Ambulatory Visit: Payer: Managed Care, Other (non HMO) | Admitting: Lab

## 2014-02-03 ENCOUNTER — Ambulatory Visit: Payer: Managed Care, Other (non HMO) | Admitting: Family

## 2014-02-17 ENCOUNTER — Telehealth: Payer: Self-pay | Admitting: Hematology & Oncology

## 2014-02-17 NOTE — Telephone Encounter (Signed)
Patient called and resch 02/03/14 missed apt for 03/01/14

## 2014-02-22 ENCOUNTER — Other Ambulatory Visit: Payer: Self-pay | Admitting: *Deleted

## 2014-02-22 DIAGNOSIS — M47817 Spondylosis without myelopathy or radiculopathy, lumbosacral region: Secondary | ICD-10-CM

## 2014-02-22 MED ORDER — MORPHINE SULFATE 30 MG PO TABS
ORAL_TABLET | ORAL | Status: DC
Start: 2014-02-22 — End: 2014-03-23

## 2014-02-22 MED ORDER — OXYMORPHONE HCL ER 20 MG PO TB12: 20.0000 mg | ORAL_TABLET | Freq: Two times a day (BID) | ORAL | Status: DC

## 2014-03-01 ENCOUNTER — Ambulatory Visit (HOSPITAL_BASED_OUTPATIENT_CLINIC_OR_DEPARTMENT_OTHER): Payer: Managed Care, Other (non HMO)

## 2014-03-01 ENCOUNTER — Encounter: Payer: Self-pay | Admitting: Family

## 2014-03-01 ENCOUNTER — Ambulatory Visit (HOSPITAL_BASED_OUTPATIENT_CLINIC_OR_DEPARTMENT_OTHER): Payer: Managed Care, Other (non HMO) | Admitting: Family

## 2014-03-01 ENCOUNTER — Other Ambulatory Visit (HOSPITAL_BASED_OUTPATIENT_CLINIC_OR_DEPARTMENT_OTHER): Payer: Managed Care, Other (non HMO) | Admitting: Lab

## 2014-03-01 VITALS — BP 175/85 | HR 94 | Temp 98.1°F | Resp 18 | Ht 70.0 in | Wt 230.0 lb

## 2014-03-01 VITALS — BP 175/84 | HR 90 | Resp 20

## 2014-03-01 DIAGNOSIS — Z23 Encounter for immunization: Secondary | ICD-10-CM

## 2014-03-01 DIAGNOSIS — M545 Low back pain, unspecified: Secondary | ICD-10-CM

## 2014-03-01 DIAGNOSIS — D45 Polycythemia vera: Secondary | ICD-10-CM

## 2014-03-01 DIAGNOSIS — C61 Malignant neoplasm of prostate: Secondary | ICD-10-CM

## 2014-03-01 DIAGNOSIS — M47817 Spondylosis without myelopathy or radiculopathy, lumbosacral region: Secondary | ICD-10-CM

## 2014-03-01 DIAGNOSIS — G8929 Other chronic pain: Secondary | ICD-10-CM

## 2014-03-01 LAB — CMP (CANCER CENTER ONLY)
ALBUMIN: 3.4 g/dL (ref 3.3–5.5)
ALK PHOS: 88 U/L — AB (ref 26–84)
ALT(SGPT): 20 U/L (ref 10–47)
AST: 19 U/L (ref 11–38)
BUN, Bld: 14 mg/dL (ref 7–22)
CO2: 21 mEq/L (ref 18–33)
CREATININE: 1.2 mg/dL (ref 0.6–1.2)
Calcium: 8 mg/dL (ref 8.0–10.3)
Chloride: 106 mEq/L (ref 98–108)
Glucose, Bld: 161 mg/dL — ABNORMAL HIGH (ref 73–118)
POTASSIUM: 3.5 meq/L (ref 3.3–4.7)
Sodium: 138 mEq/L (ref 128–145)
Total Bilirubin: 0.6 mg/dl (ref 0.20–1.60)
Total Protein: 6.8 g/dL (ref 6.4–8.1)

## 2014-03-01 LAB — CBC WITH DIFFERENTIAL (CANCER CENTER ONLY)
BASO#: 0.1 10*3/uL (ref 0.0–0.2)
BASO%: 0.9 % (ref 0.0–2.0)
EOS ABS: 0.3 10*3/uL (ref 0.0–0.5)
EOS%: 3.9 % (ref 0.0–7.0)
HCT: 45.9 % (ref 38.7–49.9)
HGB: 15 g/dL (ref 13.0–17.1)
LYMPH#: 1.6 10*3/uL (ref 0.9–3.3)
LYMPH%: 20.1 % (ref 14.0–48.0)
MCH: 27.9 pg — ABNORMAL LOW (ref 28.0–33.4)
MCHC: 32.7 g/dL (ref 32.0–35.9)
MCV: 86 fL (ref 82–98)
MONO#: 0.7 10*3/uL (ref 0.1–0.9)
MONO%: 9.1 % (ref 0.0–13.0)
NEUT#: 5.1 10*3/uL (ref 1.5–6.5)
NEUT%: 66 % (ref 40.0–80.0)
Platelets: 132 10*3/uL — ABNORMAL LOW (ref 145–400)
RBC: 5.37 10*6/uL (ref 4.20–5.70)
RDW: 17 % — ABNORMAL HIGH (ref 11.1–15.7)
WBC: 7.8 10*3/uL (ref 4.0–10.0)

## 2014-03-01 MED ORDER — INFLUENZA VAC SPLIT QUAD 0.5 ML IM SUSY
0.5000 mL | PREFILLED_SYRINGE | Freq: Once | INTRAMUSCULAR | Status: AC
  Administered 2014-03-01: 0.5 mL via INTRAMUSCULAR
  Filled 2014-03-01: qty 0.5

## 2014-03-01 NOTE — Patient Instructions (Addendum)
Therapeutic Phlebotomy, Care After Refer to this sheet in the next few weeks. These instructions provide you with information on caring for yourself after your procedure. Your caregiver may also give you more specific instructions. Your treatment has been planned according to current medical practices, but problems sometimes occur. Call your caregiver if you have any problems or questions after your procedure. HOME CARE INSTRUCTIONS Most people can go back to their normal activities right away. Before you leave, be sure to ask if there is anything you should or should not do. In general, it would be wise to:  Keep the bandage dry. You can remove the bandage after about 5 hours.  Eat well-balanced meals for the next 24 hours.  Drink enough fluids to keep your urine clear or pale yellow.  Avoid drinking alcohol minimally until after eating.  Avoid smoking for at least 30 minutes after the procedure.  Avoid strenuous physical activity or heavy lifting or pulling for about 5 hours after the procedure.  Athletes should avoid strenuous exercise for 12 hours or more.  Change positions slowly for the remainder of the day to prevent light-headedness or fainting.  If you feel light-headed, lie down until the feeling subsides.  If you have bleeding from the needle insertion site, elevate your arm and press firmly on the site until the bleeding stops.  If bruising or bleeding appears under the skin, apply ice to the area for 15 to 20 minutes, 3 to 4 times per day. Put the ice in a plastic bag and place a towel between the bag of ice and your skin. Do this while you are awake for the first 24 hours. The ice packs can be stopped before 24 hours if the swelling goes away. If swelling persists after 24 hours, a warm, moist washcloth can be applied to the area for 15 to 20 minutes, 3 to 4 times per day. The warm, moist treatments can be stopped when the swelling goes away.  It is important to continue  further therapeutic phlebotomy as directed by your caregiver. SEEK MEDICAL CARE IF:  There is bleeding or fluid leaking from the needle insertion site.  The needle insertion site becomes swollen, red, or sore.  You feel light-headed, dizzy or nauseated, and the feeling does not go away.  You notice new bruising at the needle insertion site.  You feel more weak or tired than normal.  You develop a fever. SEEK IMMEDIATE MEDICAL CARE IF:   There is increased bleeding, pain, or swelling from the needle insertion site.  You have severe nausea or vomiting.  You have chest pain.  You have trouble breathing. MAKE SURE YOU:  Understand these instructions.  Will watch your condition.  Will get help right away if you are not doing well or get worse. Document Released: 10/23/2010 Document Revised: 10/05/2013 Document Reviewed: 10/23/2010 [facility] Patient Information 2015 [place], [place]. This information is not intended to replace advice given to you by your health care provider. Make sure you discuss any questions you have with your health care provider. Influenza Virus Vaccine injection (Fluarix) What is this medicine? INFLUENZA VIRUS VACCINE (in floo EN zuh VAHY ruhs vak SEEN) helps to reduce the risk of getting influenza also known as the flu. This medicine may be used for other purposes; ask your health care provider or pharmacist if you have questions. COMMON BRAND NAME(S): Fluarix, Fluzone What should I tell my health care provider before I take this medicine? They need to know if  you have any of these conditions: -bleeding disorder like hemophilia -fever or infection -Guillain-Barre syndrome or other neurological problems -immune system problems -infection with the human immunodeficiency virus (HIV) or AIDS -low blood platelet counts -multiple sclerosis -an unusual or allergic reaction to influenza virus vaccine, eggs, chicken proteins, latex, gentamicin, other medicines,  foods, dyes or preservatives -pregnant or trying to get pregnant -breast-feeding How should I use this medicine? This vaccine is for injection into a muscle. It is given by a health care professional. A copy of Vaccine Information Statements will be given before each vaccination. Read this sheet carefully each time. The sheet may change frequently. Talk to your pediatrician regarding the use of this medicine in children. Special care may be needed. Overdosage: If you think you have taken too much of this medicine contact a poison control center or emergency room at once. NOTE: This medicine is only for you. Do not share this medicine with others. What if I miss a dose? This does not apply. What may interact with this medicine? -chemotherapy or radiation therapy -medicines that lower your immune system like etanercept, anakinra, infliximab, and adalimumab -medicines that treat or prevent blood clots like warfarin -phenytoin -steroid medicines like prednisone or cortisone -theophylline -vaccines This list may not describe all possible interactions. Give your health care provider a list of all the medicines, herbs, non-prescription drugs, or dietary supplements you use. Also tell them if you smoke, drink alcohol, or use illegal drugs. Some items may interact with your medicine. What should I watch for while using this medicine? Report any side effects that do not go away within 3 days to your doctor or health care professional. Call your health care provider if any unusual symptoms occur within 6 weeks of receiving this vaccine. You may still catch the flu, but the illness is not usually as bad. You cannot get the flu from the vaccine. The vaccine will not protect against colds or other illnesses that may cause fever. The vaccine is needed every year. What side effects may I notice from receiving this medicine? Side effects that you should report to your doctor or health care professional as  soon as possible: -allergic reactions like skin rash, itching or hives, swelling of the face, lips, or tongue Side effects that usually do not require medical attention (report to your doctor or health care professional if they continue or are bothersome): -fever -headache -muscle aches and pains -pain, tenderness, redness, or swelling at site where injected -weak or tired This list may not describe all possible side effects. Call your doctor for medical advice about side effects. You may report side effects to FDA at 1-800-FDA-1088. Where should I keep my medicine? This vaccine is only given in a clinic, pharmacy, doctor's office, or other health care setting and will not be stored at home. NOTE: This sheet is a summary. It may not cover all possible information. If you have questions about this medicine, talk to your doctor, pharmacist, or health care provider.  2015, [employer]. (2007-12-17 09:30:40)

## 2014-03-01 NOTE — Progress Notes (Signed)
Jacob Bradford presents today for phlebotomy per MD orders. Phlebotomy procedure started at 1528 and ended at 1535. Approximately 500 ms removed. Patient observed for 30 minutes after procedure without any incident. Patient tolerated procedure well. IV needle removed intact.

## 2014-03-01 NOTE — Progress Notes (Signed)
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 He is doing well. He states that he feels like he may need phlebotomized. He has a metal taste in his mouth and numbness/tingling in his extremities. He denies fever, chills, n/v, cough, rash, headache, dizziness, SOB, chest pain, abdominal pain, constipation, diarrhea, blood in urine or stool. He has had no swelling or tenderness in his extremities. He has had no bleeding or pain. His chronic low back pain has been very well controlled with medication. He is very functional. He's working. He is to enjoy a good quality of life. He is asking that his Opana be changed from concave pills to convex. Dr. Marin Olp is going to check into this for him and see what can be done. His last phlebotomy was in June. He is now only smoking a vaporizer and not cigarettes. His appetite is good and he is drinking plenty of fluids.   CURRENT TREATMENT: Phlebotomy to maintain hematocrit below 45%  Aspirin 81 mg by mouth daily  REVIEW OF SYSTEMS: All other 10 point review of systems is negative.   PAST MEDICAL HISTORY: No past medical history on file.  PAST SURGICAL HISTORY: No past surgical history on file.  FAMILY HISTORY No family history on file.  GYNECOLOGIC HISTORY:  No LMP for male patient.   SOCIAL HISTORY:  History   Social History  . Marital Status: Married    Spouse Name: N/A    Number of Children: N/A  . Years of Education: N/A   Occupational History  . Not on file.   Social History Main Topics  . Smoking status: Former Smoker -- 1.00 packs/day for 32 years    Types: Cigarettes    Start date: 07/14/1979   Quit date: 10/11/2013  . Smokeless tobacco: Never Used     Comment: 9 24-15  now using vapor cigarettes  . Alcohol Use: Not on file  . Drug Use: Not on file  . Sexual Activity: Not on file   Other Topics Concern  . Not on file   Social History Narrative  . No narrative on file   ADVANCED DIRECTIVES: <no information>  HEALTH MAINTENANCE: History  Substance Use Topics  . Smoking status: Former Smoker -- 1.00 packs/day for 32 years    Types: Cigarettes    Start date: 07/14/1979    Quit date: 10/11/2013  . Smokeless tobacco: Never Used     Comment: 9 24-15  now using vapor cigarettes  . Alcohol Use: Not on file   Colonoscopy: PAP: Bone density: Lipid panel:  No Known Allergies  Current Outpatient Prescriptions  Medication Sig Dispense Refill  . ALPRAZolam (XANAX) 1 MG tablet Take 1 tablet (1 mg total) by mouth 4 (four) times daily.  30 tablet  0  . aspirin 81 MG tablet Take 1 tablet (81 mg total) by mouth daily.  30 tablet  12  . morphine (MSIR) 30 MG tablet Take 1 pill, as needed, for pain every 4-6 hours  120 tablet  0  . oxymorphone (OPANA ER) 20 MG 12 hr tablet Take 20 mg by mouth every 12 (twelve) hours. PT WILL NOT TAKE GENERIC BRAND, THEY DO NOT GIVE HIM RELIEF.  No current facility-administered medications for this visit.   Facility-Administered Medications Ordered in Other Visits  Medication Dose Route Frequency Provider Last Rate Last Dose  . Influenza vac split quadrivalent PF (FLUARIX) injection 0.5 mL  0.5 mL Intramuscular Once Volanda Napoleon, MD       OBJECTIVE: Filed Vitals:   03/01/14 1453  BP: 175/85  Pulse: 94  Temp: 98.1 F (36.7 C)  Resp: 18   Body mass index is 33 kg/(m^2). ECOG FS:1 - Symptomatic but completely ambulatory Ocular: Sclerae unicteric, pupils equal, round and reactive to light Ear-nose-throat: Oropharynx clear, dentition fair Lymphatic: No cervical or supraclavicular adenopathy Lungs no rales or rhonchi, good excursion  bilaterally Heart regular rate and rhythm, no murmur appreciated Abd soft, nontender, positive bowel sounds MSK no focal spinal tenderness, no joint edema Neuro: non-focal, well-oriented, appropriate affect  LAB RESULTS: CMP     Component Value Date/Time   NA 138 03/01/2014 1436   K 3.5 03/01/2014 1436   CL 106 03/01/2014 1436   CO2 21 03/01/2014 1436   GLUCOSE 161* 03/01/2014 1436   BUN 14 03/01/2014 1436   CREATININE 1.2 03/01/2014 1436   CALCIUM 8.0 03/01/2014 1436   PROT 6.8 03/01/2014 1436   AST 19 03/01/2014 1436   ALT 20 03/01/2014 1436   ALKPHOS 88* 03/01/2014 1436   BILITOT 0.60 03/01/2014 1436   I No results found for this basename: SPEP, UPEP,  kappa and lambda light chains   Lab Results  Component Value Date   WBC 7.8 03/01/2014   NEUTROABS 5.1 03/01/2014   HGB 15.0 03/01/2014   HCT 45.9 03/01/2014   MCV 86 03/01/2014   PLT 132* 03/01/2014   No results found for this basename: LABCA2   No components found with this basename: LABCA125   No results found for this basename: INR,  in the last 168 hours Urinalysis No results found for this basename: colorurine, appearanceur, labspec, phurine, glucoseu, hgbur, bilirubinur, ketonesur, proteinur, urobilinogen, nitrite, leukocytesur   STUDIES: No results found.  ASSESSMENT/PLAN: Jacob Bradford is 53 year old gentleman with polycythemia. He is JAK2 negative. He has had no problems with this.  We will go ahead and phlebotomize him today. His Hct today is 45.9. We will wait and see what his iron studies show. We will see him back in January after the holidays for labs and follow-up. He knows to call here with any questions or concerns and to go to the ED in the event of an emergency. We can certainly see him sooner if need be.   Eliezer Bottom, NP 03/01/2014 3:58 PM

## 2014-03-02 LAB — IRON AND TIBC CHCC
%SAT: 7 % — ABNORMAL LOW (ref 20–55)
IRON: 34 ug/dL — AB (ref 42–163)
TIBC: 494 ug/dL — ABNORMAL HIGH (ref 202–409)
UIBC: 460 ug/dL — ABNORMAL HIGH (ref 117–376)

## 2014-03-02 LAB — FERRITIN CHCC: Ferritin: 11 ng/ml — ABNORMAL LOW (ref 22–316)

## 2014-03-02 LAB — PSA: PSA: 0.78 ng/mL (ref <=4.00)

## 2014-03-23 ENCOUNTER — Other Ambulatory Visit: Payer: Self-pay | Admitting: *Deleted

## 2014-03-23 DIAGNOSIS — M47817 Spondylosis without myelopathy or radiculopathy, lumbosacral region: Secondary | ICD-10-CM

## 2014-03-23 MED ORDER — MORPHINE SULFATE 30 MG PO TABS: ORAL_TABLET | ORAL | Status: DC

## 2014-03-23 MED ORDER — OXYMORPHONE HCL ER 20 MG PO TB12: 20.0000 mg | ORAL_TABLET | Freq: Two times a day (BID) | ORAL | Status: DC

## 2014-04-20 ENCOUNTER — Other Ambulatory Visit: Payer: Self-pay | Admitting: *Deleted

## 2014-04-20 DIAGNOSIS — M47817 Spondylosis without myelopathy or radiculopathy, lumbosacral region: Secondary | ICD-10-CM

## 2014-04-20 MED ORDER — OXYMORPHONE HCL ER 20 MG PO TB12: 20.0000 mg | ORAL_TABLET | Freq: Two times a day (BID) | ORAL | Status: DC

## 2014-04-20 MED ORDER — MORPHINE SULFATE 30 MG PO TABS: ORAL_TABLET | ORAL | Status: DC

## 2014-05-19 ENCOUNTER — Other Ambulatory Visit: Payer: Self-pay | Admitting: *Deleted

## 2014-05-19 DIAGNOSIS — M47817 Spondylosis without myelopathy or radiculopathy, lumbosacral region: Secondary | ICD-10-CM

## 2014-05-19 MED ORDER — OXYMORPHONE HCL ER 20 MG PO TB12: 20.0000 mg | ORAL_TABLET | Freq: Two times a day (BID) | ORAL | Status: DC

## 2014-05-19 MED ORDER — MORPHINE SULFATE 30 MG PO TABS: ORAL_TABLET | ORAL | Status: DC

## 2014-06-14 ENCOUNTER — Other Ambulatory Visit (HOSPITAL_BASED_OUTPATIENT_CLINIC_OR_DEPARTMENT_OTHER): Payer: Managed Care, Other (non HMO) | Admitting: Lab

## 2014-06-14 ENCOUNTER — Ambulatory Visit (HOSPITAL_BASED_OUTPATIENT_CLINIC_OR_DEPARTMENT_OTHER): Payer: Managed Care, Other (non HMO) | Admitting: Hematology & Oncology

## 2014-06-14 ENCOUNTER — Ambulatory Visit: Payer: Managed Care, Other (non HMO)

## 2014-06-14 ENCOUNTER — Encounter: Payer: Self-pay | Admitting: Hematology & Oncology

## 2014-06-14 VITALS — BP 165/81 | HR 86 | Temp 97.7°F | Resp 18 | Ht 70.0 in | Wt 237.0 lb

## 2014-06-14 DIAGNOSIS — R51 Headache: Secondary | ICD-10-CM | POA: Diagnosis not present

## 2014-06-14 DIAGNOSIS — M47817 Spondylosis without myelopathy or radiculopathy, lumbosacral region: Secondary | ICD-10-CM

## 2014-06-14 DIAGNOSIS — D45 Polycythemia vera: Secondary | ICD-10-CM | POA: Diagnosis not present

## 2014-06-14 DIAGNOSIS — R202 Paresthesia of skin: Secondary | ICD-10-CM

## 2014-06-14 LAB — CBC WITH DIFFERENTIAL (CANCER CENTER ONLY)
BASO#: 0 10*3/uL (ref 0.0–0.2)
BASO%: 0.5 % (ref 0.0–2.0)
EOS%: 4.5 % (ref 0.0–7.0)
Eosinophils Absolute: 0.4 10*3/uL (ref 0.0–0.5)
HEMATOCRIT: 48.3 % (ref 38.7–49.9)
HEMOGLOBIN: 15.7 g/dL (ref 13.0–17.1)
LYMPH#: 1.6 10*3/uL (ref 0.9–3.3)
LYMPH%: 19.1 % (ref 14.0–48.0)
MCH: 28.1 pg (ref 28.0–33.4)
MCHC: 32.5 g/dL (ref 32.0–35.9)
MCV: 87 fL (ref 82–98)
MONO#: 0.5 10*3/uL (ref 0.1–0.9)
MONO%: 5.8 % (ref 0.0–13.0)
NEUT#: 5.7 10*3/uL (ref 1.5–6.5)
NEUT%: 70.1 % (ref 40.0–80.0)
PLATELETS: 252 10*3/uL (ref 145–400)
RBC: 5.58 10*6/uL (ref 4.20–5.70)
RDW: 15.5 % (ref 11.1–15.7)
WBC: 8.2 10*3/uL (ref 4.0–10.0)

## 2014-06-14 LAB — CMP (CANCER CENTER ONLY)
ALBUMIN: 3.4 g/dL (ref 3.3–5.5)
ALK PHOS: 84 U/L (ref 26–84)
ALT(SGPT): 24 U/L (ref 10–47)
AST: 22 U/L (ref 11–38)
BUN, Bld: 13 mg/dL (ref 7–22)
CALCIUM: 9 mg/dL (ref 8.0–10.3)
CHLORIDE: 100 meq/L (ref 98–108)
CO2: 29 meq/L (ref 18–33)
Creat: 1.1 mg/dl (ref 0.6–1.2)
GLUCOSE: 138 mg/dL — AB (ref 73–118)
Potassium: 4.3 mEq/L (ref 3.3–4.7)
SODIUM: 141 meq/L (ref 128–145)
Total Bilirubin: 0.6 mg/dl (ref 0.20–1.60)
Total Protein: 7 g/dL (ref 6.4–8.1)

## 2014-06-14 MED ORDER — OXYMORPHONE HCL ER 20 MG PO TB12: 20.0000 mg | ORAL_TABLET | Freq: Two times a day (BID) | ORAL | Status: DC

## 2014-06-14 MED ORDER — MORPHINE SULFATE 30 MG PO TABS: ORAL_TABLET | ORAL | Status: DC

## 2014-06-14 NOTE — Progress Notes (Signed)
Jacob Bradford presents today for phlebotomy per MD orders. Phlebotomy procedure started at 1625 and ended at 1630. 166mL removed. Patient observed for 15 minutes after procedure without any incident. Pt unable to give 580mL. Patient tolerated procedure well. IV needle removed intact. Pt will return for future appt to phlebotomize more.

## 2014-06-14 NOTE — Progress Notes (Signed)
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 He has a headache and numbness/tingling in his extremities.  He denies fever, chills, n/v, cough, rash, dizziness, SOB, chest pain, abdominal pain, constipation, diarrhea, blood in urine or stool. He has had no swelling or tenderness in his extremities. No bleeding. His chronic low back pain has been very well controlled with medication. He's working. He is to enjoy a good quality of life. He is now only smoking a vaporizer and not cigarettes. His appetite is good and he is drinking plenty of fluids. His weight is stable at 237 lbs.   CURRENT TREATMENT: Phlebotomy to maintain hematocrit below 45%  Aspirin 81 mg by mouth daily  REVIEW OF SYSTEMS: All other 10 point review of systems is negative.   PAST MEDICAL HISTORY: No past medical history on file.  PAST SURGICAL HISTORY: No past surgical history on file.  FAMILY HISTORY No family history on file.  GYNECOLOGIC HISTORY:  No LMP for male patient.   SOCIAL HISTORY:  History   Social History  . Marital Status: Married    Spouse Name: N/A    Number of Children: N/A  . Years of Education: N/A   Occupational History  . Not on file.   Social History Main Topics  . Smoking status: Former Smoker -- 1.00 packs/day for 32 years    Types: Cigarettes    Start date: 07/14/1979    Quit date: 10/11/2013  . Smokeless tobacco: Never Used     Comment: 06-2014  still using vapor cigarettes  . Alcohol Use: Not on file  . Drug Use: Not on file  . Sexual Activity: Not on file   Other Topics Concern  . Not on file   Social History Narrative   ADVANCED  DIRECTIVES: <no information>  HEALTH MAINTENANCE: History  Substance Use Topics  . Smoking status: Former Smoker -- 1.00 packs/day for 32 years    Types: Cigarettes    Start date: 07/14/1979    Quit date: 10/11/2013  . Smokeless tobacco: Never Used     Comment: 06-2014  still using vapor cigarettes  . Alcohol Use: Not on file   Colonoscopy: PAP: Bone density: Lipid panel:  No Known Allergies  Current Outpatient Prescriptions  Medication Sig Dispense Refill  . ALPRAZolam (XANAX) 1 MG tablet Take 1 tablet (1 mg total) by mouth 4 (four) times daily. 30 tablet 0  . aspirin 81 MG tablet Take 1 tablet (81 mg total) by mouth daily. 30 tablet 12  . morphine (MSIR) 30 MG tablet Take 1 pill, as needed, for pain every 4-6 hours 120 tablet 0  . oxymorphone (OPANA ER) 20 MG 12 hr tablet Take 1 tablet (20 mg total) by mouth every 12 (twelve) hours. 60 tablet 0   No current facility-administered medications for this visit.   OBJECTIVE: Filed Vitals:   06/14/14 1528  BP: 165/81  Pulse: 86  Temp: 97.7 F (36.5 C)  Resp: 18   Body mass index is 34.01 kg/(m^2). ECOG FS:1 - Symptomatic but completely ambulatory Ocular: Sclerae unicteric, pupils equal, round and reactive to light Ear-nose-throat: Oropharynx clear, dentition fair Lymphatic: No cervical or supraclavicular adenopathy Lungs no rales or rhonchi, good excursion  bilaterally Heart regular rate and rhythm, no murmur appreciated Abd soft, nontender, positive bowel sounds MSK no focal spinal tenderness, no joint edema Neuro: non-focal, well-oriented, appropriate affect  LAB RESULTS: CMP     Component Value Date/Time   NA 141 06/14/2014 1510   K 4.3 06/14/2014 1510   CL 100 06/14/2014 1510   CO2 29 06/14/2014 1510   GLUCOSE 138* 06/14/2014 1510   BUN 13 06/14/2014 1510   CREATININE 1.1 06/14/2014 1510   CALCIUM 9.0 06/14/2014 1510   PROT 7.0 06/14/2014 1510   AST 22 06/14/2014 1510   ALT 24 06/14/2014 1510   ALKPHOS 84  06/14/2014 1510   BILITOT 0.60 06/14/2014 1510   I No results found for: SPEP Lab Results  Component Value Date   WBC 8.2 06/14/2014   NEUTROABS 5.7 06/14/2014   HGB 15.7 06/14/2014   HCT 48.3 06/14/2014   MCV 87 06/14/2014   PLT 252 06/14/2014   No results found for: LABCA2 No components found for: [id number] No results for input(s): INR in the last 168 hours. Urinalysis No results found for: COLORURINE STUDIES: No results found.  ASSESSMENT/PLAN: Jacob Bradford is 54 year old gentleman with polycythemia. He is JAK2 negative. He is symptomatic today with headache and numbness and tingling in his hands.  We will go ahead and phlebotomize him today. His Hct today is 48.3. We will see what his iron studies show. We will see him back in 4 months for labs and follow-up He knows to call here with any questions or concerns and to go to the ED in the event of an emergency. We can certainly see him sooner if need be.   Eliezer Bottom, NP 06/14/2014 4:30 PM

## 2014-06-14 NOTE — Patient Instructions (Signed)

## 2014-06-15 LAB — IRON AND TIBC CHCC
%SAT: 17 % — ABNORMAL LOW (ref 20–55)
IRON: 80 ug/dL (ref 42–163)
TIBC: 469 ug/dL — AB (ref 202–409)
UIBC: 389 ug/dL — AB (ref 117–376)

## 2014-06-15 LAB — PSA: PSA: 0.73 ng/mL (ref <=4.00)

## 2014-06-15 LAB — FERRITIN CHCC: Ferritin: 18 ng/ml — ABNORMAL LOW (ref 22–316)

## 2014-06-16 ENCOUNTER — Telehealth: Payer: Self-pay | Admitting: *Deleted

## 2014-06-16 NOTE — Telephone Encounter (Signed)
Left voicemail informing pt that he needs to call our office to schedule an appt to complete his phlebotomy since we were unable to the other day. Pt informed nurse on Monday, before he left the office that he would schedule an appt to come back this week. Pt has not set this appt up yet.

## 2014-06-18 ENCOUNTER — Telehealth: Payer: Self-pay | Admitting: Hematology & Oncology

## 2014-06-18 NOTE — Telephone Encounter (Signed)
Pt left message to schedule phlebotomy either mon, tues or wens next week in the afternoon. I left him message with 1-18 appointment and to call back if not ok.

## 2014-06-21 ENCOUNTER — Ambulatory Visit (HOSPITAL_BASED_OUTPATIENT_CLINIC_OR_DEPARTMENT_OTHER): Payer: Managed Care, Other (non HMO)

## 2014-06-21 VITALS — BP 161/85 | HR 93 | Temp 98.3°F | Resp 20

## 2014-06-21 DIAGNOSIS — D45 Polycythemia vera: Secondary | ICD-10-CM | POA: Diagnosis not present

## 2014-06-21 NOTE — Patient Instructions (Signed)

## 2014-06-21 NOTE — Progress Notes (Signed)
Jacob Bradford presents today for phlebotomy per MD orders. Phlebotomy procedure started a t1510 and ended at 1520. 500 ml removed. Patient observed for 30 minutes after procedure without any incident. Patient tolerated procedure well. IV needle removed intact.

## 2014-07-15 ENCOUNTER — Emergency Department (HOSPITAL_COMMUNITY)
Admission: EM | Admit: 2014-07-15 | Discharge: 2014-07-15 | Disposition: A | Discharge status: Home / Self Care | Payer: 59 | Attending: Emergency Medicine | Admitting: Emergency Medicine

## 2014-07-15 ENCOUNTER — Encounter (HOSPITAL_COMMUNITY): Payer: Self-pay | Admitting: Emergency Medicine

## 2014-07-15 ENCOUNTER — Emergency Department (HOSPITAL_COMMUNITY): Payer: 59

## 2014-07-15 DIAGNOSIS — Z7982 Long term (current) use of aspirin: Secondary | ICD-10-CM | POA: Diagnosis not present

## 2014-07-15 DIAGNOSIS — S66514A Strain of intrinsic muscle, fascia and tendon of right ring finger at wrist and hand level, initial encounter: Secondary | ICD-10-CM | POA: Diagnosis not present

## 2014-07-15 DIAGNOSIS — I10 Essential (primary) hypertension: Secondary | ICD-10-CM | POA: Insufficient documentation

## 2014-07-15 DIAGNOSIS — Y9389 Activity, other specified: Secondary | ICD-10-CM | POA: Insufficient documentation

## 2014-07-15 DIAGNOSIS — S8991XA Unspecified injury of right lower leg, initial encounter: Secondary | ICD-10-CM | POA: Diagnosis not present

## 2014-07-15 DIAGNOSIS — Y998 Other external cause status: Secondary | ICD-10-CM | POA: Diagnosis not present

## 2014-07-15 DIAGNOSIS — Z79899 Other long term (current) drug therapy: Secondary | ICD-10-CM | POA: Diagnosis not present

## 2014-07-15 DIAGNOSIS — Y9289 Other specified places as the place of occurrence of the external cause: Secondary | ICD-10-CM | POA: Insufficient documentation

## 2014-07-15 DIAGNOSIS — Z8589 Personal history of malignant neoplasm of other organs and systems: Secondary | ICD-10-CM | POA: Diagnosis not present

## 2014-07-15 DIAGNOSIS — S66911A Strain of unspecified muscle, fascia and tendon at wrist and hand level, right hand, initial encounter: Secondary | ICD-10-CM

## 2014-07-15 DIAGNOSIS — S6991XA Unspecified injury of right wrist, hand and finger(s), initial encounter: Secondary | ICD-10-CM | POA: Diagnosis present

## 2014-07-15 DIAGNOSIS — W1839XA Other fall on same level, initial encounter: Secondary | ICD-10-CM | POA: Diagnosis not present

## 2014-07-15 DIAGNOSIS — Z720 Tobacco use: Secondary | ICD-10-CM | POA: Insufficient documentation

## 2014-07-15 DIAGNOSIS — F329 Major depressive disorder, single episode, unspecified: Secondary | ICD-10-CM | POA: Insufficient documentation

## 2014-07-15 DIAGNOSIS — IMO0001 Reserved for inherently not codable concepts without codable children: Secondary | ICD-10-CM

## 2014-07-15 DIAGNOSIS — M25461 Effusion, right knee: Secondary | ICD-10-CM

## 2014-07-15 HISTORY — DX: Essential (primary) hypertension: I10

## 2014-07-15 HISTORY — DX: Depression, unspecified: F32.A

## 2014-07-15 HISTORY — DX: Polycythemia vera: D45

## 2014-07-15 HISTORY — DX: Major depressive disorder, single episode, unspecified: F32.9

## 2014-07-15 NOTE — ED Provider Notes (Signed)
CSN: [id number]     Arrival date & time 07/15/14  1453 History   First MD Initiated Contact with Patient 07/15/14 1511     Chief Complaint  Patient presents with  . Knee Pain  . Wrist Pain     (Consider location/radiation/quality/duration/timing/severity/associated sxs/prior Treatment) HPI Comments: Pt c/o right hand and knee pain. States that he has fallen twice and has hurt the areas with 2 separate falls. Denies previous injury to either also. No numbness or weakness. States that his knee has been swollen as well as the area at the base of his thumb  The history is provided by the patient. No language interpreter was used.    Past Medical History  Diagnosis Date  . Polycythemia vera   . Hypertension   . Depression    Past Surgical History  Procedure Laterality Date  . Tonsillectomy     No family history on file. History  Substance Use Topics  . Smoking status: Current Every Day Smoker -- 1.00 packs/day for 32 years    Types: Cigarettes    Start date: 07/14/1979  . Smokeless tobacco: Never Used     Comment: 06-2014  still using vapor cigarettes  . Alcohol Use: No    Review of Systems  All other systems reviewed and are negative.     Allergies  Review of patient's allergies indicates no known allergies.  Home Medications   Prior to Admission medications   Medication Sig Start Date End Date Taking? Authorizing Provider  ALPRAZolam Duanne Moron) 1 MG tablet Take 1 tablet (1 mg total) by mouth 4 (four) times daily. 09/28/13   Volanda Napoleon, MD  aspirin 81 MG tablet Take 1 tablet (81 mg total) by mouth daily. 09/25/13   Volanda Napoleon, MD  morphine (MSIR) 30 MG tablet Take 1 pill, as needed, for pain every 4-6 hours 06/14/14   Eliezer Bottom, NP  oxymorphone (OPANA ER) 20 MG 12 hr tablet Take 1 tablet (20 mg total) by mouth every 12 (twelve) hours. 06/14/14   Eliezer Bottom, NP   BP 157/99 mmHg  Pulse 76  Temp(Src) 97.8 F (36.6 C) (Oral)  Resp 16  Ht 6'  (1.829 m)  Wt 234 lb (106.142 kg)  BMI 31.73 kg/m2  SpO2 100% Physical Exam  Constitutional: He is oriented to person, place, and time. He appears well-developed and well-nourished.  Cardiovascular: Normal rate and regular rhythm.   Musculoskeletal:  Mild swelling noted at the base of the right thumb. Full rom, no redness or warmth noted. Full rom or right knee, mild swelling  Neurological: He is oriented to person, place, and time. He exhibits normal muscle tone. Coordination normal.  Skin: Skin is warm and dry.  Nursing note and vitals reviewed.   ED Course  Procedures (including critical care time) Labs Review Labs Reviewed - No data to display  Imaging Review Dg Knee Complete 4 Views Right  07/15/2014   CLINICAL DATA:  Patient fell on the ice 1 week prior. Persistent medial pain and swelling  EXAM: RIGHT KNEE - COMPLETE 4+ VIEW  COMPARISON:  None.  FINDINGS: Frontal, lateral, and bilateral oblique views were obtained. There is a moderate joint effusion. No fracture or dislocation. Joint spaces appear intact. No erosive change.  IMPRESSION: Moderate joint effusion.  No fracture or dislocation.   Electronically Signed   By: Lowella Grip III M.D.   On: 07/15/2014 15:54   Dg Hand Complete Right  07/15/2014   CLINICAL DATA:  Slipped and fell on some ice at his house about 2 weeks ago. He tried to catch himself. Pain and swelling at base of thumb into hand.  EXAM: RIGHT HAND - COMPLETE 3+ VIEW  COMPARISON:  04/13/2014  FINDINGS: No acute fracture. Joints are normally space and aligned. There is an old, healed fracture of the base of the fifth metacarpal. Mild spurring is noted at the second and third metacarpophalangeal joints. Soft tissues are unremarkable.  IMPRESSION: No fracture or acute finding.   Electronically Signed   By: Lajean Manes M.D.   On: 07/15/2014 15:55     EKG Interpretation None      MDM   Final diagnoses:  Knee effusion, right  Finger strain, right, initial  encounter    No bony abnormality noted. Pt placed in immobilizer and crutches and given follow up with DR Aline Brochure. Pt is okay to follow up. Pt has chronic pain medications   Glendell Docker, NP 07/15/14 [place], MD 07/15/14 [phone]

## 2014-07-15 NOTE — Discharge Instructions (Signed)
Finger Sprain A finger sprain is a tear in one of the strong, fibrous tissues that connect the bones (ligaments) in your finger. The severity of the sprain depends on how much of the ligament is torn. The tear can be either partial or complete. CAUSES  Often, sprains are a result of a fall or accident. If you extend your hands to catch an object or to protect yourself, the force of the impact causes the fibers of your ligament to stretch too much. This excess tension causes the fibers of your ligament to tear. SYMPTOMS  You may have some loss of motion in your finger. Other symptoms include:  Bruising.  Tenderness.  Swelling. DIAGNOSIS  In order to diagnose finger sprain, your caregiver will physically examine your finger or thumb to determine how torn the ligament is. Your caregiver may also suggest an X-ray exam of your finger to make sure no bones are broken. TREATMENT  If your ligament is only partially torn, treatment usually involves keeping the finger in a fixed position (immobilization) for a short period. To do this, your caregiver will apply a bandage, cast, or splint to keep your finger from moving until it heals. For a partially torn ligament, the healing process usually takes 2 to 3 weeks. If your ligament is completely torn, you may need surgery to reconnect the ligament to the bone. After surgery a cast or splint will be applied and will need to stay on your finger or thumb for 4 to 6 weeks while your ligament heals. HOME CARE INSTRUCTIONS  Keep your injured finger elevated, when possible, to decrease swelling.  To ease pain and swelling, apply ice to your joint twice a day, for 2 to 3 days:  Put ice in a plastic bag.  Place a towel between your skin and the bag.  Leave the ice on for 15 minutes.  Only take over-the-counter or prescription medicine for pain as directed by your caregiver.  Do not wear rings on your injured finger.  Do not leave your finger unprotected  until pain and stiffness go away (usually 3 to 4 weeks).  Do not allow your cast or splint to get wet. Cover your cast or splint with a plastic bag when you shower or bathe. Do not swim.  Your caregiver may suggest special exercises for you to do during your recovery to prevent or limit permanent stiffness. SEEK IMMEDIATE MEDICAL CARE IF:  Your cast or splint becomes damaged.  Your pain becomes worse rather than better. MAKE SURE YOU:  Understand these instructions.  Will watch your condition.  Will get help right away if you are not doing well or get worse. Document Released: 06/28/2004 Document Revised: 08/13/2011 Document Reviewed: 01/22/2011 [facility] Patient Information 2015 [place], [place]. This information is not intended to replace advice given to you by your health care provider. Make sure you discuss any questions you have with your health care provider.  Knee Effusion  Knee effusion means you have fluid in your knee. The knee may be more difficult to bend and move. HOME CARE  Use crutches or a brace as told by your doctor.  Put ice on the injured area.  Put ice in a plastic bag.  Place a towel between your skin and the bag.  Leave the ice on for 15-20 minutes, 03-04 times a day.  Raise (elevate) your knee as much as possible.  Only take medicine as told by your doctor.  You may need to do strengthening exercises.  Ask your doctor.  Continue with your normal diet and activities as told by your doctor. GET HELP RIGHT AWAY IF:  You have more puffiness (swelling) in your knee.  You see redness, puffiness, or have more pain in your knee.  You have a temperature by mouth above 102 F (38.9 C).  You get a rash.  You have trouble breathing.  You have a reaction to any medicine you are taking.  You have a lot of pain when you move your knee. MAKE SURE YOU:  Understand these instructions.  Will watch your condition.  Will get help right away if you are  not doing well or get worse. Document Released: 06/23/2010 Document Revised: 08/13/2011 Document Reviewed: 06/23/2010 [facility] Patient Information 2015 [place], [place]. This information is not intended to replace advice given to you by your health care provider. Make sure you discuss any questions you have with your health care provider.

## 2014-07-15 NOTE — ED Notes (Signed)
PT stated he fell onto his right knee this week and c/o painful ROM. PT also states had a fall with right wrist injury x3 weeks ago and negative wrist xrays from urgent care at that time but continues with swelling and discomfort. PT ambulatory in triage.

## 2014-07-19 ENCOUNTER — Other Ambulatory Visit: Payer: Self-pay | Admitting: *Deleted

## 2014-07-19 DIAGNOSIS — M47817 Spondylosis without myelopathy or radiculopathy, lumbosacral region: Secondary | ICD-10-CM

## 2014-07-19 MED ORDER — MORPHINE SULFATE 30 MG PO TABS: ORAL_TABLET | ORAL | Status: DC

## 2014-07-19 MED ORDER — OXYMORPHONE HCL ER 20 MG PO TB12: 20.0000 mg | ORAL_TABLET | Freq: Two times a day (BID) | ORAL | Status: DC

## 2014-07-29 ENCOUNTER — Encounter: Payer: Self-pay | Admitting: Nurse Practitioner

## 2014-07-29 NOTE — Progress Notes (Signed)
Pt called and stated he has a knee injury and the physician was inquiring about ordering him Perocet for the pain. Per Dr. Marin Olp he is already on MSIR and OPana. He does not need to be on Percocet as well. Pt is aware that he should not be taking any additional pain medications and has verbalized understanding.

## 2014-08-12 ENCOUNTER — Other Ambulatory Visit: Payer: Self-pay | Admitting: *Deleted

## 2014-08-12 ENCOUNTER — Telehealth: Payer: Self-pay | Admitting: *Deleted

## 2014-08-12 DIAGNOSIS — M47817 Spondylosis without myelopathy or radiculopathy, lumbosacral region: Secondary | ICD-10-CM

## 2014-08-12 MED ORDER — MORPHINE SULFATE 30 MG PO TABS: ORAL_TABLET | ORAL | Status: DC

## 2014-08-12 MED ORDER — OXYMORPHONE HCL ER 20 MG PO TB12: 20.0000 mg | ORAL_TABLET | Freq: Two times a day (BID) | ORAL | Status: DC

## 2014-08-12 NOTE — Telephone Encounter (Signed)
Patient called stating that he was having severe thumb pain after an injury where he fell on ice and tore several ligaments. He is being treated by Dr Tonita Cong at [facility]. Patient stated that Dr Tonita Cong told him to increase his frequency of his MSIR, but he refused to write any additional prescriptions since Dr Marin Olp was already managing his pain. Patient needs a different prescription for his MSIR because he is using his supply faster than we prescribed. Spoke to Dr Marin Olp. He is okay with giving the patient a higher prescription until the patient can have the surgery needed to fix his thumb. New prescription dispensed with a higher dose and quantity dispensed.

## 2014-09-08 ENCOUNTER — Other Ambulatory Visit: Payer: Self-pay | Admitting: Nurse Practitioner

## 2014-09-08 DIAGNOSIS — M47817 Spondylosis without myelopathy or radiculopathy, lumbosacral region: Secondary | ICD-10-CM

## 2014-09-08 MED ORDER — MORPHINE SULFATE 30 MG PO TABS: ORAL_TABLET | ORAL | Status: DC

## 2014-09-08 MED ORDER — OXYMORPHONE HCL ER 20 MG PO TB12: 20.0000 mg | ORAL_TABLET | Freq: Two times a day (BID) | ORAL | Status: DC

## 2014-10-11 ENCOUNTER — Encounter: Payer: Self-pay | Admitting: Hematology & Oncology

## 2014-10-11 ENCOUNTER — Ambulatory Visit: Payer: Managed Care, Other (non HMO)

## 2014-10-11 ENCOUNTER — Other Ambulatory Visit (HOSPITAL_BASED_OUTPATIENT_CLINIC_OR_DEPARTMENT_OTHER): Payer: Managed Care, Other (non HMO)

## 2014-10-11 ENCOUNTER — Ambulatory Visit (HOSPITAL_BASED_OUTPATIENT_CLINIC_OR_DEPARTMENT_OTHER): Payer: Managed Care, Other (non HMO) | Admitting: Hematology & Oncology

## 2014-10-11 VITALS — BP 141/89 | HR 90 | Temp 97.8°F | Resp 20 | Ht 72.0 in | Wt 244.0 lb

## 2014-10-11 DIAGNOSIS — Z720 Tobacco use: Secondary | ICD-10-CM

## 2014-10-11 DIAGNOSIS — M47817 Spondylosis without myelopathy or radiculopathy, lumbosacral region: Secondary | ICD-10-CM

## 2014-10-11 DIAGNOSIS — D45 Polycythemia vera: Secondary | ICD-10-CM | POA: Diagnosis not present

## 2014-10-11 DIAGNOSIS — S83519A Sprain of anterior cruciate ligament of unspecified knee, initial encounter: Secondary | ICD-10-CM

## 2014-10-11 DIAGNOSIS — S83511S Sprain of anterior cruciate ligament of right knee, sequela: Secondary | ICD-10-CM

## 2014-10-11 DIAGNOSIS — S63591S Other specified sprain of right wrist, sequela: Secondary | ICD-10-CM | POA: Diagnosis not present

## 2014-10-11 DIAGNOSIS — S63599A Other specified sprain of unspecified wrist, initial encounter: Secondary | ICD-10-CM

## 2014-10-11 HISTORY — DX: Sprain of anterior cruciate ligament of unspecified knee, initial encounter: S83.519A

## 2014-10-11 HISTORY — DX: Other specified sprain of unspecified wrist, initial encounter: S63.599A

## 2014-10-11 LAB — COMPREHENSIVE METABOLIC PANEL
ALBUMIN: 3.9 g/dL (ref 3.5–5.2)
ALT: 22 U/L (ref 0–53)
AST: 18 U/L (ref 0–37)
Alkaline Phosphatase: 87 U/L (ref 39–117)
BUN: 12 mg/dL (ref 6–23)
CHLORIDE: 100 meq/L (ref 96–112)
CO2: 28 meq/L (ref 19–32)
Calcium: 8.9 mg/dL (ref 8.4–10.5)
Creatinine, Ser: 1.01 mg/dL (ref 0.50–1.35)
Glucose, Bld: 87 mg/dL (ref 70–99)
POTASSIUM: 3.7 meq/L (ref 3.5–5.3)
Sodium: 136 mEq/L (ref 135–145)
Total Bilirubin: 0.4 mg/dL (ref 0.2–1.2)
Total Protein: 6.9 g/dL (ref 6.0–8.3)

## 2014-10-11 LAB — CBC WITH DIFFERENTIAL (CANCER CENTER ONLY)
BASO#: 0.1 10*3/uL (ref 0.0–0.2)
BASO%: 0.8 % (ref 0.0–2.0)
EOS ABS: 0.3 10*3/uL (ref 0.0–0.5)
EOS%: 3.5 % (ref 0.0–7.0)
HCT: 42.8 % (ref 38.7–49.9)
HEMOGLOBIN: 14.4 g/dL (ref 13.0–17.1)
LYMPH#: 1.6 10*3/uL (ref 0.9–3.3)
LYMPH%: 19.5 % (ref 14.0–48.0)
MCH: 28.9 pg (ref 28.0–33.4)
MCHC: 33.6 g/dL (ref 32.0–35.9)
MCV: 86 fL (ref 82–98)
MONO#: 0.8 10*3/uL (ref 0.1–0.9)
MONO%: 9.9 % (ref 0.0–13.0)
NEUT#: 5.5 10*3/uL (ref 1.5–6.5)
NEUT%: 66.3 % (ref 40.0–80.0)
Platelets: 213 10*3/uL (ref 145–400)
RBC: 4.99 10*6/uL (ref 4.20–5.70)
RDW: 14.7 % (ref 11.1–15.7)
WBC: 8.3 10*3/uL (ref 4.0–10.0)

## 2014-10-11 MED ORDER — OXYMORPHONE HCL ER 20 MG PO TB12: 20.0000 mg | ORAL_TABLET | Freq: Three times a day (TID) | ORAL | Status: DC

## 2014-10-11 MED ORDER — MORPHINE SULFATE 30 MG PO TABS: ORAL_TABLET | ORAL | Status: DC

## 2014-10-11 NOTE — Progress Notes (Signed)
Hematology and Oncology Follow Up Visit  Jacob Bradford [id number] 08/11/1960 54 y.o. 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  Bradford is back for followup. He is having a difficult time right now. Apparently, over the winter, he slipped a couple times and fell. He or his right thumb ligament. He also tore some ligaments in his right knee. He has a brace on his right knee.  He needs surgery but he cannot stay out of work for 3 months or so.  He is having quite a bit of discomfort. He is on long-acting oxymorphone. We will have to increase it up to 3 times a day dosing.  He is still smoking. He's trying to cut back. I talked to him about this..  He has done well with the polycythemia.  He's had no nausea vomiting. There's been no change in bowel or bladder habits. He's had some slight leg swelling, mostly of the right leg area and  He's had no fever. He's had no cough.   Medications:  Current outpatient prescriptions:  .  ALPRAZolam (XANAX) 1 MG tablet, Take 1 tablet (1 mg total) by mouth 4 (four) times daily. (Patient taking differently: Take 0.5-1 mg by mouth 4 (four) times daily as needed for anxiety. ), Disp: 30 tablet, Rfl: 0 .  amLODipine (NORVASC) 5 MG tablet, Take 5 mg by mouth daily., Disp: , Rfl:  .  aspirin 81 MG tablet, Take 1 tablet (81 mg total) by mouth daily., Disp: 30 tablet, Rfl: 12 .  Calcium Carbonate-Vitamin D (CALCIUM-VITAMIN D) 500-200 MG-UNIT per tablet, Take 1 tablet by mouth daily., Disp: , Rfl:  .  morphine (MSIR) 30 MG tablet, Take 1-2 pills, as needed, for pain every 4-6 hours, Disp: 180 tablet, Rfl: 0 .  multivitamin-lutein (OCUVITE-LUTEIN) CAPS capsule, Take 1 capsule by mouth daily.,  Disp: , Rfl:  .  Omega-3 Fatty Acids (FISH OIL) 1000 MG CAPS, Take by mouth every morning., Disp: , Rfl:  .  oxymorphone (OPANA ER) 20 MG 12 hr tablet, Take 1 tablet (20 mg total) by mouth every 8 (eight) hours., Disp: 90 tablet, Rfl: 0 .  polyethylene glycol (MIRALAX / GLYCOLAX) packet, Take 17 g by mouth daily., Disp: , Rfl:  .  pyridOXINE (VITAMIN B-6) 100 MG tablet, Take 100 mg by mouth daily., Disp: , Rfl:  .  vitamin C (ASCORBIC ACID) 500 MG tablet, Take 500 mg by mouth daily., Disp: , Rfl:   Allergies: No Known Allergies  Past Medical History, Surgical history, Social history, and Family History were reviewed and updated.  Review of Systems: As above  Physical Exam:  height is 6' (1.829 m) and weight is 244 lb (110.678 kg). His oral temperature is 97.8 F (36.6 C). His blood pressure is 141/89 and his pulse is 90. His respiration is 20.   Well-developed well-nourished white gentleman. There are no ocular or oral lesions. He has no palpable cervical or supraclavicular lymph nodes. Lungs are clear. Cardiac exam regular rate and rhythm with no murmurs rubs or bruits. Abdomen is soft. he has good bowel sounds. There is no palpable liver or spleen tip. Back exam no tenderness over the spine ribs or hips. Extremities shows a brace on  his right knee. There is slight swelling in the right knee. He has some swelling of the right thumb.  Lab Results  Component Value Date   WBC 8.3 10/11/2014   HGB 14.4 10/11/2014   HCT 42.8 10/11/2014   MCV 86 10/11/2014   PLT 213 10/11/2014     Chemistry      Component Value Date/Time   NA 141 06/14/2014 1510   K 4.3 06/14/2014 1510   CL 100 06/14/2014 1510   CO2 29 06/14/2014 1510   BUN 13 06/14/2014 1510   CREATININE 1.1 06/14/2014 1510      Component Value Date/Time   CALCIUM 9.0 06/14/2014 1510   ALKPHOS 84 06/14/2014 1510   AST 22 06/14/2014 1510   ALT 24 06/14/2014 1510   BILITOT 0.60 06/14/2014 1510         Impression and  Plan: Jacob Bradford is 54 year old gentleman with polycythemia. He is  JAK2 negative. He has had no problems with this.he does not need to be phlebotomized for this right now.  His big problem is the ligamentous damage that occurred with this falling. Again it sounds like he needs surgery. Hopefully, he will be able to have surgery so that he can get back to a decent quality of life.  I realize the difficulty that he has with respect to work and not be having to miss that much work.  I do not need to phlebotomized him as I stated previously.  I'll want to see him back in a couple months.  We will go ahead and phlebotomize him now.  We will now give him back in another 2 or 3 months.    Volanda Napoleon, MD 5/9/20166:12 PM

## 2014-10-12 LAB — IRON AND TIBC CHCC
%SAT: 14 % — ABNORMAL LOW (ref 20–55)
IRON: 65 ug/dL (ref 42–163)
TIBC: 465 ug/dL — ABNORMAL HIGH (ref 202–409)
UIBC: 400 ug/dL — ABNORMAL HIGH (ref 117–376)

## 2014-10-12 LAB — FERRITIN CHCC: Ferritin: 15 ng/ml — ABNORMAL LOW (ref 22–316)

## 2014-11-08 ENCOUNTER — Other Ambulatory Visit: Payer: Self-pay | Admitting: *Deleted

## 2014-11-08 DIAGNOSIS — M47817 Spondylosis without myelopathy or radiculopathy, lumbosacral region: Secondary | ICD-10-CM

## 2014-11-08 DIAGNOSIS — S63591S Other specified sprain of right wrist, sequela: Secondary | ICD-10-CM

## 2014-11-08 MED ORDER — MORPHINE SULFATE 30 MG PO TABS: ORAL_TABLET | ORAL | Status: DC

## 2014-11-08 MED ORDER — OXYMORPHONE HCL ER 20 MG PO TB12: 20.0000 mg | ORAL_TABLET | Freq: Three times a day (TID) | ORAL | Status: DC

## 2014-11-29 ENCOUNTER — Encounter: Payer: Self-pay | Admitting: Family

## 2014-11-29 ENCOUNTER — Other Ambulatory Visit: Payer: Self-pay | Admitting: *Deleted

## 2014-11-29 ENCOUNTER — Other Ambulatory Visit (HOSPITAL_BASED_OUTPATIENT_CLINIC_OR_DEPARTMENT_OTHER): Payer: Managed Care, Other (non HMO)

## 2014-11-29 ENCOUNTER — Ambulatory Visit (HOSPITAL_BASED_OUTPATIENT_CLINIC_OR_DEPARTMENT_OTHER): Payer: Managed Care, Other (non HMO)

## 2014-11-29 ENCOUNTER — Ambulatory Visit (HOSPITAL_BASED_OUTPATIENT_CLINIC_OR_DEPARTMENT_OTHER): Payer: Managed Care, Other (non HMO) | Admitting: Family

## 2014-11-29 VITALS — BP 162/96 | HR 97 | Temp 97.9°F | Resp 16 | Ht 72.0 in | Wt 238.0 lb

## 2014-11-29 VITALS — BP 146/81 | HR 81

## 2014-11-29 DIAGNOSIS — M47817 Spondylosis without myelopathy or radiculopathy, lumbosacral region: Secondary | ICD-10-CM

## 2014-11-29 DIAGNOSIS — D45 Polycythemia vera: Secondary | ICD-10-CM | POA: Diagnosis not present

## 2014-11-29 DIAGNOSIS — S63591S Other specified sprain of right wrist, sequela: Secondary | ICD-10-CM

## 2014-11-29 LAB — COMPREHENSIVE METABOLIC PANEL
ALT: 21 U/L (ref 0–53)
AST: 19 U/L (ref 0–37)
Albumin: 4 g/dL (ref 3.5–5.2)
Alkaline Phosphatase: 98 U/L (ref 39–117)
BUN: 14 mg/dL (ref 6–23)
CO2: 24 mEq/L (ref 19–32)
Calcium: 9.3 mg/dL (ref 8.4–10.5)
Chloride: 100 mEq/L (ref 96–112)
Creatinine, Ser: 1.1 mg/dL (ref 0.50–1.35)
GLUCOSE: 116 mg/dL — AB (ref 70–99)
POTASSIUM: 4 meq/L (ref 3.5–5.3)
SODIUM: 137 meq/L (ref 135–145)
Total Bilirubin: 0.3 mg/dL (ref 0.2–1.2)
Total Protein: 6.7 g/dL (ref 6.0–8.3)

## 2014-11-29 LAB — CBC WITH DIFFERENTIAL (CANCER CENTER ONLY)
BASO#: 0.1 10*3/uL (ref 0.0–0.2)
BASO%: 0.7 % (ref 0.0–2.0)
EOS ABS: 0.3 10*3/uL (ref 0.0–0.5)
EOS%: 4.3 % (ref 0.0–7.0)
HCT: 43.9 % (ref 38.7–49.9)
HGB: 14.7 g/dL (ref 13.0–17.1)
LYMPH#: 1.6 10*3/uL (ref 0.9–3.3)
LYMPH%: 22.3 % (ref 14.0–48.0)
MCH: 29 pg (ref 28.0–33.4)
MCHC: 33.5 g/dL (ref 32.0–35.9)
MCV: 87 fL (ref 82–98)
MONO#: 0.7 10*3/uL (ref 0.1–0.9)
MONO%: 9.3 % (ref 0.0–13.0)
NEUT%: 63.4 % (ref 40.0–80.0)
NEUTROS ABS: 4.4 10*3/uL (ref 1.5–6.5)
Platelets: 259 10*3/uL (ref 145–400)
RBC: 5.07 10*6/uL (ref 4.20–5.70)
RDW: 14 % (ref 11.1–15.7)
WBC: 7 10*3/uL (ref 4.0–10.0)

## 2014-11-29 LAB — TESTOSTERONE: Testosterone: 217 ng/dL — ABNORMAL LOW (ref 300–890)

## 2014-11-29 MED ORDER — MORPHINE SULFATE 30 MG PO TABS: ORAL_TABLET | ORAL | Status: DC

## 2014-11-29 MED ORDER — OXYMORPHONE HCL ER 20 MG PO TB12: 20.0000 mg | ORAL_TABLET | Freq: Three times a day (TID) | ORAL | Status: DC

## 2014-11-29 NOTE — Patient Instructions (Signed)

## 2014-11-29 NOTE — Progress Notes (Signed)
Jacob Bradford presents today for phlebotomy per MD orders. Phlebotomy procedure started at 1327 and ended at 1335. 562ml removed. Patient observed for 30 minutes after procedure without any incident. Patient tolerated procedure well. IV needle removed intact.

## 2014-11-29 NOTE — Progress Notes (Signed)
Hematology and Oncology Follow Up Visit  KELLAN RAFFIELD [id number] 06-Sep-1960 54 y.o. 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 Keating is here today for a follow-up. He is feeling fatigued and sleeping more than usual.  His Hct today is 43.9.  He denies fever, chills, night sweats, n/v, cough, rash, dizziness, blurred vision, SOB, chest pain, palpitations, abdominal pain, constipation, diarrhea, blood in urine or stool. He has had a headache off and on recently.  He is still having swelling and tenderness in the right knee and also the right thumb. He does not want to have surgery on his knee because he would be out of work too long and may lose his job. He wears a knee brace every day. No new aches or pains.  He has cut back on his smoking.  He has a good appetite and is staying well hydrated. His weight is stable.   Medications:    Medication List       This list is accurate as of: 11/29/14  3:10 PM.  Always use your most recent med list.               ALPRAZolam 1 MG tablet  Commonly known as:  XANAX  Take 1 tablet (1 mg total) by mouth 4 (four) times daily.     amLODipine 5 MG tablet  Commonly known as:  NORVASC  Take 5 mg by mouth daily.     aspirin 81 MG tablet  Take 1 tablet (81 mg total) by mouth daily.     calcium-vitamin D 500-200 MG-UNIT per tablet  Take 1 tablet by mouth daily.     esomeprazole 40 MG capsule  Commonly known as:  NEXIUM     Fish Oil 1000 MG Caps  Take by mouth every morning.     hydrochlorothiazide 25 MG tablet  Commonly known as:  HYDRODIURIL     morphine 30 MG tablet  Commonly known as:  MSIR  Take 1-2 pills, as needed, for pain every 4-6 hours     multivitamin-lutein Caps capsule  Take 1 capsule by mouth daily.     oxymorphone 20 MG 12 hr tablet  Commonly known as:  OPANA ER  Take  1 tablet (20 mg total) by mouth every 8 (eight) hours. DO NOT FILL UNTIL 12/05/2014     polyethylene glycol packet  Commonly known as:  MIRALAX / GLYCOLAX  Take 17 g by mouth daily.     pyridOXINE 100 MG tablet  Commonly known as:  VITAMIN B-6  Take 100 mg by mouth daily.     VIAGRA 50 MG tablet  Generic drug:  sildenafil     vitamin C 500 MG tablet  Commonly known as:  ASCORBIC ACID  Take 500 mg by mouth daily.        Allergies: No Known Allergies  Past Medical History, Surgical history, Social history, and Family History were reviewed and updated.  Review of Systems: All other 10 point review of systems is negative.   Physical Exam:  height is 6' (1.829 m) and weight is 238 lb (107.956 kg). His oral temperature is 97.9 F (36.6 C). His blood pressure is 162/96 and his pulse is 97. His respiration is 16.   Wt Readings from Last 3 Encounters:  11/29/14 238 lb (107.956 kg)  10/11/14 244 lb (110.678  kg)  07/15/14 234 lb (106.142 kg)    Ocular: Sclerae unicteric, pupils equal, round and reactive to light Ear-nose-throat: Oropharynx clear, dentition fair Lymphatic: No cervical or supraclavicular adenopathy Lungs no rales or rhonchi, good excursion bilaterally Heart regular rate and rhythm, no murmur appreciated Abd soft, nontender, positive bowel sounds MSK no focal spinal tenderness, no joint edema Neuro: non-focal, well-oriented, appropriate affect Breasts: Deferred  Lab Results  Component Value Date   WBC 7.0 11/29/2014   HGB 14.7 11/29/2014   HCT 43.9 11/29/2014   MCV 87 11/29/2014   PLT 259 11/29/2014   Lab Results  Component Value Date   FERRITIN 15* 10/11/2014   IRON 65 10/11/2014   TIBC 465* 10/11/2014   UIBC 400* 10/11/2014   IRONPCTSAT 14* 10/11/2014   Lab Results  Component Value Date   RETICCTPCT 1.4 02/22/2011   RBC 5.07 11/29/2014   RETICCTABS 77.0 02/22/2011   No results found for: KPAFRELGTCHN, LAMBDASER, KAPLAMBRATIO No results found  for: IGGSERUM, IGA, IGMSERUM No results found for: Odetta Pink, SPEI   Chemistry      Component Value Date/Time   NA 136 10/11/2014 1438   NA 141 06/14/2014 1510   K 3.7 10/11/2014 1438   K 4.3 06/14/2014 1510   CL 100 10/11/2014 1438   CL 100 06/14/2014 1510   CO2 28 10/11/2014 1438   CO2 29 06/14/2014 1510   BUN 12 10/11/2014 1438   BUN 13 06/14/2014 1510   CREATININE 1.01 10/11/2014 1438   CREATININE 1.1 06/14/2014 1510      Component Value Date/Time   CALCIUM 8.9 10/11/2014 1438   CALCIUM 9.0 06/14/2014 1510   ALKPHOS 87 10/11/2014 1438   ALKPHOS 84 06/14/2014 1510   AST 18 10/11/2014 1438   AST 22 06/14/2014 1510   ALT 22 10/11/2014 1438   ALT 24 06/14/2014 1510   BILITOT 0.4 10/11/2014 1438   BILITOT 0.60 06/14/2014 1510     Impression and Plan: Mr. Achille is 54 year old gentleman with polycythemia - JAK2 negative. So far, this has not been a problem for him. He is feeling fatigued and has had a headache and states he thinks he needs a phlebotomy today. His complexion is quite [place]. His Hct is 43.9. We will go ahead and do a phlebotomy on him today.   I will also add a Testosterone and TSH to his labs.  We will plan to see him back in 6 weeks for labs and follow-up. He knows to call with any questions or concerns. We can certainly see him sooner if need be.   Eliezer Bottom, NP 6/27/20163:10 PM

## 2014-11-30 LAB — IRON AND TIBC CHCC
%SAT: 6 % — ABNORMAL LOW (ref 20–55)
Iron: 27 ug/dL — ABNORMAL LOW (ref 42–163)
TIBC: 466 ug/dL — AB (ref 202–409)
UIBC: 440 ug/dL — ABNORMAL HIGH (ref 117–376)

## 2014-11-30 LAB — FERRITIN CHCC: Ferritin: 15 ng/ml — ABNORMAL LOW (ref 22–316)

## 2014-11-30 LAB — TSH CHCC: TSH: 1.638 m[IU]/L (ref 0.320–4.118)

## 2014-12-03 ENCOUNTER — Telehealth: Payer: Self-pay | Admitting: *Deleted

## 2014-12-03 NOTE — Telephone Encounter (Signed)
Patient wants to know the results of labs drawn during his appointment this week. Reviewed labs with Dr Marin Olp and then with patient. He is appreciative to get results.

## 2015-01-03 ENCOUNTER — Other Ambulatory Visit: Payer: Self-pay | Admitting: *Deleted

## 2015-01-03 DIAGNOSIS — M47817 Spondylosis without myelopathy or radiculopathy, lumbosacral region: Secondary | ICD-10-CM

## 2015-01-03 DIAGNOSIS — S63591S Other specified sprain of right wrist, sequela: Secondary | ICD-10-CM

## 2015-01-03 MED ORDER — OXYMORPHONE HCL ER 20 MG PO TB12: 20.0000 mg | ORAL_TABLET | Freq: Three times a day (TID) | ORAL | Status: DC

## 2015-01-03 MED ORDER — MORPHINE SULFATE 30 MG PO TABS: ORAL_TABLET | ORAL | Status: DC

## 2015-01-31 ENCOUNTER — Other Ambulatory Visit: Payer: Self-pay | Admitting: *Deleted

## 2015-01-31 DIAGNOSIS — S63591S Other specified sprain of right wrist, sequela: Secondary | ICD-10-CM

## 2015-01-31 DIAGNOSIS — M47817 Spondylosis without myelopathy or radiculopathy, lumbosacral region: Secondary | ICD-10-CM

## 2015-01-31 MED ORDER — MORPHINE SULFATE 30 MG PO TABS: ORAL_TABLET | ORAL | Status: DC

## 2015-01-31 MED ORDER — OXYMORPHONE HCL ER 20 MG PO TB12: 20.0000 mg | ORAL_TABLET | Freq: Three times a day (TID) | ORAL | Status: DC

## 2015-02-28 ENCOUNTER — Other Ambulatory Visit: Payer: Self-pay | Admitting: Nurse Practitioner

## 2015-02-28 DIAGNOSIS — M47817 Spondylosis without myelopathy or radiculopathy, lumbosacral region: Secondary | ICD-10-CM

## 2015-02-28 DIAGNOSIS — S63591S Other specified sprain of right wrist, sequela: Secondary | ICD-10-CM

## 2015-02-28 MED ORDER — OXYMORPHONE HCL ER 20 MG PO TB12: 20.0000 mg | ORAL_TABLET | Freq: Three times a day (TID) | ORAL | Status: DC

## 2015-02-28 MED ORDER — MORPHINE SULFATE 30 MG PO TABS: ORAL_TABLET | ORAL | Status: DC

## 2015-03-29 ENCOUNTER — Other Ambulatory Visit: Payer: Self-pay | Admitting: Family

## 2015-03-29 DIAGNOSIS — M47817 Spondylosis without myelopathy or radiculopathy, lumbosacral region: Secondary | ICD-10-CM

## 2015-03-29 DIAGNOSIS — S63591S Other specified sprain of right wrist, sequela: Secondary | ICD-10-CM

## 2015-03-29 MED ORDER — OXYMORPHONE HCL ER 20 MG PO TB12: 20.0000 mg | ORAL_TABLET | Freq: Three times a day (TID) | ORAL | Status: DC

## 2015-03-29 MED ORDER — MORPHINE SULFATE 30 MG PO TABS: ORAL_TABLET | ORAL | Status: DC

## 2015-04-27 ENCOUNTER — Other Ambulatory Visit: Payer: Self-pay | Admitting: *Deleted

## 2015-04-27 DIAGNOSIS — M47817 Spondylosis without myelopathy or radiculopathy, lumbosacral region: Secondary | ICD-10-CM

## 2015-04-27 DIAGNOSIS — S63591S Other specified sprain of right wrist, sequela: Secondary | ICD-10-CM

## 2015-04-27 MED ORDER — OXYMORPHONE HCL ER 20 MG PO TB12: 20.0000 mg | ORAL_TABLET | Freq: Three times a day (TID) | ORAL | Status: DC

## 2015-04-27 MED ORDER — MORPHINE SULFATE 30 MG PO TABS: ORAL_TABLET | ORAL | Status: DC

## 2015-05-04 ENCOUNTER — Encounter: Payer: Self-pay | Admitting: Family

## 2015-05-04 ENCOUNTER — Other Ambulatory Visit (HOSPITAL_BASED_OUTPATIENT_CLINIC_OR_DEPARTMENT_OTHER): Payer: Managed Care, Other (non HMO)

## 2015-05-04 ENCOUNTER — Ambulatory Visit (HOSPITAL_BASED_OUTPATIENT_CLINIC_OR_DEPARTMENT_OTHER): Payer: Managed Care, Other (non HMO)

## 2015-05-04 ENCOUNTER — Ambulatory Visit (HOSPITAL_BASED_OUTPATIENT_CLINIC_OR_DEPARTMENT_OTHER): Payer: Managed Care, Other (non HMO) | Admitting: Family

## 2015-05-04 VITALS — BP 151/91 | HR 92 | Temp 97.9°F | Resp 16 | Ht 72.0 in | Wt 240.0 lb

## 2015-05-04 DIAGNOSIS — D45 Polycythemia vera: Secondary | ICD-10-CM

## 2015-05-04 DIAGNOSIS — Z720 Tobacco use: Secondary | ICD-10-CM | POA: Diagnosis not present

## 2015-05-04 LAB — COMPREHENSIVE METABOLIC PANEL
ALBUMIN: 3.9 g/dL (ref 3.6–5.1)
ALT: 26 U/L (ref 9–46)
AST: 19 U/L (ref 10–35)
Alkaline Phosphatase: 96 U/L (ref 40–115)
BUN: 15 mg/dL (ref 7–25)
CHLORIDE: 97 mmol/L — AB (ref 98–110)
CO2: 29 mmol/L (ref 20–31)
Calcium: 9.1 mg/dL (ref 8.6–10.3)
Creatinine, Ser: 0.93 mg/dL (ref 0.70–1.33)
Glucose, Bld: 134 mg/dL — ABNORMAL HIGH (ref 65–99)
POTASSIUM: 3.9 mmol/L (ref 3.5–5.3)
Sodium: 137 mmol/L (ref 135–146)
TOTAL PROTEIN: 7.1 g/dL (ref 6.1–8.1)
Total Bilirubin: 0.7 mg/dL (ref 0.2–1.2)

## 2015-05-04 LAB — CBC WITH DIFFERENTIAL (CANCER CENTER ONLY)
BASO#: 0.1 10*3/uL (ref 0.0–0.2)
BASO%: 0.8 % (ref 0.0–2.0)
EOS ABS: 0.3 10*3/uL (ref 0.0–0.5)
EOS%: 4.6 % (ref 0.0–7.0)
HCT: 46.2 % (ref 38.7–49.9)
HGB: 15.3 g/dL (ref 13.0–17.1)
LYMPH#: 1.3 10*3/uL (ref 0.9–3.3)
LYMPH%: 20 % (ref 14.0–48.0)
MCH: 28.2 pg (ref 28.0–33.4)
MCHC: 33.1 g/dL (ref 32.0–35.9)
MCV: 85 fL (ref 82–98)
MONO#: 0.5 10*3/uL (ref 0.1–0.9)
MONO%: 7.3 % (ref 0.0–13.0)
NEUT#: 4.4 10*3/uL (ref 1.5–6.5)
NEUT%: 67.3 % (ref 40.0–80.0)
PLATELETS: 155 10*3/uL (ref 145–400)
RBC: 5.42 10*6/uL (ref 4.20–5.70)
RDW: 13.8 % (ref 11.1–15.7)
WBC: 6.6 10*3/uL (ref 4.0–10.0)

## 2015-05-04 NOTE — Patient Instructions (Signed)

## 2015-05-04 NOTE — Progress Notes (Signed)
Hematology and Oncology Follow Up Visit  MALE MEATH [id number] [birth year] 54 y.o. 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 Pooler is here today for a follow-up. He is having some joint pain. He is still avoiding having surgery on his right knee and thumb because he can not be out of work for an extended period of time. He states that he wears a pain patch on his back that does give him some relief.  No numbness or tingling in his extremities.  He is still smoking 1 ppd. He is having a hard time trying to quit.  No  fever, chills, night sweats, n/v, cough, rash, dizziness, headches,  blurred vision, SOB, chest pain, palpitations, abdominal pain or changes in bowel or bladder habits.  He is staying well hydrated.   Medications:    Medication List       This list is accurate as of: 05/04/15  8:37 PM.  Always use your most recent med list.               ALPRAZolam 1 MG tablet  Commonly known as:  XANAX  Take 1 tablet (1 mg total) by mouth 4 (four) times daily.     amLODipine 5 MG tablet  Commonly known as:  NORVASC  Take 5 mg by mouth daily.     aspirin 81 MG tablet  Take 1 tablet (81 mg total) by mouth daily.     calcium-vitamin D 500-200 MG-UNIT tablet  Take 1 tablet by mouth daily.     esomeprazole 40 MG capsule  Commonly known as:  NEXIUM     Fish Oil 1000 MG Caps  Take by mouth every morning.     hydrochlorothiazide 25 MG tablet  Commonly known as:  HYDRODIURIL     morphine 30 MG tablet  Commonly known as:  MSIR  Take 1-2 pills, as needed, for pain every 4-6 hours     multivitamin-lutein Caps capsule  Take 1 capsule by mouth daily.     oxymorphone 20 MG 12 hr tablet  Commonly known as:  OPANA ER  Take 1 tablet (20 mg total) by mouth every 8 (eight) hours.     polyethylene glycol packet  Commonly known  as:  MIRALAX / GLYCOLAX  Take 17 g by mouth daily.     pyridOXINE 100 MG tablet  Commonly known as:  VITAMIN B-6  Take 100 mg by mouth daily.     VIAGRA 50 MG tablet  Generic drug:  sildenafil     vitamin C 500 MG tablet  Commonly known as:  ASCORBIC ACID  Take 500 mg by mouth daily.        Allergies: No Known Allergies  Past Medical History, Surgical history, Social history, and Family History were reviewed and updated.  Review of Systems: All other 10 point review of systems is negative.   Physical Exam:  height is 6' (1.829 m) and weight is 240 lb (108.863 kg). His oral temperature is 97.9 F (36.6 C). His blood pressure is 151/91 and his pulse is 92. His respiration is 16.   Wt Readings from Last 3 Encounters:  05/04/15 240 lb (108.863 kg)  11/29/14 238 lb (107.956 kg)  10/11/14 244 lb (110.678 kg)    Ocular: Sclerae unicteric, pupils equal, round and reactive to light Ear-nose-throat: Oropharynx clear, dentition fair  Lymphatic: No cervical supraclavicular or axillary adenopathy Lungs no rales or rhonchi, good excursion bilaterally Heart regular rate and rhythm, no murmur appreciated Abd soft, nontender, positive bowel sounds MSK no focal spinal tenderness, no joint edema Neuro: non-focal, well-oriented, appropriate affect Breasts: Deferred  Lab Results  Component Value Date   WBC 6.6 05/04/2015   HGB 15.3 05/04/2015   HCT 46.2 05/04/2015   MCV 85 05/04/2015   PLT 155 05/04/2015   Lab Results  Component Value Date   FERRITIN 15* 11/29/2014   IRON 27* 11/29/2014   TIBC 466* 11/29/2014   UIBC 440* 11/29/2014   IRONPCTSAT 6* 11/29/2014   Lab Results  Component Value Date   RETICCTPCT 1.4 02/22/2011   RBC 5.42 05/04/2015   RETICCTABS 77.0 02/22/2011   No results found for: KPAFRELGTCHN, LAMBDASER, KAPLAMBRATIO No results found for: IGGSERUM, IGA, IGMSERUM No results found for: Odetta Pink,  SPEI   Chemistry      Component Value Date/Time   NA 137 11/29/2014 1419   NA 141 06/14/2014 1510   K 4.0 11/29/2014 1419   K 4.3 06/14/2014 1510   CL 100 11/29/2014 1419   CL 100 06/14/2014 1510   CO2 24 11/29/2014 1419   CO2 29 06/14/2014 1510   BUN 14 11/29/2014 1419   BUN 13 06/14/2014 1510   CREATININE 1.10 11/29/2014 1419   CREATININE 1.1 06/14/2014 1510      Component Value Date/Time   CALCIUM 9.3 11/29/2014 1419   CALCIUM 9.0 06/14/2014 1510   ALKPHOS 98 11/29/2014 1419   ALKPHOS 84 06/14/2014 1510   AST 19 11/29/2014 1419   AST 22 06/14/2014 1510   ALT 21 11/29/2014 1419   ALT 24 06/14/2014 1510   BILITOT 0.3 11/29/2014 1419   BILITOT 0.60 06/14/2014 1510     Impression and Plan: Mr. Hession is 54 year old gentleman with polycythemia - JAK2 negative. He is symptomatic with mild fatigue and joint pain.  His Hct today is 46.2. We will phlebotomize him today.  We will plan to see him back in 2 months for labs and follow-up. He knows to call with any questions or concerns. We can certainly see him sooner if need be.   Eliezer Bottom, NP 11/30/20168:37 PM

## 2015-05-05 LAB — IRON AND TIBC CHCC
%SAT: 16 % — AB (ref 20–55)
Iron: 73 ug/dL (ref 42–163)
TIBC: 456 ug/dL — ABNORMAL HIGH (ref 202–409)
UIBC: 384 ug/dL — AB (ref 117–376)

## 2015-05-05 LAB — FERRITIN CHCC: Ferritin: 35 ng/ml (ref 22–316)

## 2015-05-20 ENCOUNTER — Other Ambulatory Visit: Payer: Self-pay | Admitting: *Deleted

## 2015-05-20 DIAGNOSIS — S63591S Other specified sprain of right wrist, sequela: Secondary | ICD-10-CM

## 2015-05-20 DIAGNOSIS — M47817 Spondylosis without myelopathy or radiculopathy, lumbosacral region: Secondary | ICD-10-CM

## 2015-05-20 MED ORDER — MORPHINE SULFATE 30 MG PO TABS: ORAL_TABLET | ORAL | Status: DC

## 2015-05-20 MED ORDER — OXYMORPHONE HCL ER 20 MG PO TB12: 20.0000 mg | ORAL_TABLET | Freq: Three times a day (TID) | ORAL | Status: DC

## 2015-05-28 IMAGING — DX DG HAND COMPLETE 3+V*R*
3 series · 3 of 3 positions shown · non-contrast
Comparison: 04/13/2014

CLINICAL DATA: Slipped and fell on some ice at his house about 2
weeks ago. He tried to catch himself. Pain and swelling at base of
thumb into hand.

EXAM:
RIGHT HAND - COMPLETE 3+ VIEW

[hand pa]
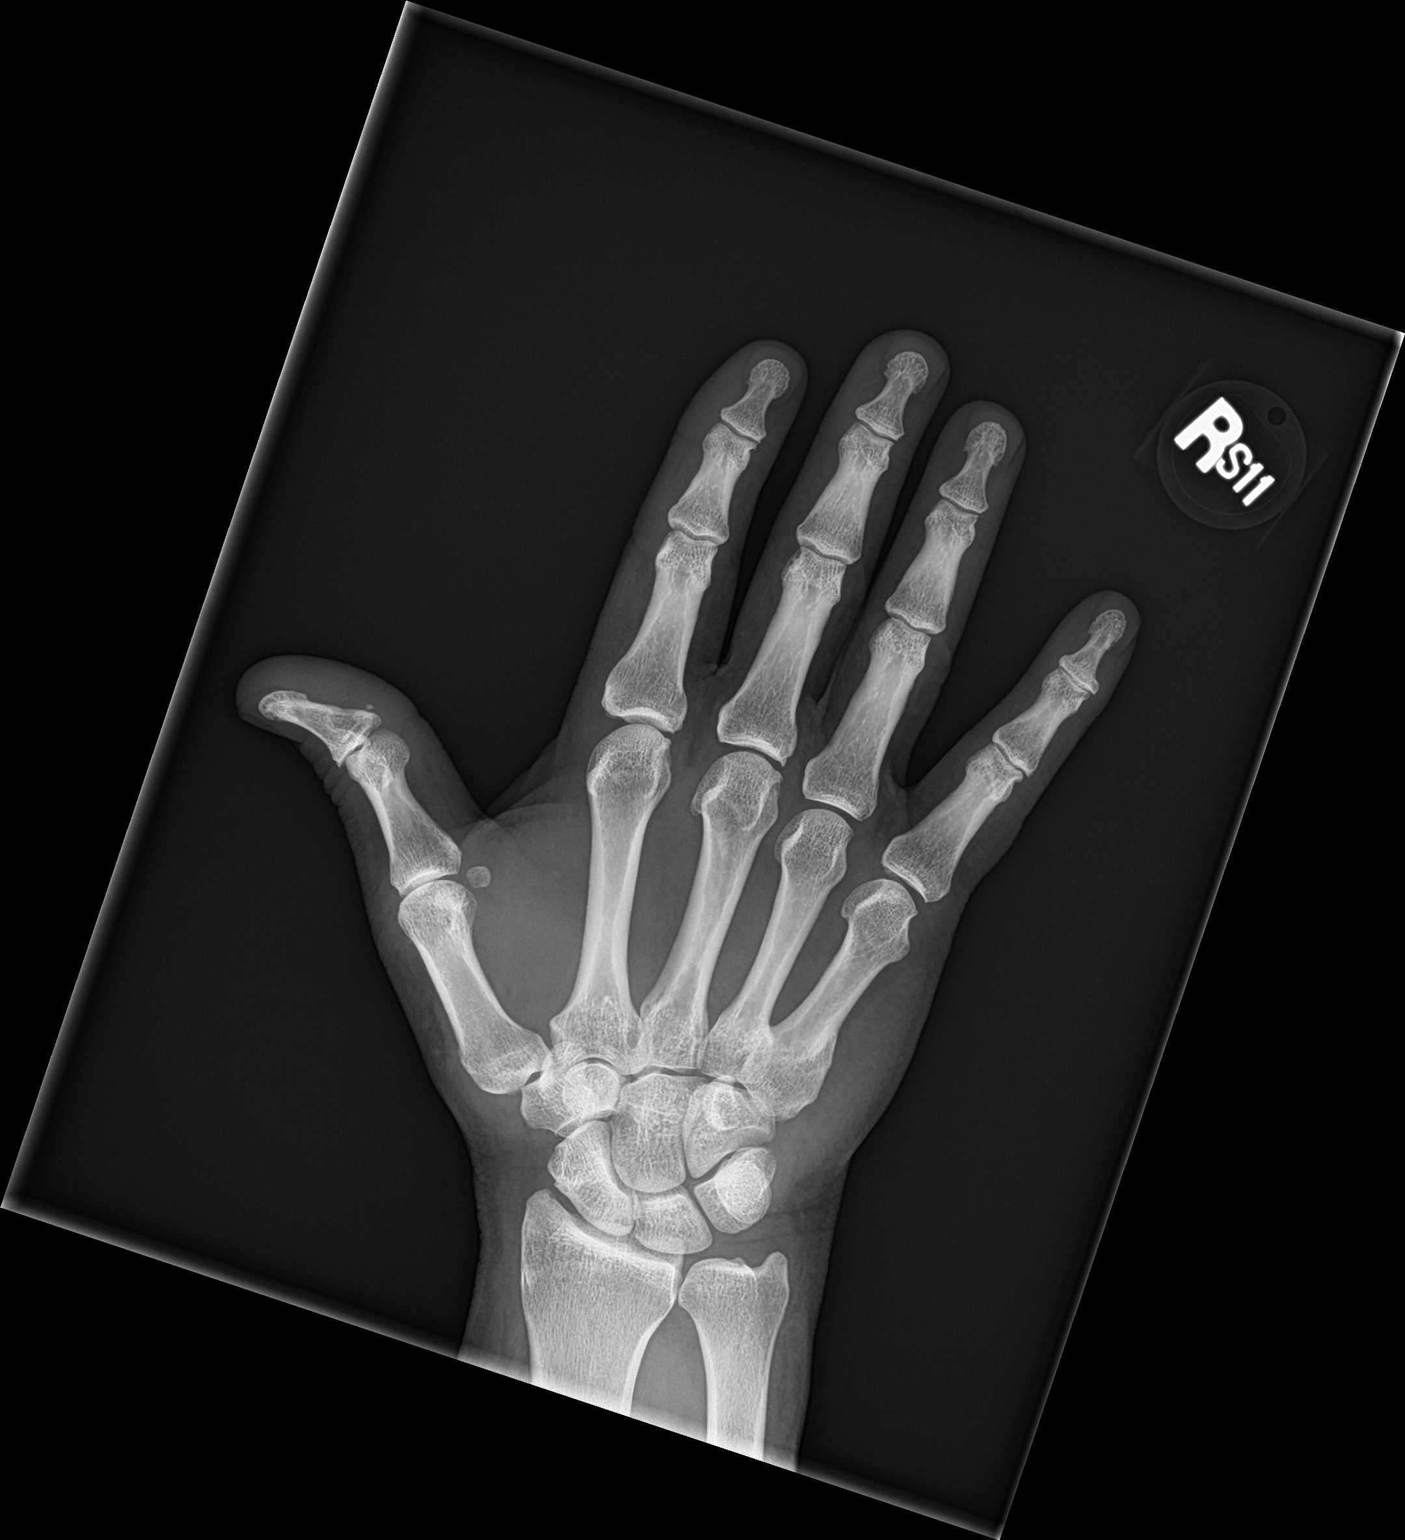

[hand obl]
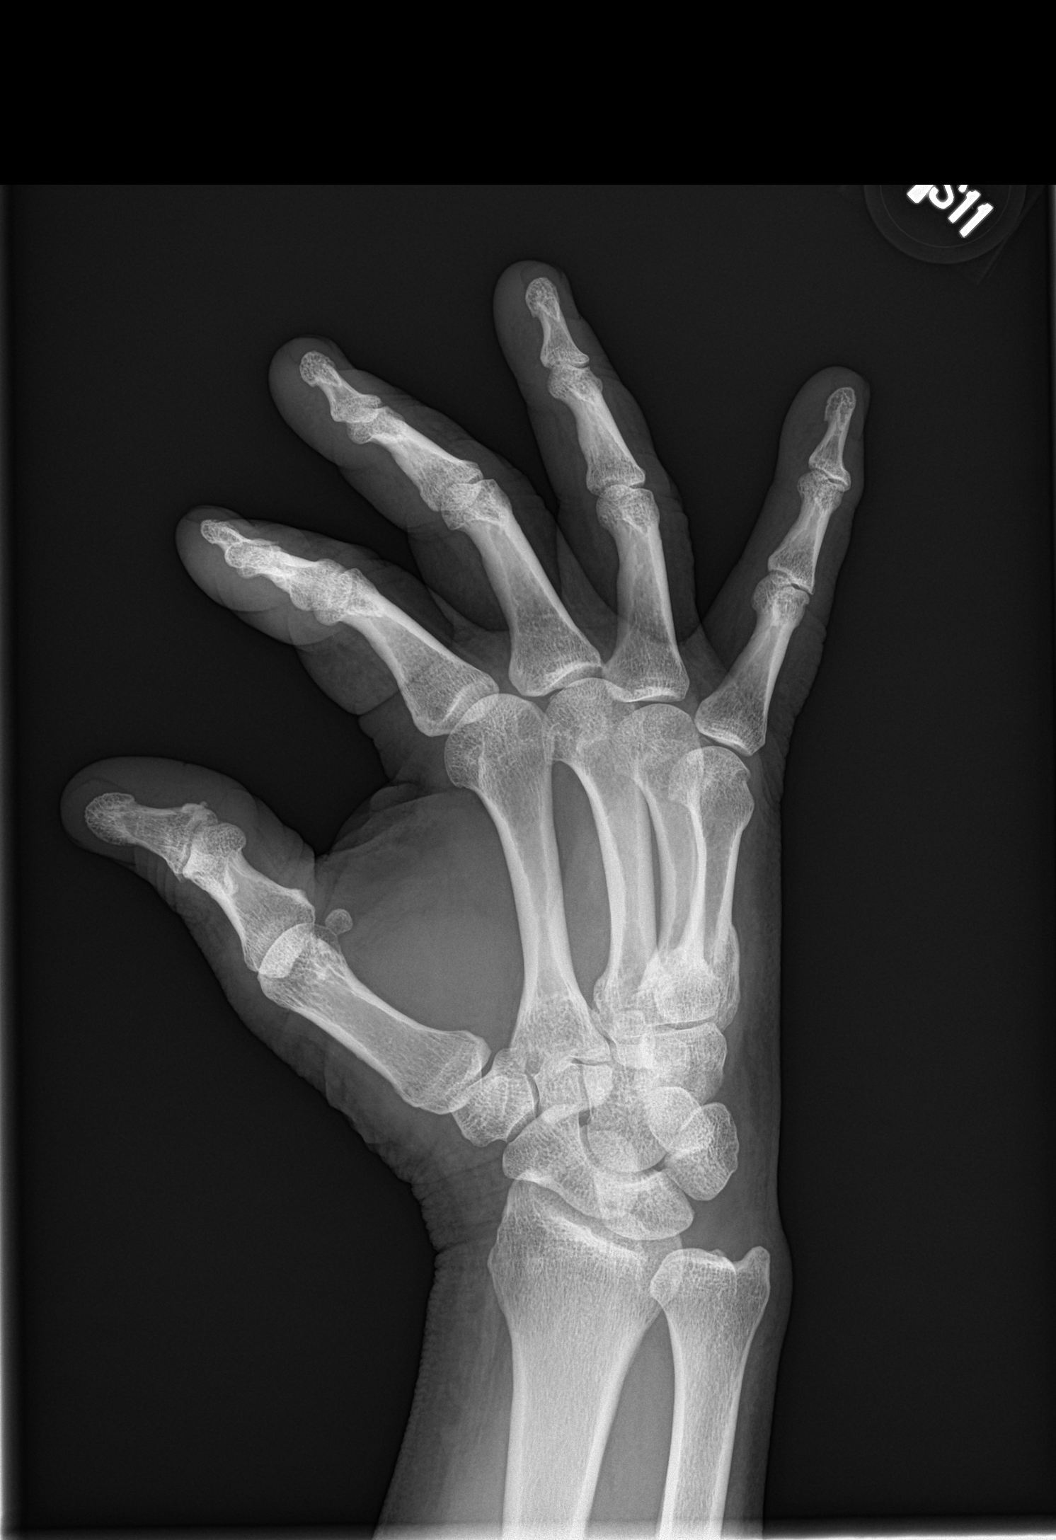

[hand lat]
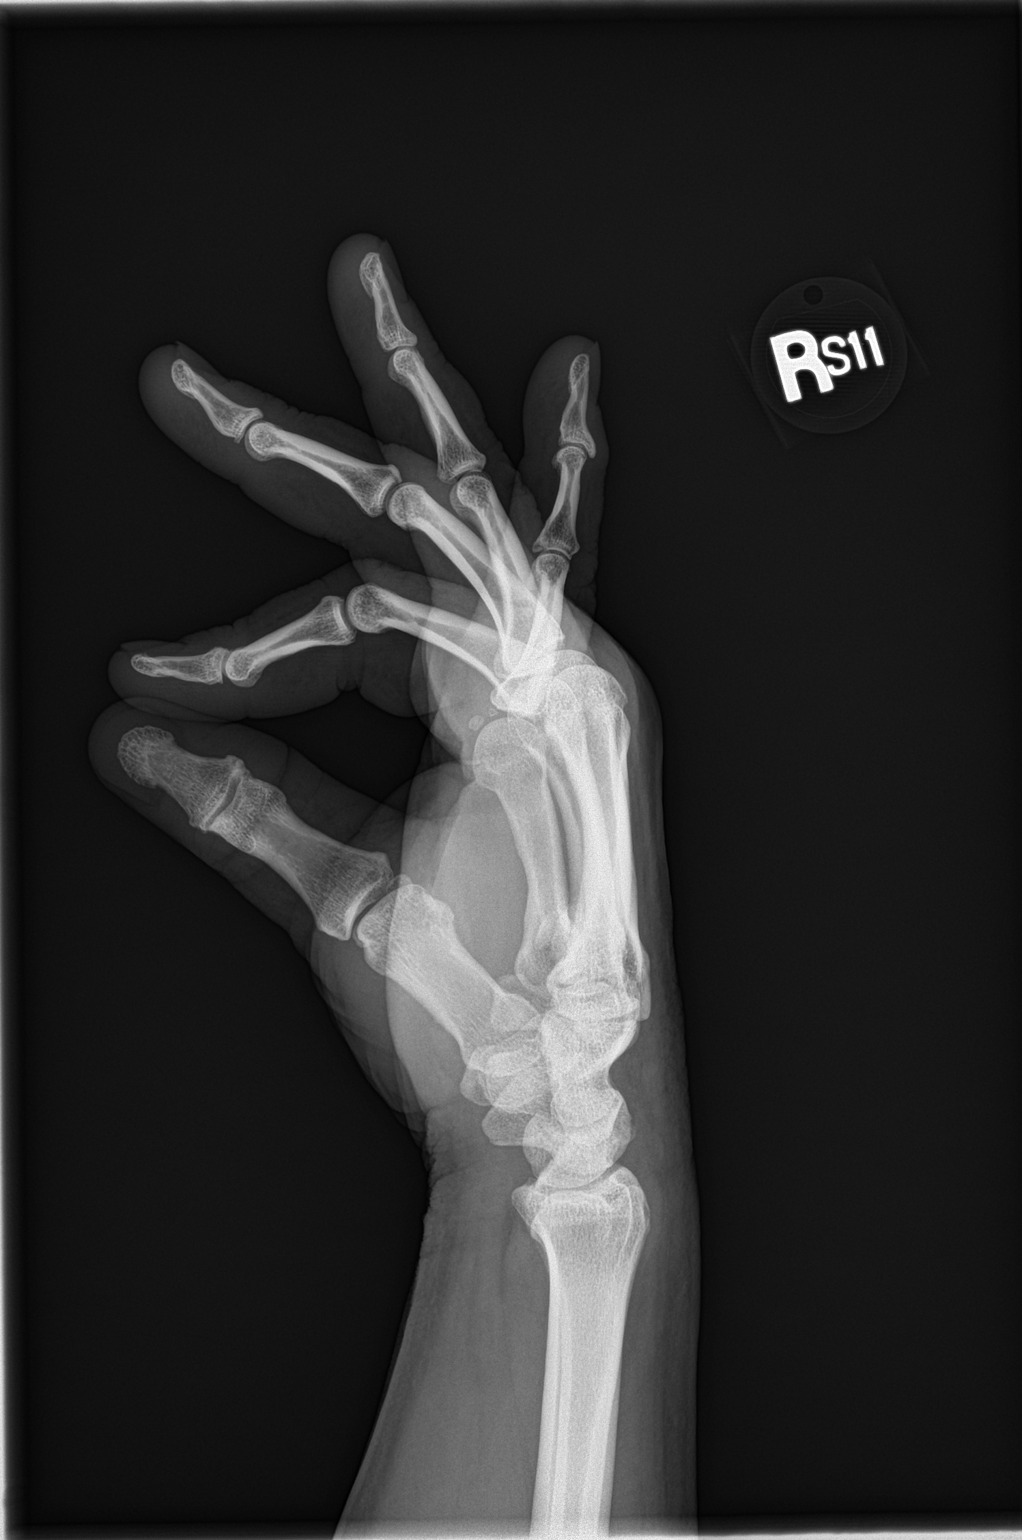

[3 of 3 positions shown; findings below may reference images not displayed]

FINDINGS: No acute fracture. Joints are normally space and aligned. There is
an old, healed fracture of the base of the fifth metacarpal. Mild
spurring is noted at the second and third metacarpophalangeal
joints. Soft tissues are unremarkable.
IMPRESSION: No fracture or acute finding.

## 2015-06-21 ENCOUNTER — Other Ambulatory Visit: Payer: Self-pay | Admitting: *Deleted

## 2015-06-21 DIAGNOSIS — S63591S Other specified sprain of right wrist, sequela: Secondary | ICD-10-CM

## 2015-06-21 DIAGNOSIS — M47817 Spondylosis without myelopathy or radiculopathy, lumbosacral region: Secondary | ICD-10-CM

## 2015-06-21 MED ORDER — OXYMORPHONE HCL ER 20 MG PO TB12: 20.0000 mg | ORAL_TABLET | Freq: Three times a day (TID) | ORAL | Status: DC

## 2015-06-21 MED ORDER — MORPHINE SULFATE 30 MG PO TABS: ORAL_TABLET | ORAL | Status: DC

## 2015-07-04 ENCOUNTER — Ambulatory Visit: Payer: Managed Care, Other (non HMO) | Admitting: Family

## 2015-07-04 ENCOUNTER — Other Ambulatory Visit: Payer: Managed Care, Other (non HMO)

## 2015-07-07 ENCOUNTER — Other Ambulatory Visit: Payer: Self-pay | Admitting: *Deleted

## 2015-07-07 DIAGNOSIS — D45 Polycythemia vera: Secondary | ICD-10-CM

## 2015-07-14 ENCOUNTER — Ambulatory Visit: Payer: Managed Care, Other (non HMO)

## 2015-07-14 ENCOUNTER — Encounter: Payer: Self-pay | Admitting: Family

## 2015-07-14 ENCOUNTER — Other Ambulatory Visit: Payer: Self-pay | Admitting: *Deleted

## 2015-07-14 ENCOUNTER — Ambulatory Visit (HOSPITAL_BASED_OUTPATIENT_CLINIC_OR_DEPARTMENT_OTHER): Payer: Managed Care, Other (non HMO) | Admitting: Family

## 2015-07-14 ENCOUNTER — Other Ambulatory Visit (HOSPITAL_BASED_OUTPATIENT_CLINIC_OR_DEPARTMENT_OTHER): Payer: Managed Care, Other (non HMO)

## 2015-07-14 VITALS — BP 175/83 | HR 89 | Temp 97.4°F | Resp 18 | Ht 72.0 in | Wt 250.0 lb

## 2015-07-14 DIAGNOSIS — D45 Polycythemia vera: Secondary | ICD-10-CM | POA: Diagnosis not present

## 2015-07-14 DIAGNOSIS — I10 Essential (primary) hypertension: Secondary | ICD-10-CM

## 2015-07-14 DIAGNOSIS — M47817 Spondylosis without myelopathy or radiculopathy, lumbosacral region: Secondary | ICD-10-CM

## 2015-07-14 DIAGNOSIS — J329 Chronic sinusitis, unspecified: Secondary | ICD-10-CM

## 2015-07-14 DIAGNOSIS — S63591S Other specified sprain of right wrist, sequela: Secondary | ICD-10-CM

## 2015-07-14 DIAGNOSIS — J321 Chronic frontal sinusitis: Secondary | ICD-10-CM

## 2015-07-14 LAB — CBC WITH DIFFERENTIAL (CANCER CENTER ONLY)
BASO#: 0.1 10*3/uL (ref 0.0–0.2)
BASO%: 0.7 % (ref 0.0–2.0)
EOS%: 3.2 % (ref 0.0–7.0)
Eosinophils Absolute: 0.3 10*3/uL (ref 0.0–0.5)
HCT: 41.4 % (ref 38.7–49.9)
HEMOGLOBIN: 13.6 g/dL (ref 13.0–17.1)
LYMPH#: 1.4 10*3/uL (ref 0.9–3.3)
LYMPH%: 16.9 % (ref 14.0–48.0)
MCH: 28 pg (ref 28.0–33.4)
MCHC: 32.9 g/dL (ref 32.0–35.9)
MCV: 85 fL (ref 82–98)
MONO#: 0.6 10*3/uL (ref 0.1–0.9)
MONO%: 7.6 % (ref 0.0–13.0)
NEUT%: 71.6 % (ref 40.0–80.0)
NEUTROS ABS: 6.1 10*3/uL (ref 1.5–6.5)
Platelets: 223 10*3/uL (ref 145–400)
RBC: 4.85 10*6/uL (ref 4.20–5.70)
RDW: 13.6 % (ref 11.1–15.7)
WBC: 8.5 10*3/uL (ref 4.0–10.0)

## 2015-07-14 LAB — COMPREHENSIVE METABOLIC PANEL
ALBUMIN: 3.7 g/dL (ref 3.5–5.0)
ALT: 36 U/L (ref 0–55)
AST: 22 U/L (ref 5–34)
Alkaline Phosphatase: 96 U/L (ref 40–150)
Anion Gap: 12 mEq/L — ABNORMAL HIGH (ref 3–11)
BUN: 12.3 mg/dL (ref 7.0–26.0)
CALCIUM: 8.8 mg/dL (ref 8.4–10.4)
CHLORIDE: 102 meq/L (ref 98–109)
CO2: 27 mEq/L (ref 22–29)
CREATININE: 1 mg/dL (ref 0.7–1.3)
EGFR: 85 mL/min/{1.73_m2} — ABNORMAL LOW (ref >90)
GLUCOSE: 111 mg/dL (ref 70–140)
Potassium: 3.7 mEq/L (ref 3.5–5.1)
SODIUM: 141 meq/L (ref 136–145)
Total Bilirubin: 0.45 mg/dL (ref 0.20–1.20)
Total Protein: 7.1 g/dL (ref 6.4–8.3)

## 2015-07-14 LAB — IRON AND TIBC
%SAT: 11 % — ABNORMAL LOW (ref 20–55)
Iron: 48 ug/dL (ref 42–163)
TIBC: 438 ug/dL — ABNORMAL HIGH (ref 202–409)
UIBC: 389 ug/dL — ABNORMAL HIGH (ref 117–376)

## 2015-07-14 LAB — FERRITIN: Ferritin: 22 ng/ml (ref 22–316)

## 2015-07-14 MED ORDER — MORPHINE SULFATE 30 MG PO TABS: ORAL_TABLET | ORAL | Status: DC

## 2015-07-14 MED ORDER — AZITHROMYCIN 250 MG PO TABS: ORAL_TABLET | ORAL | Status: DC

## 2015-07-14 MED ORDER — OXYMORPHONE HCL ER 20 MG PO TB12: 20.0000 mg | ORAL_TABLET | Freq: Three times a day (TID) | ORAL | Status: DC

## 2015-07-14 NOTE — Progress Notes (Signed)
Patient does not need a phlebotomy today per Laverna Peace NP.

## 2015-07-14 NOTE — Progress Notes (Signed)
Hematology and Oncology Follow Up Visit  Jacob Bradford [id number] 1960/06/09 55 y.o. 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 He c/o a sinus infection today. He states that he has had brown phlegm at times. He is requesting an antibiotic today.  He is still smoking 1-1 /12 ppd. He is having a hard time trying to quit but states that he is still trying.  He has had some issues with hypertension and his medication was adjusted by his PCP. Unfortunately he states this caused him some problems "with his love life" so he cut it back. He follows up with his PCP next week and plans to discuss this with him.  No fever, chills, night sweats, n/v, cough, rash, dizziness, headches,  blurred vision, SOB, chest pain, palpitations, abdominal pain or changes in bowel or bladder habits.  His appetite is good and he is staying well hydrated. His weight is up 10 lbs with this visit and he states that he plans to try and lose weight.   Medications:    Medication List       This list is accurate as of: 07/14/15  1:19 PM.  Always use your most recent med list.               ALPRAZolam 1 MG tablet  Commonly known as:  XANAX  Take 1 tablet (1 mg total) by mouth 4 (four) times daily.     amLODipine 5 MG tablet  Commonly known as:  NORVASC  Take 5 mg by mouth daily.     aspirin 81 MG tablet  Take 1 tablet (81 mg total) by mouth daily.     azithromycin 250 MG tablet  Commonly known as:  ZITHROMAX Z-PAK  Take as directed on package.     calcium-vitamin D 500-200 MG-UNIT tablet  Take 1 tablet by mouth daily.     esomeprazole 40 MG capsule  Commonly known as:  NEXIUM     Fish Oil 1000 MG Caps  Take by mouth every morning.     hydrochlorothiazide 25 MG tablet  Commonly known as:  HYDRODIURIL     morphine 30  MG tablet  Commonly known as:  MSIR  Take 1-2 pills, as needed, for pain every 4-6 hours.  Do not fill until 07/20/2015     multivitamin-lutein Caps capsule  Take 1 capsule by mouth daily.     oxymorphone 20 MG 12 hr tablet  Commonly known as:  OPANA ER  Take 1 tablet (20 mg total) by mouth every 8 (eight) hours. Do not fill until 07/20/2015     polyethylene glycol packet  Commonly known as:  MIRALAX / GLYCOLAX  Take 17 g by mouth daily.     pyridOXINE 100 MG tablet  Commonly known as:  VITAMIN B-6  Take 100 mg by mouth daily.     VIAGRA 50 MG tablet  Generic drug:  sildenafil     vitamin C 500 MG tablet  Commonly known as:  ASCORBIC ACID  Take 500 mg by mouth daily.        Allergies: No Known Allergies  Past Medical History, Surgical history, Social history, and Family History were reviewed and updated.  Review of Systems: All other 10 point review of systems is negative.   Physical Exam:  height  is 6' (1.829 m) and weight is 250 lb (113.399 kg). His oral temperature is 97.4 F (36.3 C). His blood pressure is 175/83 and his pulse is 89. His respiration is 18.   Wt Readings from Last 3 Encounters:  07/14/15 250 lb (113.399 kg)  05/04/15 240 lb (108.863 kg)  11/29/14 238 lb (107.956 kg)    Ocular: Sclerae unicteric, pupils equal, round and reactive to light Ear-nose-throat: Oropharynx clear, dentition fair Lymphatic: No cervical supraclavicular or axillary adenopathy Lungs no rales or rhonchi, good excursion bilaterally Heart regular rate and rhythm, no murmur appreciated Abd soft, nontender, positive bowel sounds, no liver or spleen tip palpated on exam MSK no focal spinal tenderness, no joint edema Neuro: non-focal, well-oriented, appropriate affect Breasts: Deferred  Lab Results  Component Value Date   WBC 8.5 07/14/2015   HGB 13.6 07/14/2015   HCT 41.4 07/14/2015   MCV 85 07/14/2015   PLT 223 07/14/2015   Lab Results  Component Value Date   FERRITIN  35 05/04/2015   IRON 73 05/04/2015   TIBC 456* 05/04/2015   UIBC 384* 05/04/2015   IRONPCTSAT 16* 05/04/2015   Lab Results  Component Value Date   RETICCTPCT 1.4 02/22/2011   RBC 4.85 07/14/2015   RETICCTABS 77.0 02/22/2011   No results found for: KPAFRELGTCHN, LAMBDASER, KAPLAMBRATIO No results found for: IGGSERUM, IGA, IGMSERUM No results found for: Odetta Pink, SPEI   Chemistry      Component Value Date/Time   NA 137 05/04/2015 1511   NA 141 06/14/2014 1510   K 3.9 05/04/2015 1511   K 4.3 06/14/2014 1510   CL 97* 05/04/2015 1511   CL 100 06/14/2014 1510   CO2 29 05/04/2015 1511   CO2 29 06/14/2014 1510   BUN 15 05/04/2015 1511   BUN 13 06/14/2014 1510   CREATININE 0.93 05/04/2015 1511   CREATININE 1.1 06/14/2014 1510      Component Value Date/Time   CALCIUM 9.1 05/04/2015 1511   CALCIUM 9.0 06/14/2014 1510   ALKPHOS 96 05/04/2015 1511   ALKPHOS 84 06/14/2014 1510   AST 19 05/04/2015 1511   AST 22 06/14/2014 1510   ALT 26 05/04/2015 1511   ALT 24 06/14/2014 1510   BILITOT 0.7 05/04/2015 1511   BILITOT 0.60 06/14/2014 1510     Impression and Plan: Mr. Taing is 55 year old gentleman with polycythemia - JAK2 negative. He is doing fairly well with the exception of a sinus infection and HTN. A prescription for a z-pack was sent to his pharmacy today. He will be following up with his PCP next week regarding the HTN.  His Hct today is 41.4 so he will not need a phlebotomy this visit.  He will continue taking his 1 baby aspirin daily.  We will plan to see him back in 2 months for labs and follow-up. He knows to call with any questions or concerns. We can certainly see him sooner if need be.   Eliezer Bottom, NP 2/9/20171:19 PM

## 2015-07-15 LAB — HEMOGLOBIN A1C
Est. average glucose Bld gHb Est-mCnc: 120 mg/dL
Hemoglobin A1c: 5.8 % — ABNORMAL HIGH (ref 4.8–5.6)

## 2015-08-17 ENCOUNTER — Other Ambulatory Visit: Payer: Self-pay | Admitting: *Deleted

## 2015-08-17 DIAGNOSIS — S63591S Other specified sprain of right wrist, sequela: Secondary | ICD-10-CM

## 2015-08-17 DIAGNOSIS — M47817 Spondylosis without myelopathy or radiculopathy, lumbosacral region: Secondary | ICD-10-CM

## 2015-08-17 MED ORDER — MORPHINE SULFATE 30 MG PO TABS: ORAL_TABLET | ORAL | Status: DC

## 2015-08-17 MED ORDER — OXYMORPHONE HCL ER 20 MG PO TB12: 20.0000 mg | ORAL_TABLET | Freq: Three times a day (TID) | ORAL | Status: DC

## 2015-09-14 ENCOUNTER — Other Ambulatory Visit: Payer: Self-pay | Admitting: *Deleted

## 2015-09-14 ENCOUNTER — Ambulatory Visit (HOSPITAL_BASED_OUTPATIENT_CLINIC_OR_DEPARTMENT_OTHER): Payer: Managed Care, Other (non HMO)

## 2015-09-14 ENCOUNTER — Encounter: Payer: Self-pay | Admitting: Family

## 2015-09-14 ENCOUNTER — Ambulatory Visit (HOSPITAL_BASED_OUTPATIENT_CLINIC_OR_DEPARTMENT_OTHER): Payer: Managed Care, Other (non HMO) | Admitting: Family

## 2015-09-14 ENCOUNTER — Encounter: Payer: Self-pay | Admitting: *Deleted

## 2015-09-14 ENCOUNTER — Other Ambulatory Visit (HOSPITAL_BASED_OUTPATIENT_CLINIC_OR_DEPARTMENT_OTHER): Payer: Managed Care, Other (non HMO)

## 2015-09-14 VITALS — BP 157/86 | HR 83 | Temp 97.8°F | Resp 16 | Ht 72.0 in | Wt 244.0 lb

## 2015-09-14 DIAGNOSIS — M545 Low back pain: Secondary | ICD-10-CM

## 2015-09-14 DIAGNOSIS — D45 Polycythemia vera: Secondary | ICD-10-CM

## 2015-09-14 DIAGNOSIS — M25511 Pain in right shoulder: Secondary | ICD-10-CM | POA: Diagnosis not present

## 2015-09-14 DIAGNOSIS — M47817 Spondylosis without myelopathy or radiculopathy, lumbosacral region: Secondary | ICD-10-CM

## 2015-09-14 DIAGNOSIS — S63591S Other specified sprain of right wrist, sequela: Secondary | ICD-10-CM

## 2015-09-14 DIAGNOSIS — M25512 Pain in left shoulder: Secondary | ICD-10-CM | POA: Diagnosis not present

## 2015-09-14 LAB — CBC WITH DIFFERENTIAL (CANCER CENTER ONLY)
BASO#: 0 10*3/uL (ref 0.0–0.2)
BASO%: 0.6 % (ref 0.0–2.0)
EOS ABS: 0.2 10*3/uL (ref 0.0–0.5)
EOS%: 3.4 % (ref 0.0–7.0)
HCT: 45.9 % (ref 38.7–49.9)
HEMOGLOBIN: 15.6 g/dL (ref 13.0–17.1)
LYMPH#: 1.3 10*3/uL (ref 0.9–3.3)
LYMPH%: 18.8 % (ref 14.0–48.0)
MCH: 29.1 pg (ref 28.0–33.4)
MCHC: 34 g/dL (ref 32.0–35.9)
MCV: 86 fL (ref 82–98)
MONO#: 0.8 10*3/uL (ref 0.1–0.9)
MONO%: 11.1 % (ref 0.0–13.0)
NEUT%: 66.1 % (ref 40.0–80.0)
NEUTROS ABS: 4.6 10*3/uL (ref 1.5–6.5)
PLATELETS: 223 10*3/uL (ref 145–400)
RBC: 5.37 10*6/uL (ref 4.20–5.70)
RDW: 14.5 % (ref 11.1–15.7)
WBC: 7 10*3/uL (ref 4.0–10.0)

## 2015-09-14 LAB — COMPREHENSIVE METABOLIC PANEL
ALBUMIN: 3.6 g/dL (ref 3.5–5.0)
ALK PHOS: 98 U/L (ref 40–150)
ALT: 17 U/L (ref 0–55)
AST: 17 U/L (ref 5–34)
Anion Gap: 6 mEq/L (ref 3–11)
BILIRUBIN TOTAL: 0.35 mg/dL (ref 0.20–1.20)
BUN: 17.7 mg/dL (ref 7.0–26.0)
CO2: 29 meq/L (ref 22–29)
CREATININE: 1.1 mg/dL (ref 0.7–1.3)
Calcium: 9.5 mg/dL (ref 8.4–10.4)
Chloride: 104 mEq/L (ref 98–109)
EGFR: 72 mL/min/{1.73_m2} — AB (ref >90)
GLUCOSE: 80 mg/dL (ref 70–140)
Potassium: 4.3 mEq/L (ref 3.5–5.1)
SODIUM: 140 meq/L (ref 136–145)
TOTAL PROTEIN: 7.1 g/dL (ref 6.4–8.3)

## 2015-09-14 MED ORDER — OXYMORPHONE HCL ER 20 MG PO TB12: 20.0000 mg | ORAL_TABLET | Freq: Three times a day (TID) | ORAL | Status: DC

## 2015-09-14 MED ORDER — MORPHINE SULFATE 30 MG PO TABS: ORAL_TABLET | ORAL | Status: DC

## 2015-09-14 NOTE — Patient Instructions (Signed)

## 2015-09-14 NOTE — Progress Notes (Signed)
Hematology and Oncology Follow Up Visit  Jacob Bradford [id number] [birth year] 55 y.o. 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 He is doing well but still having pain in his shoulders and lower back. He has had trouble lifting 75 lb bags at work. It has been over a year since his last steroid injection.  He has numbness and tingling in his hands and feet that comes and goes.  No fever, chills, night sweats, n/v, cough, rash, dizziness, headches,  blurred vision, SOB, chest pain, palpitations, abdominal pain or changes in bowel or bladder habits.  His appetite is good and he is staying well hydrated. He is walking and working out in his yard each day. His weight is down 6 lbs since his last visit.  He has really cut back on his smoking. He is using a vape with no nicotine and is smoking < 1 ppd.   Medications:    Medication List       This list is accurate as of: 09/14/15  2:24 PM.  Always use your most recent med list.               ALPRAZolam 1 MG tablet  Commonly known as:  XANAX  Take 1 tablet (1 mg total) by mouth 4 (four) times daily.     amLODipine 5 MG tablet  Commonly known as:  NORVASC  Take 5 mg by mouth daily.     aspirin 81 MG tablet  Take 1 tablet (81 mg total) by mouth daily.     azithromycin 250 MG tablet  Commonly known as:  ZITHROMAX Z-PAK  Take as directed on package.     benazepril 20 MG tablet  Commonly known as:  LOTENSIN     calcium-vitamin D 500-200 MG-UNIT tablet  Take 1 tablet by mouth daily.     diltiazem 120 MG 24 hr capsule  Commonly known as:  CARDIZEM CD     Fish Oil 1000 MG Caps  Take by mouth every morning.     hydrochlorothiazide 25 MG tablet  Commonly known as:  HYDRODIURIL     morphine 30 MG tablet  Commonly known as:  MSIR  Take 1-2 pills,  as needed, for pain every 4-6 hours.  Do not fill until 07/20/2015     multivitamin-lutein Caps capsule  Take 1 capsule by mouth daily.     omeprazole 20 MG capsule  Commonly known as:  PRILOSEC     oxymorphone 20 MG 12 hr tablet  Commonly known as:  OPANA ER  Take 1 tablet (20 mg total) by mouth every 8 (eight) hours. Do not fill until 07/20/2015     polyethylene glycol packet  Commonly known as:  MIRALAX / GLYCOLAX  Take 17 g by mouth daily.     pyridOXINE 100 MG tablet  Commonly known as:  VITAMIN B-6  Take 100 mg by mouth daily.     VIAGRA 50 MG tablet  Generic drug:  sildenafil     vitamin C 500 MG tablet  Commonly known as:  ASCORBIC ACID  Take 500 mg by mouth daily.        Allergies: No Known Allergies  Past Medical History, Surgical history, Social history, and Family History were reviewed and updated.  Review of Systems: All other 10 point  review of systems is negative.   Physical Exam:  height is 6' (1.829 m) and weight is 244 lb (110.678 kg). His oral temperature is 97.8 F (36.6 C). His blood pressure is 157/86 and his pulse is 83. His respiration is 16.   Wt Readings from Last 3 Encounters:  09/14/15 244 lb (110.678 kg)  07/14/15 250 lb (113.399 kg)  05/04/15 240 lb (108.863 kg)    Ocular: Sclerae unicteric, pupils equal, round and reactive to light Ear-nose-throat: Oropharynx clear, dentition fair Lymphatic: No cervical supraclavicular or axillary adenopathy Lungs no rales or rhonchi, good excursion bilaterally Heart regular rate and rhythm, no murmur appreciated Abd soft, nontender, positive bowel sounds, no liver or spleen tip palpated on exam, no fluid wave MSK no focal spinal tenderness, no joint edema Neuro: non-focal, well-oriented, appropriate affect Breasts: Deferred  Lab Results  Component Value Date   WBC 7.0 09/14/2015   HGB 15.6 09/14/2015   HCT 45.9 09/14/2015   MCV 86 09/14/2015   PLT 223 09/14/2015   Lab Results  Component  Value Date   FERRITIN 22 07/14/2015   IRON 48 07/14/2015   TIBC 438* 07/14/2015   UIBC 389* 07/14/2015   IRONPCTSAT 11* 07/14/2015   Lab Results  Component Value Date   RETICCTPCT 1.4 02/22/2011   RBC 5.37 09/14/2015   RETICCTABS 77.0 02/22/2011   No results found for: KPAFRELGTCHN, LAMBDASER, KAPLAMBRATIO No results found for: IGGSERUM, IGA, IGMSERUM No results found for: Odetta Pink, SPEI   Chemistry      Component Value Date/Time   NA 141 07/14/2015 1034   NA 137 05/04/2015 1511   NA 141 06/14/2014 1510   K 3.7 07/14/2015 1034   K 3.9 05/04/2015 1511   K 4.3 06/14/2014 1510   CL 97* 05/04/2015 1511   CL 100 06/14/2014 1510   CO2 27 07/14/2015 1034   CO2 29 05/04/2015 1511   CO2 29 06/14/2014 1510   BUN 12.3 07/14/2015 1034   BUN 15 05/04/2015 1511   BUN 13 06/14/2014 1510   CREATININE 1.0 07/14/2015 1034   CREATININE 0.93 05/04/2015 1511   CREATININE 1.1 06/14/2014 1510      Component Value Date/Time   CALCIUM 8.8 07/14/2015 1034   CALCIUM 9.1 05/04/2015 1511   CALCIUM 9.0 06/14/2014 1510   ALKPHOS 96 07/14/2015 1034   ALKPHOS 96 05/04/2015 1511   ALKPHOS 84 06/14/2014 1510   AST 22 07/14/2015 1034   AST 19 05/04/2015 1511   AST 22 06/14/2014 1510   ALT 36 07/14/2015 1034   ALT 26 05/04/2015 1511   ALT 24 06/14/2014 1510   BILITOT 0.45 07/14/2015 1034   BILITOT 0.7 05/04/2015 1511   BILITOT 0.60 06/14/2014 1510     Impression and Plan: Jacob Bradford is 55 year old gentleman with polycythemia - JAK2 negative.  He is having shoulder and lower back pain at this time. He has significantly cut back on his smoking.   His Hct today is 45.9 so we will proceed with his phlebotomy today.  He will continue taking his 1 baby aspirin daily.  Scripts for his pain medication refill were also given to the patient.  We will plan to see him back in 2 months for labs and follow-up. He knows to call with any questions or  concerns. We can certainly see him sooner if need be.   Jacob Bottom, NP 4/12/20172:24 PM

## 2015-09-15 ENCOUNTER — Ambulatory Visit: Payer: Managed Care, Other (non HMO) | Admitting: Family

## 2015-09-15 ENCOUNTER — Other Ambulatory Visit: Payer: Managed Care, Other (non HMO)

## 2015-09-15 LAB — IRON AND TIBC
%SAT: 10 % — AB (ref 20–55)
Iron: 44 ug/dL (ref 42–163)
TIBC: 444 ug/dL — AB (ref 202–409)
UIBC: 400 ug/dL — AB (ref 117–376)

## 2015-09-15 LAB — FERRITIN: Ferritin: 16 ng/ml — ABNORMAL LOW (ref 22–316)

## 2015-09-22 ENCOUNTER — Ambulatory Visit: Payer: Managed Care, Other (non HMO) | Admitting: Family

## 2015-09-22 ENCOUNTER — Other Ambulatory Visit: Payer: Managed Care, Other (non HMO)

## 2015-10-17 ENCOUNTER — Other Ambulatory Visit: Payer: Self-pay | Admitting: *Deleted

## 2015-10-17 DIAGNOSIS — S63591S Other specified sprain of right wrist, sequela: Secondary | ICD-10-CM

## 2015-10-17 DIAGNOSIS — M47817 Spondylosis without myelopathy or radiculopathy, lumbosacral region: Secondary | ICD-10-CM

## 2015-10-17 MED ORDER — OXYMORPHONE HCL ER 20 MG PO TB12: 20.0000 mg | ORAL_TABLET | Freq: Three times a day (TID) | ORAL | Status: DC

## 2015-10-17 MED ORDER — MORPHINE SULFATE 30 MG PO TABS: ORAL_TABLET | ORAL | Status: DC

## 2015-11-17 ENCOUNTER — Encounter: Payer: Self-pay | Admitting: Hematology & Oncology

## 2015-11-17 ENCOUNTER — Other Ambulatory Visit (HOSPITAL_BASED_OUTPATIENT_CLINIC_OR_DEPARTMENT_OTHER): Payer: Managed Care, Other (non HMO)

## 2015-11-17 ENCOUNTER — Ambulatory Visit: Payer: Managed Care, Other (non HMO)

## 2015-11-17 ENCOUNTER — Ambulatory Visit (HOSPITAL_BASED_OUTPATIENT_CLINIC_OR_DEPARTMENT_OTHER): Payer: Managed Care, Other (non HMO) | Admitting: Hematology & Oncology

## 2015-11-17 VITALS — BP 158/84 | HR 76 | Temp 97.9°F | Resp 14 | Ht 72.0 in | Wt 251.0 lb

## 2015-11-17 DIAGNOSIS — D45 Polycythemia vera: Secondary | ICD-10-CM

## 2015-11-17 DIAGNOSIS — S63591S Other specified sprain of right wrist, sequela: Secondary | ICD-10-CM

## 2015-11-17 DIAGNOSIS — M47817 Spondylosis without myelopathy or radiculopathy, lumbosacral region: Secondary | ICD-10-CM

## 2015-11-17 DIAGNOSIS — M545 Low back pain, unspecified: Secondary | ICD-10-CM

## 2015-11-17 LAB — COMPREHENSIVE METABOLIC PANEL
ALBUMIN: 3.5 g/dL (ref 3.5–5.0)
ALK PHOS: 77 U/L (ref 40–150)
ALT: 18 U/L (ref 0–55)
AST: 15 U/L (ref 5–34)
Anion Gap: 8 mEq/L (ref 3–11)
BILIRUBIN TOTAL: 0.71 mg/dL (ref 0.20–1.20)
BUN: 12.2 mg/dL (ref 7.0–26.0)
CO2: 24 mEq/L (ref 22–29)
Calcium: 8.8 mg/dL (ref 8.4–10.4)
Chloride: 106 mEq/L (ref 98–109)
Creatinine: 1 mg/dL (ref 0.7–1.3)
EGFR: 83 mL/min/{1.73_m2} — ABNORMAL LOW (ref >90)
GLUCOSE: 93 mg/dL (ref 70–140)
Potassium: 3.7 mEq/L (ref 3.5–5.1)
SODIUM: 139 meq/L (ref 136–145)
TOTAL PROTEIN: 6.6 g/dL (ref 6.4–8.3)

## 2015-11-17 LAB — CBC WITH DIFFERENTIAL (CANCER CENTER ONLY)
BASO#: 0 10*3/uL (ref 0.0–0.2)
BASO%: 0.4 % (ref 0.0–2.0)
EOS%: 3.1 % (ref 0.0–7.0)
Eosinophils Absolute: 0.2 10*3/uL (ref 0.0–0.5)
HCT: 41.7 % (ref 38.7–49.9)
HEMOGLOBIN: 14 g/dL (ref 13.0–17.1)
LYMPH#: 1.4 10*3/uL (ref 0.9–3.3)
LYMPH%: 19.9 % (ref 14.0–48.0)
MCH: 29 pg (ref 28.0–33.4)
MCHC: 33.6 g/dL (ref 32.0–35.9)
MCV: 87 fL (ref 82–98)
MONO#: 0.6 10*3/uL (ref 0.1–0.9)
MONO%: 8 % (ref 0.0–13.0)
NEUT%: 68.6 % (ref 40.0–80.0)
NEUTROS ABS: 4.7 10*3/uL (ref 1.5–6.5)
Platelets: 182 10*3/uL (ref 145–400)
RBC: 4.82 10*6/uL (ref 4.20–5.70)
RDW: 13.5 % (ref 11.1–15.7)
WBC: 6.9 10*3/uL (ref 4.0–10.0)

## 2015-11-17 MED ORDER — OXYMORPHONE HCL ER 20 MG PO TB12: 20.0000 mg | ORAL_TABLET | Freq: Three times a day (TID) | ORAL | Status: DC

## 2015-11-17 MED ORDER — MORPHINE SULFATE 30 MG PO TABS: ORAL_TABLET | ORAL | Status: DC

## 2015-11-17 MED ORDER — ALPRAZOLAM 1 MG PO TABS: 1.0000 mg | ORAL_TABLET | Freq: Three times a day (TID) | ORAL | Status: AC | PRN
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
## 2015-11-17 NOTE — Progress Notes (Signed)
No phlebotomy needed. HCT 41.7

## 2015-11-18 LAB — IRON AND TIBC
%SAT: 24 % (ref 20–55)
Iron: 98 ug/dL (ref 42–163)
TIBC: 414 ug/dL — ABNORMAL HIGH (ref 202–409)
UIBC: 315 ug/dL (ref 117–376)

## 2015-11-18 LAB — FERRITIN: FERRITIN: 16 ng/mL — AB (ref 22–316)

## 2015-12-13 ENCOUNTER — Other Ambulatory Visit: Payer: Self-pay | Admitting: *Deleted

## 2015-12-13 DIAGNOSIS — M47817 Spondylosis without myelopathy or radiculopathy, lumbosacral region: Secondary | ICD-10-CM

## 2015-12-13 DIAGNOSIS — S63591S Other specified sprain of right wrist, sequela: Secondary | ICD-10-CM

## 2015-12-13 DIAGNOSIS — M545 Low back pain, unspecified: Secondary | ICD-10-CM

## 2015-12-13 MED ORDER — OXYMORPHONE HCL ER 20 MG PO TB12: 20.0000 mg | ORAL_TABLET | Freq: Three times a day (TID) | ORAL | Status: DC

## 2015-12-13 MED ORDER — MORPHINE SULFATE 30 MG PO TABS: ORAL_TABLET | ORAL | Status: DC

## 2016-01-11 ENCOUNTER — Other Ambulatory Visit: Payer: Self-pay

## 2016-01-11 DIAGNOSIS — M47817 Spondylosis without myelopathy or radiculopathy, lumbosacral region: Secondary | ICD-10-CM

## 2016-01-11 DIAGNOSIS — S63591S Other specified sprain of right wrist, sequela: Secondary | ICD-10-CM

## 2016-01-11 DIAGNOSIS — M545 Low back pain, unspecified: Secondary | ICD-10-CM

## 2016-01-11 MED ORDER — OXYMORPHONE HCL ER 20 MG PO TB12: 20.0000 mg | ORAL_TABLET | Freq: Three times a day (TID) | ORAL | 0 refills | Status: DC

## 2016-01-11 MED ORDER — MORPHINE SULFATE 30 MG PO TABS: ORAL_TABLET | ORAL | 0 refills | Status: DC

## 2016-01-25 ENCOUNTER — Other Ambulatory Visit: Payer: Self-pay | Admitting: Neurosurgery

## 2016-02-07 ENCOUNTER — Other Ambulatory Visit: Payer: Self-pay | Admitting: *Deleted

## 2016-02-07 DIAGNOSIS — M545 Low back pain, unspecified: Secondary | ICD-10-CM

## 2016-02-07 DIAGNOSIS — S63591S Other specified sprain of right wrist, sequela: Secondary | ICD-10-CM

## 2016-02-07 DIAGNOSIS — M47817 Spondylosis without myelopathy or radiculopathy, lumbosacral region: Secondary | ICD-10-CM

## 2016-02-08 MED ORDER — OXYMORPHONE HCL ER 20 MG PO TB12: 20.0000 mg | ORAL_TABLET | Freq: Three times a day (TID) | ORAL | 0 refills | Status: DC

## 2016-02-08 MED ORDER — MORPHINE SULFATE 30 MG PO TABS: ORAL_TABLET | ORAL | 0 refills | Status: DC

## 2016-02-20 ENCOUNTER — Other Ambulatory Visit (HOSPITAL_BASED_OUTPATIENT_CLINIC_OR_DEPARTMENT_OTHER): Payer: Managed Care, Other (non HMO)

## 2016-02-20 ENCOUNTER — Ambulatory Visit (HOSPITAL_BASED_OUTPATIENT_CLINIC_OR_DEPARTMENT_OTHER): Payer: Managed Care, Other (non HMO)

## 2016-02-20 ENCOUNTER — Encounter: Payer: Self-pay | Admitting: Hematology & Oncology

## 2016-02-20 ENCOUNTER — Ambulatory Visit (HOSPITAL_BASED_OUTPATIENT_CLINIC_OR_DEPARTMENT_OTHER): Payer: Managed Care, Other (non HMO) | Admitting: Hematology & Oncology

## 2016-02-20 VITALS — BP 170/88 | HR 75 | Temp 98.0°F | Resp 16 | Ht 72.0 in | Wt 244.8 lb

## 2016-02-20 VITALS — BP 141/76 | HR 70 | Temp 98.1°F | Resp 18

## 2016-02-20 DIAGNOSIS — D45 Polycythemia vera: Secondary | ICD-10-CM | POA: Diagnosis not present

## 2016-02-20 DIAGNOSIS — M545 Low back pain, unspecified: Secondary | ICD-10-CM

## 2016-02-20 DIAGNOSIS — M47817 Spondylosis without myelopathy or radiculopathy, lumbosacral region: Secondary | ICD-10-CM

## 2016-02-20 DIAGNOSIS — M79605 Pain in left leg: Secondary | ICD-10-CM

## 2016-02-20 DIAGNOSIS — S63591S Other specified sprain of right wrist, sequela: Secondary | ICD-10-CM

## 2016-02-20 LAB — COMPREHENSIVE METABOLIC PANEL
ALBUMIN: 3.6 g/dL (ref 3.5–5.0)
ALK PHOS: 102 U/L (ref 40–150)
ALT: 24 U/L (ref 0–55)
ANION GAP: 8 meq/L (ref 3–11)
AST: 19 U/L (ref 5–34)
BUN: 19 mg/dL (ref 7.0–26.0)
CALCIUM: 8.8 mg/dL (ref 8.4–10.4)
CO2: 27 mEq/L (ref 22–29)
CREATININE: 1.1 mg/dL (ref 0.7–1.3)
Chloride: 104 mEq/L (ref 98–109)
EGFR: 77 mL/min/{1.73_m2} — ABNORMAL LOW (ref >90)
Glucose: 101 mg/dl (ref 70–140)
POTASSIUM: 3.8 meq/L (ref 3.5–5.1)
Sodium: 139 mEq/L (ref 136–145)
Total Bilirubin: 0.79 mg/dL (ref 0.20–1.20)
Total Protein: 7.2 g/dL (ref 6.4–8.3)

## 2016-02-20 LAB — CBC WITH DIFFERENTIAL (CANCER CENTER ONLY)
BASO#: 0.1 10*3/uL (ref 0.0–0.2)
BASO%: 0.7 % (ref 0.0–2.0)
EOS ABS: 0.3 10*3/uL (ref 0.0–0.5)
EOS%: 4.3 % (ref 0.0–7.0)
HCT: 45.5 % (ref 38.7–49.9)
HGB: 15.4 g/dL (ref 13.0–17.1)
LYMPH#: 1.5 10*3/uL (ref 0.9–3.3)
LYMPH%: 20.1 % (ref 14.0–48.0)
MCH: 28.9 pg (ref 28.0–33.4)
MCHC: 33.8 g/dL (ref 32.0–35.9)
MCV: 85 fL (ref 82–98)
MONO#: 0.7 10*3/uL (ref 0.1–0.9)
MONO%: 9.3 % (ref 0.0–13.0)
NEUT%: 65.6 % (ref 40.0–80.0)
NEUTROS ABS: 4.7 10*3/uL (ref 1.5–6.5)
PLATELETS: 162 10*3/uL (ref 145–400)
RBC: 5.33 10*6/uL (ref 4.20–5.70)
RDW: 14 % (ref 11.1–15.7)
WBC: 7.2 10*3/uL (ref 4.0–10.0)

## 2016-02-20 LAB — CHCC SATELLITE - SMEAR
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
## 2016-02-20 NOTE — Patient Instructions (Signed)
Therapeutic Phlebotomy, Care After  Refer to this sheet in the next few weeks. These instructions provide you with information about caring for yourself after your procedure. Your health care provider may also give you more specific instructions. Your treatment has been planned according to current medical practices, but problems sometimes occur. Call your health care provider if you have any problems or questions after your procedure.  WHAT TO EXPECT AFTER THE PROCEDURE  After your procedure, it is common to have:   Light-headedness or dizziness. You may feel faint.   Nausea.   Tiredness.  HOME CARE INSTRUCTIONS  Activities   Return to your normal activities as directed by your health care provider. Most people can go back to their normal activities right away.   Avoid strenuous physical activity and heavy lifting or pulling for about 5 hours after the procedure. Do not lift anything that is heavier than 10 lb (4.5 kg).   Athletes should avoid strenuous exercise for at least 12 hours.   Change positions slowly for the remainder of the day. This will help to prevent light-headedness or fainting.   If you feel light-headed, lie down until the feeling goes away.  Eating and Drinking   Be sure to eat well-balanced meals for the next 24 hours.   Drink enough fluid to keep your urine clear or pale yellow.   Avoid drinking alcohol on the day that you had the procedure.  Care of the Needle Insertion Site   Keep your bandage dry. You can remove the bandage after about 5 hours or as directed by your health care provider.   If you have bleeding from the needle insertion site, elevate your arm and press firmly on the site until the bleeding stops.   If you have bruising at the site, apply ice to the area:   Put ice in a plastic bag.   Place a towel between your skin and the bag.   Leave the ice on for 20 minutes, 2-3 times a day for the first 24 hours.   If the swelling does not go away after 24 hours, apply  a warm, moist washcloth to the area for 20 minutes, 2-3 times a day.  General Instructions   Avoid smoking for at least 30 minutes after the procedure.   Keep all follow-up visits as directed by your health care provider. It is important to continue with further therapeutic phlebotomy treatments as directed.  SEEK MEDICAL CARE IF:   You have redness, swelling, or pain at the needle insertion site.   You have fluid, blood, or pus coming from the needle insertion site.   You feel light-headed, dizzy, or nauseated, and the feeling does not go away.   You notice new bruising at the needle insertion site.   You feel weaker than normal.   You have a fever or chills.  SEEK IMMEDIATE MEDICAL CARE IF:   You have severe nausea or vomiting.   You have chest pain.   You have trouble breathing.    This information is not intended to replace advice given to you by your health care provider. Make sure you discuss any questions you have with your health care provider.    Document Released: 10/23/2010 Document Revised: 10/05/2014 Document Reviewed: 05/17/2014  Elsevier Interactive Patient Education 2016 [employer]

## 2016-02-20 NOTE — Progress Notes (Signed)
Jacob Bradford presents today for phlebotomy per MD orders. Phlebotomy procedure started at 1305 and ended at 1315 500 grams removed. Patient observed for 30 minutes after procedure without any incident. Patient tolerated procedure well. IV needle removed intact.

## 2016-02-21 LAB — IRON AND TIBC
%SAT: 20 % (ref 20–55)
Iron: 98 ug/dL (ref 42–163)
TIBC: 480 ug/dL — ABNORMAL HIGH (ref 202–409)
UIBC: 382 ug/dL — AB (ref 117–376)

## 2016-02-21 LAB — FERRITIN: Ferritin: 13 ng/ml — ABNORMAL LOW (ref 22–316)

## 2016-02-28 ENCOUNTER — Inpatient Hospital Stay (HOSPITAL_COMMUNITY)
Admission: RE | Admit: 2016-02-28 | Discharge: 2016-02-28 | Disposition: A | Discharge status: Home / Self Care | Payer: Managed Care, Other (non HMO) | Source: Ambulatory Visit

## 2016-03-05 ENCOUNTER — Other Ambulatory Visit: Payer: Self-pay | Admitting: *Deleted

## 2016-03-05 DIAGNOSIS — S63591S Other specified sprain of right wrist, sequela: Secondary | ICD-10-CM

## 2016-03-05 DIAGNOSIS — M47817 Spondylosis without myelopathy or radiculopathy, lumbosacral region: Secondary | ICD-10-CM

## 2016-03-05 MED ORDER — OXYMORPHONE HCL ER 20 MG PO TB12: 20.0000 mg | ORAL_TABLET | Freq: Three times a day (TID) | ORAL | 0 refills | Status: DC

## 2016-03-05 MED ORDER — MORPHINE SULFATE 30 MG PO TABS: ORAL_TABLET | ORAL | 0 refills | Status: DC

## 2016-03-06 ENCOUNTER — Encounter (HOSPITAL_COMMUNITY): Payer: Self-pay | Admitting: *Deleted

## 2016-03-06 NOTE — Anesthesia Preprocedure Evaluation (Addendum)
Anesthesia Evaluation  Patient identified by MRN, date of birth, ID band Patient awake    Reviewed: Allergy & Precautions, NPO status , Patient's Chart, lab work & pertinent test results  History of Anesthesia Complications Negative for: history of anesthetic complications  Airway Mallampati: II  TM Distance: >3 FB Neck ROM: Full    Dental no notable dental hx. (+) Dental Advisory Given   Pulmonary former smoker,    Pulmonary exam normal        Cardiovascular hypertension, Normal cardiovascular exam     Neuro/Psych PSYCHIATRIC DISORDERS Anxiety Depression    GI/Hepatic Neg liver ROS,   Endo/Other  negative endocrine ROS  Renal/GU negative Renal ROS     Musculoskeletal   Abdominal   Peds  Hematology Polycythemia vera   Anesthesia Other Findings   Reproductive/Obstetrics                            Anesthesia Physical Anesthesia Plan  ASA: III  Anesthesia Plan: General   Post-op Pain Management:    Induction: Intravenous  Airway Management Planned: Oral ETT  Additional Equipment:   Intra-op Plan:   Post-operative Plan: Extubation in OR  Informed Consent: I have reviewed the patients History and Physical, chart, labs and discussed the procedure including the risks, benefits and alternatives for the proposed anesthesia with the patient or authorized representative who has indicated his/her understanding and acceptance.   Dental advisory given  Plan Discussed with: CRNA, Anesthesiologist and Surgeon  Anesthesia Plan Comments:        Anesthesia Quick Evaluation

## 2016-03-06 NOTE — Progress Notes (Signed)
I spoke with Willeen Cass, NP regarding history of Polcythemia.  Levada Dy reviewed Dr Antonieta Pert notes and did not write any new orders.  I spoke with Lorriane Shire at Dr Ronnald Ramp office, she said that there is a peer to peer meeting to make decision on whether surgery will be tomorrow or not. Patient returned my call and said he is still taking Aspirin 81 mg.  I notified Vanessa.

## 2016-03-07 ENCOUNTER — Encounter (HOSPITAL_COMMUNITY): Payer: Self-pay | Admitting: Neurological Surgery

## 2016-03-07 ENCOUNTER — Inpatient Hospital Stay (HOSPITAL_COMMUNITY): Payer: Managed Care, Other (non HMO) | Admitting: Anesthesiology

## 2016-03-07 ENCOUNTER — Inpatient Hospital Stay (HOSPITAL_COMMUNITY)
Admission: RE | Admit: 2016-03-07 | Discharge: 2016-03-08 | DRG: 460 | Disposition: A | Discharge status: Home / Self Care | Payer: Managed Care, Other (non HMO) | Source: Ambulatory Visit | Attending: Neurological Surgery | Admitting: Neurological Surgery

## 2016-03-07 ENCOUNTER — Inpatient Hospital Stay (HOSPITAL_COMMUNITY): Payer: Managed Care, Other (non HMO)

## 2016-03-07 ENCOUNTER — Encounter (HOSPITAL_COMMUNITY)
Admission: RE | Disposition: A | Discharge status: Home / Self Care | Payer: Self-pay | Source: Ambulatory Visit | Attending: Neurological Surgery

## 2016-03-07 DIAGNOSIS — Z7982 Long term (current) use of aspirin: Secondary | ICD-10-CM

## 2016-03-07 DIAGNOSIS — M5137 Other intervertebral disc degeneration, lumbosacral region: Secondary | ICD-10-CM | POA: Diagnosis present

## 2016-03-07 DIAGNOSIS — M5126 Other intervertebral disc displacement, lumbar region: Secondary | ICD-10-CM | POA: Diagnosis present

## 2016-03-07 DIAGNOSIS — F419 Anxiety disorder, unspecified: Secondary | ICD-10-CM | POA: Diagnosis present

## 2016-03-07 DIAGNOSIS — Z981 Arthrodesis status: Secondary | ICD-10-CM

## 2016-03-07 DIAGNOSIS — I10 Essential (primary) hypertension: Secondary | ICD-10-CM | POA: Diagnosis present

## 2016-03-07 DIAGNOSIS — M545 Low back pain: Secondary | ICD-10-CM | POA: Diagnosis present

## 2016-03-07 DIAGNOSIS — M48061 Spinal stenosis, lumbar region without neurogenic claudication: Secondary | ICD-10-CM | POA: Diagnosis present

## 2016-03-07 DIAGNOSIS — Z79899 Other long term (current) drug therapy: Secondary | ICD-10-CM

## 2016-03-07 DIAGNOSIS — S63591S Other specified sprain of right wrist, sequela: Secondary | ICD-10-CM

## 2016-03-07 DIAGNOSIS — F329 Major depressive disorder, single episode, unspecified: Secondary | ICD-10-CM | POA: Diagnosis present

## 2016-03-07 DIAGNOSIS — Z87891 Personal history of nicotine dependence: Secondary | ICD-10-CM

## 2016-03-07 DIAGNOSIS — Z419 Encounter for procedure for purposes other than remedying health state, unspecified: Secondary | ICD-10-CM

## 2016-03-07 DIAGNOSIS — M47817 Spondylosis without myelopathy or radiculopathy, lumbosacral region: Secondary | ICD-10-CM

## 2016-03-07 HISTORY — PX: MAXIMUM ACCESS (MAS)POSTERIOR LUMBAR INTERBODY FUSION (PLIF) 2 LEVEL: SHX6369

## 2016-03-07 HISTORY — DX: Spinal stenosis, site unspecified: M48.00

## 2016-03-07 HISTORY — DX: Anxiety disorder, unspecified: F41.9

## 2016-03-07 LAB — BASIC METABOLIC PANEL
Anion gap: 9 (ref 5–15)
BUN: 11 mg/dL (ref 6–20)
CO2: 25 mmol/L (ref 22–32)
CREATININE: 0.96 mg/dL (ref 0.61–1.24)
Calcium: 9 mg/dL (ref 8.9–10.3)
Chloride: 105 mmol/L (ref 101–111)
Glucose, Bld: 117 mg/dL — ABNORMAL HIGH (ref 65–99)
Potassium: 3.9 mmol/L (ref 3.5–5.1)
Sodium: 139 mmol/L (ref 135–145)

## 2016-03-07 LAB — CBC WITH DIFFERENTIAL/PLATELET
BASOS ABS: 0.1 10*3/uL (ref 0.0–0.1)
BASOS PCT: 1 %
EOS ABS: 0.2 10*3/uL (ref 0.0–0.7)
Eosinophils Relative: 2 %
HCT: 46.1 % (ref 39.0–52.0)
HEMOGLOBIN: 14.8 g/dL (ref 13.0–17.0)
Lymphocytes Relative: 18 %
Lymphs Abs: 1.2 10*3/uL (ref 0.7–4.0)
MCH: 27.6 pg (ref 26.0–34.0)
MCHC: 32.1 g/dL (ref 30.0–36.0)
MCV: 85.8 fL (ref 78.0–100.0)
MONO ABS: 0.6 10*3/uL (ref 0.1–1.0)
MONOS PCT: 9 %
NEUTROS PCT: 70 %
Neutro Abs: 4.6 10*3/uL (ref 1.7–7.7)
PLATELETS: 240 10*3/uL (ref 150–400)
RBC: 5.37 MIL/uL (ref 4.22–5.81)
RDW: 13.7 % (ref 11.5–15.5)
WBC: 6.6 10*3/uL (ref 4.0–10.5)

## 2016-03-07 LAB — PROTIME-INR
INR: 0.91
Prothrombin Time: 12.2 seconds (ref 11.4–15.2)

## 2016-03-07 LAB — ABO/RH: ABO/RH(D): O POS

## 2016-03-07 LAB — TYPE AND SCREEN
ABO/RH(D): O POS
ANTIBODY SCREEN: NEGATIVE

## 2016-03-07 LAB — SURGICAL PCR SCREEN
MRSA, PCR: NEGATIVE
STAPHYLOCOCCUS AUREUS: NEGATIVE

## 2016-03-07 SURGERY — FOR MAXIMUM ACCESS (MAS) POSTERIOR LUMBAR INTERBODY FUSION (PLIF) 2 LEVEL
Anesthesia: General | Site: Spine Lumbar

## 2016-03-07 MED ORDER — SUCCINYLCHOLINE CHLORIDE 20 MG/ML IJ SOLN
INTRAMUSCULAR | Status: DC | PRN
  Administered 2016-03-07: 120 mg via INTRAVENOUS
  Administered 2016-03-07: 40 mg via INTRAVENOUS

## 2016-03-07 MED ORDER — LACTATED RINGERS IV SOLN
INTRAVENOUS | Status: DC | PRN
  Administered 2016-03-07 (×2): via INTRAVENOUS

## 2016-03-07 MED ORDER — ONDANSETRON HCL 4 MG/2ML IJ SOLN
INTRAMUSCULAR | Status: DC | PRN
  Administered 2016-03-07: 4 mg via INTRAVENOUS

## 2016-03-07 MED ORDER — VANCOMYCIN HCL 1000 MG IV SOLR
INTRAVENOUS | Status: DC | PRN
  Administered 2016-03-07: 1000 mg via TOPICAL

## 2016-03-07 MED ORDER — SODIUM CHLORIDE 0.9% FLUSH: 3.0000 mL | Freq: Two times a day (BID) | INTRAVENOUS | Status: DC

## 2016-03-07 MED ORDER — PROPOFOL 500 MG/50ML IV EMUL
INTRAVENOUS | Status: AC
  Filled 2016-03-07: qty 100

## 2016-03-07 MED ORDER — HYDROCHLOROTHIAZIDE 25 MG PO TABS
25.0000 mg | ORAL_TABLET | Freq: Every evening | ORAL | Status: DC
  Administered 2016-03-07: 25 mg via ORAL
  Filled 2016-03-07: qty 1

## 2016-03-07 MED ORDER — METHOCARBAMOL 1000 MG/10ML IJ SOLN
500.0000 mg | Freq: Four times a day (QID) | INTRAMUSCULAR | Status: DC | PRN
  Filled 2016-03-07: qty 5

## 2016-03-07 MED ORDER — MIDAZOLAM HCL 5 MG/5ML IJ SOLN
INTRAMUSCULAR | Status: DC | PRN
  Administered 2016-03-07: 2 mg via INTRAVENOUS

## 2016-03-07 MED ORDER — HYDROMORPHONE HCL 2 MG PO TABS
4.0000 mg | ORAL_TABLET | ORAL | Status: DC | PRN
  Administered 2016-03-07 – 2016-03-08 (×3): 4 mg via ORAL
  Filled 2016-03-07 (×3): qty 2

## 2016-03-07 MED ORDER — MORPHINE SULFATE (PF) 4 MG/ML IV SOLN: 0.0500 mg/kg | INTRAVENOUS | Status: DC | PRN

## 2016-03-07 MED ORDER — PHENYLEPHRINE HCL 10 MG/ML IJ SOLN
INTRAMUSCULAR | Status: DC | PRN
  Administered 2016-03-07: 50 ug/min via INTRAVENOUS

## 2016-03-07 MED ORDER — BUPIVACAINE HCL (PF) 0.25 % IJ SOLN
INTRAMUSCULAR | Status: AC
  Filled 2016-03-07: qty 30

## 2016-03-07 MED ORDER — THROMBIN 5000 UNITS EX SOLR
OROMUCOSAL | Status: DC | PRN
  Administered 2016-03-07: 5 mL via TOPICAL

## 2016-03-07 MED ORDER — THROMBIN 20000 UNITS EX SOLR
CUTANEOUS | Status: DC | PRN
  Administered 2016-03-07: 20 mL via TOPICAL

## 2016-03-07 MED ORDER — ACETAMINOPHEN 650 MG RE SUPP: 650.0000 mg | RECTAL | Status: DC | PRN

## 2016-03-07 MED ORDER — MORPHINE SULFATE ER 30 MG PO TBCR
30.0000 mg | EXTENDED_RELEASE_TABLET | Freq: Two times a day (BID) | ORAL | Status: DC
  Administered 2016-03-07 – 2016-03-08 (×2): 30 mg via ORAL
  Filled 2016-03-07 (×2): qty 1

## 2016-03-07 MED ORDER — CEFAZOLIN IN D5W 1 GM/50ML IV SOLN
1.0000 g | Freq: Three times a day (TID) | INTRAVENOUS | Status: AC
  Administered 2016-03-07 – 2016-03-08 (×2): 1 g via INTRAVENOUS
  Filled 2016-03-07 (×4): qty 50

## 2016-03-07 MED ORDER — MENTHOL 3 MG MT LOZG: 1.0000 | LOZENGE | OROMUCOSAL | Status: DC | PRN

## 2016-03-07 MED ORDER — ACETAMINOPHEN 325 MG PO TABS: 650.0000 mg | ORAL_TABLET | ORAL | Status: DC | PRN

## 2016-03-07 MED ORDER — MUPIROCIN 2 % EX OINT
1.0000 "application " | TOPICAL_OINTMENT | Freq: Once | CUTANEOUS | Status: AC
  Administered 2016-03-07: 1 via TOPICAL
  Filled 2016-03-07: qty 22

## 2016-03-07 MED ORDER — DEXAMETHASONE SODIUM PHOSPHATE 10 MG/ML IJ SOLN
10.0000 mg | INTRAMUSCULAR | Status: AC
  Administered 2016-03-07: 10 mg via INTRAVENOUS
  Filled 2016-03-07: qty 1

## 2016-03-07 MED ORDER — DIPHENHYDRAMINE HCL 25 MG PO CAPS
25.0000 mg | ORAL_CAPSULE | Freq: Every evening | ORAL | Status: DC | PRN
  Administered 2016-03-07: 25 mg via ORAL
  Filled 2016-03-07: qty 1

## 2016-03-07 MED ORDER — ARTIFICIAL TEARS OP OINT
TOPICAL_OINTMENT | OPHTHALMIC | Status: AC
  Filled 2016-03-07: qty 3.5

## 2016-03-07 MED ORDER — SODIUM CHLORIDE 0.9 % IV SOLN: 250.0000 mL | INTRAVENOUS | Status: DC

## 2016-03-07 MED ORDER — MIDAZOLAM HCL 2 MG/2ML IJ SOLN
INTRAMUSCULAR | Status: AC
  Filled 2016-03-07: qty 2

## 2016-03-07 MED ORDER — BUPIVACAINE HCL (PF) 0.25 % IJ SOLN
INTRAMUSCULAR | Status: DC | PRN
  Administered 2016-03-07: 17 mL

## 2016-03-07 MED ORDER — SUCCINYLCHOLINE CHLORIDE 200 MG/10ML IV SOSY
PREFILLED_SYRINGE | INTRAVENOUS | Status: AC
  Filled 2016-03-07: qty 10

## 2016-03-07 MED ORDER — CEFAZOLIN SODIUM-DEXTROSE 2-4 GM/100ML-% IV SOLN
2.0000 g | INTRAVENOUS | Status: AC
  Administered 2016-03-07 (×2): 2 g via INTRAVENOUS
  Filled 2016-03-07: qty 100

## 2016-03-07 MED ORDER — PROPOFOL 10 MG/ML IV BOLUS
INTRAVENOUS | Status: AC
  Filled 2016-03-07: qty 40

## 2016-03-07 MED ORDER — HYDROMORPHONE HCL 1 MG/ML IJ SOLN
0.2500 mg | INTRAMUSCULAR | Status: DC | PRN
  Administered 2016-03-07 (×4): 0.5 mg via INTRAVENOUS

## 2016-03-07 MED ORDER — FENTANYL CITRATE (PF) 100 MCG/2ML IJ SOLN
INTRAMUSCULAR | Status: AC
  Filled 2016-03-07: qty 4

## 2016-03-07 MED ORDER — METHOCARBAMOL 500 MG PO TABS
500.0000 mg | ORAL_TABLET | Freq: Four times a day (QID) | ORAL | Status: DC | PRN
  Administered 2016-03-07 – 2016-03-08 (×2): 500 mg via ORAL
  Filled 2016-03-07 (×2): qty 1

## 2016-03-07 MED ORDER — POTASSIUM CHLORIDE IN NACL 20-0.9 MEQ/L-% IV SOLN
INTRAVENOUS | Status: DC
  Administered 2016-03-07: 17:00:00 via INTRAVENOUS
  Filled 2016-03-07 (×2): qty 1000

## 2016-03-07 MED ORDER — PHENYLEPHRINE 40 MCG/ML (10ML) SYRINGE FOR IV PUSH (FOR BLOOD PRESSURE SUPPORT)
PREFILLED_SYRINGE | INTRAVENOUS | Status: AC
  Filled 2016-03-07: qty 10

## 2016-03-07 MED ORDER — FENTANYL CITRATE (PF) 100 MCG/2ML IJ SOLN
INTRAMUSCULAR | Status: DC | PRN
  Administered 2016-03-07 (×4): 50 ug via INTRAVENOUS

## 2016-03-07 MED ORDER — LIDOCAINE HCL (CARDIAC) 20 MG/ML IV SOLN
INTRAVENOUS | Status: DC | PRN
  Administered 2016-03-07: 100 mg via INTRAVENOUS

## 2016-03-07 MED ORDER — ARTIFICIAL TEARS OP OINT
TOPICAL_OINTMENT | OPHTHALMIC | Status: DC | PRN
  Administered 2016-03-07: 1 via OPHTHALMIC

## 2016-03-07 MED ORDER — MORPHINE SULFATE (PF) 2 MG/ML IV SOLN
1.0000 mg | INTRAVENOUS | Status: DC | PRN
  Administered 2016-03-07: 4 mg via INTRAVENOUS
  Administered 2016-03-07: 2 mg via INTRAVENOUS
  Administered 2016-03-08: 4 mg via INTRAVENOUS
  Filled 2016-03-07: qty 2
  Filled 2016-03-07: qty 1
  Filled 2016-03-07: qty 2

## 2016-03-07 MED ORDER — CHLORHEXIDINE GLUCONATE CLOTH 2 % EX PADS: 6.0000 | MEDICATED_PAD | Freq: Once | CUTANEOUS | Status: DC

## 2016-03-07 MED ORDER — PROPOFOL 500 MG/50ML IV EMUL
INTRAVENOUS | Status: DC | PRN
  Administered 2016-03-07: 100 ug/kg/min via INTRAVENOUS

## 2016-03-07 MED ORDER — VANCOMYCIN HCL 1000 MG IV SOLR
INTRAVENOUS | Status: AC
  Filled 2016-03-07: qty 1000

## 2016-03-07 MED ORDER — SODIUM CHLORIDE 0.9 % IJ SOLN
INTRAMUSCULAR | Status: AC
  Filled 2016-03-07: qty 10

## 2016-03-07 MED ORDER — LIDOCAINE 2% (20 MG/ML) 5 ML SYRINGE
INTRAMUSCULAR | Status: AC
  Filled 2016-03-07: qty 5

## 2016-03-07 MED ORDER — ASPIRIN 81 MG PO TABS: 81.0000 mg | ORAL_TABLET | Freq: Every day | ORAL | Status: DC

## 2016-03-07 MED ORDER — THROMBIN 5000 UNITS EX SOLR
CUTANEOUS | Status: AC
  Filled 2016-03-07: qty 5000

## 2016-03-07 MED ORDER — PANTOPRAZOLE SODIUM 40 MG PO TBEC
40.0000 mg | DELAYED_RELEASE_TABLET | Freq: Every day | ORAL | Status: DC
  Administered 2016-03-07: 40 mg via ORAL
  Filled 2016-03-07: qty 1

## 2016-03-07 MED ORDER — EPHEDRINE SULFATE 50 MG/ML IJ SOLN
INTRAMUSCULAR | Status: DC | PRN
  Administered 2016-03-07: 10 mg via INTRAVENOUS
  Administered 2016-03-07: 5 mg via INTRAVENOUS

## 2016-03-07 MED ORDER — HYDROMORPHONE HCL 1 MG/ML IJ SOLN
INTRAMUSCULAR | Status: AC
Start: 2016-03-07 — End: 2016-03-08
  Filled 2016-03-07: qty 1

## 2016-03-07 MED ORDER — SODIUM CHLORIDE 0.9 % IR SOLN
Status: DC | PRN
  Administered 2016-03-07: 500 mL

## 2016-03-07 MED ORDER — PROPOFOL 10 MG/ML IV BOLUS
INTRAVENOUS | Status: DC | PRN
  Administered 2016-03-07: 120 mg via INTRAVENOUS
  Administered 2016-03-07: 30 mg via INTRAVENOUS

## 2016-03-07 MED ORDER — SODIUM CHLORIDE 0.9% FLUSH: 3.0000 mL | INTRAVENOUS | Status: DC | PRN

## 2016-03-07 MED ORDER — PHENYLEPHRINE HCL 10 MG/ML IJ SOLN
INTRAMUSCULAR | Status: AC
  Filled 2016-03-07: qty 1

## 2016-03-07 MED ORDER — ONDANSETRON HCL 4 MG/2ML IJ SOLN: 4.0000 mg | INTRAMUSCULAR | Status: DC | PRN

## 2016-03-07 MED ORDER — METHOCARBAMOL 500 MG PO TABS
ORAL_TABLET | ORAL | Status: AC
  Filled 2016-03-07: qty 1

## 2016-03-07 MED ORDER — EPHEDRINE 5 MG/ML INJ
INTRAVENOUS | Status: AC
  Filled 2016-03-07: qty 10

## 2016-03-07 MED ORDER — SCOPOLAMINE 1 MG/3DAYS TD PT72
1.0000 | MEDICATED_PATCH | TRANSDERMAL | Status: DC
  Administered 2016-03-07: 1.5 mg via TRANSDERMAL
  Filled 2016-03-07: qty 1

## 2016-03-07 MED ORDER — HYDROMORPHONE HCL 1 MG/ML IJ SOLN
INTRAMUSCULAR | Status: AC
  Filled 2016-03-07: qty 1

## 2016-03-07 MED ORDER — DOCUSATE SODIUM 100 MG PO CAPS
100.0000 mg | ORAL_CAPSULE | Freq: Two times a day (BID) | ORAL | Status: DC
  Administered 2016-03-07 – 2016-03-08 (×2): 100 mg via ORAL
  Filled 2016-03-07 (×2): qty 1

## 2016-03-07 MED ORDER — ONDANSETRON HCL 4 MG/2ML IJ SOLN
INTRAMUSCULAR | Status: AC
  Filled 2016-03-07: qty 2

## 2016-03-07 MED ORDER — PROPOFOL 1000 MG/100ML IV EMUL
INTRAVENOUS | Status: AC
  Filled 2016-03-07: qty 400

## 2016-03-07 MED ORDER — ASPIRIN EC 81 MG PO TBEC
81.0000 mg | DELAYED_RELEASE_TABLET | Freq: Every day | ORAL | Status: DC
  Administered 2016-03-08: 81 mg via ORAL
  Filled 2016-03-07: qty 1

## 2016-03-07 MED ORDER — PHENYLEPHRINE HCL 10 MG/ML IJ SOLN
INTRAMUSCULAR | Status: DC | PRN
  Administered 2016-03-07: 120 ug via INTRAVENOUS
  Administered 2016-03-07: 80 ug via INTRAVENOUS

## 2016-03-07 MED ORDER — THROMBIN 20000 UNITS EX SOLR
CUTANEOUS | Status: AC
  Filled 2016-03-07: qty 20000

## 2016-03-07 MED ORDER — PHENOL 1.4 % MT LIQD: 1.0000 | OROMUCOSAL | Status: DC | PRN

## 2016-03-07 MED ORDER — 0.9 % SODIUM CHLORIDE (POUR BTL) OPTIME
TOPICAL | Status: DC | PRN
  Administered 2016-03-07: 1000 mL

## 2016-03-07 MED ORDER — CELECOXIB 200 MG PO CAPS
200.0000 mg | ORAL_CAPSULE | Freq: Two times a day (BID) | ORAL | Status: DC
  Administered 2016-03-07 – 2016-03-08 (×2): 200 mg via ORAL
  Filled 2016-03-07 (×2): qty 1

## 2016-03-07 MED FILL — Heparin Sodium (Porcine) Inj 1000 Unit/ML: INTRAMUSCULAR | Qty: 30 | Status: AC

## 2016-03-07 MED FILL — Sodium Chloride IV Soln 0.9%: INTRAVENOUS | Qty: 1000 | Status: AC

## 2016-03-07 SURGICAL SUPPLY — 74 items
BAG DECANTER FOR FLEXI CONT (MISCELLANEOUS) ×2 IMPLANT
BENZOIN TINCTURE PRP APPL 2/3 (GAUZE/BANDAGES/DRESSINGS) ×2 IMPLANT
BIT DRILL PLIF MAS 5.0MM DISP (DRILL) ×1 IMPLANT
BLADE CLIPPER SURG (BLADE) IMPLANT
BONE MATRIX OSTEOCEL PRO MED (Bone Implant) ×2 IMPLANT
BRUSH SCRUB EZ PLAIN DRY (MISCELLANEOUS) ×2 IMPLANT
BUR MATCHSTICK NEURO 3.0 LAGG (BURR) ×2 IMPLANT
CAGE SPINAL 15DEG 10X11X26 (Spine Construct) ×4 IMPLANT
CANISTER SUCT 3000ML PPV (MISCELLANEOUS) ×2 IMPLANT
CAP RELINE MOD TULIP RMM (Cap) ×4 IMPLANT
CLIP NEUROVISION LG (CLIP) ×2 IMPLANT
CONT SPEC 4OZ CLIKSEAL STRL BL (MISCELLANEOUS) ×2 IMPLANT
COVER BACK TABLE 24X17X13 BIG (DRAPES) IMPLANT
COVER BACK TABLE 60X90IN (DRAPES) ×2 IMPLANT
DERMABOND ADVANCED (GAUZE/BANDAGES/DRESSINGS) ×1
DERMABOND ADVANCED .7 DNX12 (GAUZE/BANDAGES/DRESSINGS) ×1 IMPLANT
DRAPE C-ARM 42X72 X-RAY (DRAPES) ×2 IMPLANT
DRAPE C-ARMOR (DRAPES) ×2 IMPLANT
DRAPE LAPAROTOMY 100X72X124 (DRAPES) ×2 IMPLANT
DRAPE POUCH INSTRU U-SHP 10X18 (DRAPES) ×2 IMPLANT
DRAPE SURG 17X23 STRL (DRAPES) ×2 IMPLANT
DRILL PLIF MAS 5.0MM DISP (DRILL) ×2
DRSG OPSITE POSTOP 4X6 (GAUZE/BANDAGES/DRESSINGS) ×2 IMPLANT
DURAPREP 26ML APPLICATOR (WOUND CARE) ×2 IMPLANT
ELECT REM PT RETURN 9FT ADLT (ELECTROSURGICAL) ×2
ELECTRODE REM PT RTRN 9FT ADLT (ELECTROSURGICAL) ×1 IMPLANT
EVACUATOR 1/8 PVC DRAIN (DRAIN) IMPLANT
GAUZE SPONGE 4X4 16PLY XRAY LF (GAUZE/BANDAGES/DRESSINGS) IMPLANT
GLOVE BIO SURGEON STRL SZ8 (GLOVE) ×4 IMPLANT
GLOVE BIOGEL PI IND STRL 7.0 (GLOVE) ×1 IMPLANT
GLOVE BIOGEL PI IND STRL 7.5 (GLOVE) ×2 IMPLANT
GLOVE BIOGEL PI IND STRL 8 (GLOVE) ×2 IMPLANT
GLOVE BIOGEL PI INDICATOR 7.0 (GLOVE) ×1
GLOVE BIOGEL PI INDICATOR 7.5 (GLOVE) ×2
GLOVE BIOGEL PI INDICATOR 8 (GLOVE) ×2
GLOVE ECLIPSE 7.5 STRL STRAW (GLOVE) ×6 IMPLANT
GLOVE INDICATOR 8.5 STRL (GLOVE) ×2 IMPLANT
GLOVE SS BIOGEL STRL SZ 7 (GLOVE) ×1 IMPLANT
GLOVE SUPERSENSE BIOGEL SZ 7 (GLOVE) ×1
GOWN STRL REUS W/ TWL LRG LVL3 (GOWN DISPOSABLE) ×1 IMPLANT
GOWN STRL REUS W/ TWL XL LVL3 (GOWN DISPOSABLE) ×2 IMPLANT
GOWN STRL REUS W/TWL 2XL LVL3 (GOWN DISPOSABLE) ×2 IMPLANT
GOWN STRL REUS W/TWL LRG LVL3 (GOWN DISPOSABLE) ×1
GOWN STRL REUS W/TWL XL LVL3 (GOWN DISPOSABLE) ×2
HEMOSTAT POWDER KIT SURGIFOAM (HEMOSTASIS) ×2 IMPLANT
INTERBODY TLX 7X11X25 15DEG (Spine Construct) ×2 IMPLANT
KIT BASIN OR (CUSTOM PROCEDURE TRAY) ×2 IMPLANT
KIT ROOM TURNOVER OR (KITS) ×2 IMPLANT
MILL MEDIUM DISP (BLADE) ×2 IMPLANT
MODULE NVM5 NEXT GEN EMG (NEEDLE) ×4 IMPLANT
NEEDLE HYPO 25X1 1.5 SAFETY (NEEDLE) ×2 IMPLANT
NS IRRIG 1000ML POUR BTL (IV SOLUTION) ×2 IMPLANT
PACK LAMINECTOMY NEURO (CUSTOM PROCEDURE TRAY) ×2 IMPLANT
PAD ARMBOARD 7.5X6 YLW CONV (MISCELLANEOUS) ×10 IMPLANT
PUTTY BONE ATTRAX 1CC CYLINDER (Putty) ×4 IMPLANT
ROD RELINE COCR LORD 5.0X65 (Rod) ×4 IMPLANT
SCREW LOCK RSS 4.5/5.0MM (Screw) ×12 IMPLANT
SCREW RELINE RMM 5.0X35MM 4S (Screw) ×4 IMPLANT
SCREW RELINE RMM 6.5X40 4S (Screw) ×4 IMPLANT
SCREW SHANK RELINE MOD 5.0X35 (Screw) ×4 IMPLANT
SPONGE LAP 4X18 X RAY DECT (DISPOSABLE) IMPLANT
SPONGE SURGIFOAM ABS GEL 100 (HEMOSTASIS) ×2 IMPLANT
STRIP CLOSURE SKIN 1/2X4 (GAUZE/BANDAGES/DRESSINGS) ×2 IMPLANT
SUT VIC AB 0 CT1 18XCR BRD8 (SUTURE) ×1 IMPLANT
SUT VIC AB 0 CT1 8-18 (SUTURE) ×1
SUT VIC AB 2-0 CP2 18 (SUTURE) ×2 IMPLANT
SUT VIC AB 3-0 SH 8-18 (SUTURE) ×4 IMPLANT
SYR 3ML LL SCALE MARK (SYRINGE) IMPLANT
TLX INTERBODY 7X11X25 15DEG (Spine Construct) ×4 IMPLANT
TOWEL OR 17X24 6PK STRL BLUE (TOWEL DISPOSABLE) ×2 IMPLANT
TOWEL OR 17X26 10 PK STRL BLUE (TOWEL DISPOSABLE) ×2 IMPLANT
TRAP SPECIMEN MUCOUS 40CC (MISCELLANEOUS) ×2 IMPLANT
TRAY FOLEY W/METER SILVER 16FR (SET/KITS/TRAYS/PACK) ×2 IMPLANT
WATER STERILE IRR 1000ML POUR (IV SOLUTION) ×2 IMPLANT

## 2016-03-07 NOTE — Transfer of Care (Signed)
Immediate Anesthesia Transfer of Care Note  Patient: Jacob Bradford  Procedure(s) Performed: Procedure(s): LUMBAR FOUR-FIVE,LUMBAR FIVE-SACRAL ONE MAXIMUM ACCESS (MAS) POSTERIOR LUMBAR INTERBODY FUSION (PLIF) 2 LEVEL (N/A)  Patient Location: PACU  Anesthesia Type:General  Level of Consciousness: awake, alert , oriented and sedated  Airway & Oxygen Therapy: Patient Spontanous Breathing and Patient connected to nasal cannula oxygen  Post-op Assessment: Report given to RN, Post -op Vital signs reviewed and stable and Patient moving all extremities  Post vital signs: Reviewed and stable  Last Vitals:  Vitals:   03/07/16 0747  BP: (!) 163/93  Pulse: 80  Resp: 18  Temp: 36.9 C    Last Pain:  Vitals:   03/07/16 0753  TempSrc:   PainSc: 6       Patients Stated Pain Goal: 2 ([id number])  Complications: No apparent anesthesia complications

## 2016-03-07 NOTE — Progress Notes (Signed)
Order for brace application while sitting, pt states he does not have brace and there is not an order to call [employer] with new order.  Will clarify in AM.

## 2016-03-07 NOTE — Anesthesia Procedure Notes (Signed)
Procedure Name: Intubation Date/Time: 03/07/2016 8:38 AM Performed by: Scheryl Darter Pre-anesthesia Checklist: Patient identified, Emergency Drugs available, Suction available and Patient being monitored Patient Re-evaluated:Patient Re-evaluated prior to inductionOxygen Delivery Method: Circle System Utilized Preoxygenation: Pre-oxygenation with 100% oxygen Intubation Type: IV induction Ventilation: Mask ventilation without difficulty Laryngoscope Size: Miller and 3 Grade View: Grade I Tube type: Oral Tube size: 8.0 mm Number of attempts: 1 Airway Equipment and Method: Stylet and Oral airway Placement Confirmation: ETT inserted through vocal cords under direct vision,  positive ETCO2 and breath sounds checked- equal and bilateral Secured at: 23 cm Tube secured with: Tape Dental Injury: Teeth and Oropharynx as per pre-operative assessment

## 2016-03-07 NOTE — Progress Notes (Addendum)
Patient requesting sleep aid and antiacid, on call paged. Waiting return call.  Call returned [phone], orders received for benadryl 25mg  po qhs prn and prilosec 20mg  hs (formulary equivalent to Protonix 40mg ).  Will continue to monitor.

## 2016-03-07 NOTE — Anesthesia Postprocedure Evaluation (Signed)
Anesthesia Post Note  Patient: Jacob Bradford  Procedure(s) Performed: Procedure(s) (LRB): LUMBAR FOUR-FIVE,LUMBAR FIVE-SACRAL ONE MAXIMUM ACCESS (MAS) POSTERIOR LUMBAR INTERBODY FUSION (PLIF) 2 LEVEL (N/A)  Patient location during evaluation: PACU Anesthesia Type: General Level of consciousness: sedated Pain management: pain level controlled Vital Signs Assessment: post-procedure vital signs reviewed and stable Respiratory status: spontaneous breathing and respiratory function stable Cardiovascular status: stable Anesthetic complications: no    Last Vitals:  Vitals:   03/07/16 1345 03/07/16 1531  BP:  (!) 146/73  Pulse:  71  Resp: 20 20  Temp:  36.6 C    Last Pain:  Vitals:   03/07/16 1549  TempSrc:   PainSc: 8                  [name] DANIEL

## 2016-03-07 NOTE — Op Note (Signed)
03/07/2016  1:30 PM  PATIENT:  Jacob Bradford  55 y.o. male  PRE-OPERATIVE DIAGNOSIS:  Degenerative disc disease, disc herniation, back and leg pain  POST-OPERATIVE DIAGNOSIS:  same  PROCEDURE:   1. Decompressive lumbar laminectomy L4-5 and L5-S1 requiring more work than would be required for a simple exposure of the disk for PLIF in order to adequately decompress the neural elements and address the spinal stenosis 2. Posterior lumbar interbody fusion L4-5 and L5-S1 using TLX expandable interbody cages packed with morcellized allograft and autograft 3. Posterior fixation L4-S1 inclusive using cortical pedicle screws.    SURGEON:  Sherley Bounds, MD  ASSISTANTS: Dr. Saintclair Halsted  ANESTHESIA:  General  EBL: 250 ml  Total I/O In: 2000 [I.V.:2000] Out: 470 [Urine:220; Blood:250]  BLOOD ADMINISTERED:none  DRAINS:none  INDICATION FOR PROCEDURE: this patient presented with a long history of low back pain. He then developed acute severe left leg pain. MRI showed severe degenerative disc disease at L5-S1 with a left-sided disc herniation with a free fragment at L4-5. He tried medical management without relief. I recommended decompression and instrumented fusion to address both his distal degeneration and his disc protrusion. Patient understood the risks, benefits, and alternatives and potential outcomes and wished to proceed.  PROCEDURE DETAILS:  The patient was brought to the operating room. After induction of generalized endotracheal anesthesia the patient was rolled into the prone position on chest rolls and all pressure points were padded. The patient's lumbar region was cleaned and then prepped with DuraPrep and draped in the usual sterile fashion. Anesthesia was injected and then a dorsal midline incision was made and carried down to the lumbosacral fascia. The fascia was opened and the paraspinous musculature was taken down in a subperiosteal fashion to expose L4-5 and L5-S1. A self-retaining  retractor was placed. Intraoperative fluoroscopy confirmed my level, and I started with placement of the L4 cortical pedicle screws. The pedicle screw entry zones were identified utilizing surface landmarks and  AP and lateral fluoroscopy. I scored the cortex with the high-speed drill and then used the hand drill and EMG monitoring to drill an upward and outward direction into the pedicle. I then tapped line to line, and the tap was also monitored. I then placed a 5.0 x 35 mm cortical pedicle screw into the pedicles of L4 bilaterally. I then turned my attention to the decompression and complete lumbar laminectomies, hemi- facetectomies, and foraminotomies were performed at L4-5 and L5-S1. The patient had significant spinal stenosis and this required more work than would be required for a simple exposure of the disc for posterior lumbar interbody fusion. Much more generous decompression was undertaken in order to adequately decompress the neural elements and address the patient's leg pain. The yellow ligament was removed to expose the underlying dura and nerve roots, and generous foraminotomies were performed to adequately decompress the neural elements. Both the exiting and traversing nerve roots were decompressed on both sides until a coronary dilator passed easily along the nerve roots. Once the decompression was complete, I turned my attention to the posterior lower lumbar interbody fusion. The epidural venous vasculature was coagulated and cut sharply. Disc space was incised and the initial discectomy was performed with pituitary rongeurs. The disc space was distracted with sequential distractors to a height of 10 mm at L4-5 and 8 mm at L5-S1. We then used a series of scrapers and shavers to prepare the endplates for fusion. The midline was prepared with Epstein curettes. Once the complete discectomy was finished,  we packed an appropriate sized peek interbody cage with local autograft and morcellized allograft,  gently retracted the nerve root, and tapped the cage into position at L4-5 and L5-S1.  Expandable interbody cages were used and expanded utilizing fluoroscopy. The cages were then post packed. The midline between the cages was packed with morselized autograft and allograft. We then turned our attention to the placement of the lower pedicle screws. The pedicle screw entry zones were identified utilizing surface landmarks and fluoroscopy. I drilled into each pedicle utilizing the hand drill and EMG monitoring, and tapped each pedicle with the appropriate tap. We palpated with a ball probe to assure no break in the cortex. We then placed 5.0 x 35 mm cortical pedicle screws into the pedicles bilaterally at L5 and 6.5 x 40 mm pedicle screws at S1. We then placed lordotic rods into the multiaxial screw heads of the pedicle screws and locked these in position with the locking caps and anti-torque device. We then checked our construct with AP and lateral fluoroscopy. Irrigated with copious amounts of bacitracin-containing saline solution. Placed a medium Hemovac drain through separate stab incision. Inspected the nerve roots once again to assure adequate decompression, lined to the dura with Gelfoam, and closed the muscle and the fascia with 0 Vicryl. Closed the subcutaneous tissues with 2-0 Vicryl and subcuticular tissues with 3-0 Vicryl. The skin was closed with benzoin and Steri-Strips. Dressing was then applied, the patient was awakened from general anesthesia and transported to the recovery room in stable condition. At the end of the procedure all sponge, needle and instrument counts were correct.   PLAN OF CARE: Admit to inpatient   PATIENT DISPOSITION:  PACU - hemodynamically stable.   Delay start of Pharmacological VTE agent (>24hrs) due to surgical blood loss or risk of bleeding:  yes

## 2016-03-07 NOTE — H&P (Signed)
Subjective: Patient is a 55 y.o. male admitted for PLIF. Onset of symptoms was several years ago, rapidly worsening since that time.  The pain is rated intense, and is located at the across the lower back and radiates to LLE. The pain is described as aching and occurs all day. The symptoms have been progressive. Symptoms are exacerbated by exercise. MRI or CT showed DDD/ HNP   Past Medical History:  Diagnosis Date  . Anxiety   . Depression   . Hypertension   . Lunotriquetral ligament tear 10/11/2014  . Polycythemia vera ([place])   . Polycythemia vera ([place])   . Spinal stenosis   . Tear of anterior cruciate ligament of knee 10/11/2014    Past Surgical History:  Procedure Laterality Date  . TONSILLECTOMY     and Adenoid    Prior to Admission medications   Medication Sig Start Date End Date Taking? Authorizing Provider  acetaminophen (TYLENOL) 500 MG tablet Take 1,000 mg by mouth every 6 (six) hours as needed for moderate pain.   Yes Historical Provider, MD  ALPRAZolam Duanne Moron) 1 MG tablet Take 1 tablet (1 mg total) by mouth 3 (three) times daily as needed for anxiety. Patient taking differently: Take 0.5-1 mg by mouth 3 (three) times daily as needed for anxiety.  11/17/15  Yes Volanda Napoleon, MD  aspirin 81 MG tablet Take 1 tablet (81 mg total) by mouth daily. 09/25/13  Yes Volanda Napoleon, MD  Aspirin-Acetaminophen-Caffeine (GOODY HEADACHE PO) Take 1 packet by mouth daily as needed (headaches).   Yes Historical Provider, MD  Calcium Carbonate-Vitamin D (CALCIUM-VITAMIN D) 500-200 MG-UNIT per tablet Take 1 tablet by mouth every evening.    Yes Historical Provider, MD  celecoxib (CELEBREX) 200 MG capsule Take 200 mg by mouth every evening.  11/10/15  Yes Historical Provider, MD  hydrochlorothiazide (HYDRODIURIL) 25 MG tablet Take 25 mg by mouth every evening.  10/28/14  Yes Historical Provider, MD  multivitamin-lutein (OCUVITE-LUTEIN) CAPS capsule Take 1 capsule by mouth every evening.    Yes Historical  Provider, MD  Omega-3 Fatty Acids (FISH OIL) 1000 MG CAPS Take 1,000 mg by mouth at bedtime.    Yes Historical Provider, MD  omeprazole (PRILOSEC) 20 MG capsule Take 20 mg by mouth every evening.  09/09/15  Yes Historical Provider, MD  polyethylene glycol (MIRALAX / GLYCOLAX) packet Take 17 g by mouth daily as needed for moderate constipation.   Yes Historical Provider, MD  pyridOXINE (VITAMIN B-6) 100 MG tablet Take 100 mg by mouth 2 (two) times daily at 8 am and 1 pm.    Yes Historical Provider, MD  sildenafil (VIAGRA) 25 MG tablet Take 25-100 mg by mouth daily as needed for erectile dysfunction.   Yes Historical Provider, MD  vitamin C (ASCORBIC ACID) 500 MG tablet Take 500 mg by mouth 2 (two) times daily at 8 am and 1 pm.    Yes Historical Provider, MD  morphine (MSIR) 30 MG tablet Take 1-2 pills, as needed, for pain every 4-6 hours.  Do not fill until 07/20/2015 03/05/16   Volanda Napoleon, MD  oxymorphone (OPANA ER) 20 MG 12 hr tablet Take 1 tablet (20 mg total) by mouth every 8 (eight) hours. Do not fill until 07/20/2015 03/05/16   Volanda Napoleon, MD   No Known Allergies  Social History  Substance Use Topics  . Smoking status: Former Smoker    Years: 32.00    Types: Cigarettes    Start date: 07/14/1979  .  Smokeless tobacco: Never Used     Comment: "no nicotene, but borrowed ecig from son so may have still using vapor cigarettes  . Alcohol use 0.0 oz/week     Comment: rare- glass of wine    History reviewed. No pertinent family history.   Review of Systems  Positive ROS: neg  All other systems have been reviewed and were otherwise negative with the exception of those mentioned in the HPI and as above.  Objective: Vital signs in last 24 hours:    General Appearance: Alert, cooperative, no distress, appears stated age Head: Normocephalic, without obvious abnormality, atraumatic Eyes: PERRL, conjunctiva/corneas clear, EOM's intact    Neck: Supple, symmetrical, trachea midline Back:  Symmetric, no curvature, ROM normal, no CVA tenderness Lungs:  respirations unlabored Heart: Regular rate and rhythm Abdomen: Soft, non-tender Extremities: Extremities normal, atraumatic, no cyanosis or edema Pulses: 2+ and symmetric all extremities Skin: Skin color, texture, turgor normal, no rashes or lesions  NEUROLOGIC:   Mental status: Alert and oriented x4,  no aphasia, good attention span, fund of knowledge, and memory Motor Exam - grossly normal Sensory Exam - grossly normal Reflexes: 1+ Coordination - grossly normal Gait - grossly normal Balance - grossly normal Cranial Nerves: I: smell Not tested  II: visual acuity  OS: nl    OD: nl  II: visual fields Full to confrontation  II: pupils Equal, round, reactive to light  III,VII: ptosis None  III,IV,VI: extraocular muscles  Full ROM  V: mastication Normal  V: facial light touch sensation  Normal  V,VII: corneal reflex  Present  VII: facial muscle function - upper  Normal  VII: facial muscle function - lower Normal  VIII: hearing Not tested  IX: soft palate elevation  Normal  IX,X: gag reflex Present  XI: trapezius strength  5/5  XI: sternocleidomastoid strength 5/5  XI: neck flexion strength  5/5  XII: tongue strength  Normal    Data Review Lab Results  Component Value Date   WBC 7.2 02/20/2016   HGB 15.4 02/20/2016   HCT 45.5 02/20/2016   MCV 85 02/20/2016   PLT 162 02/20/2016   Lab Results  Component Value Date   NA 139 02/20/2016   K 3.8 02/20/2016   CL 97 (L) 05/04/2015   CO2 27 02/20/2016   BUN 19.0 02/20/2016   CREATININE 1.1 02/20/2016   GLUCOSE 101 02/20/2016   No results found for: INR, PROTIME  Assessment/Plan: Patient admitted for PLIF L4-5, L5-S1. Patient has failed a reasonable attempt at conservative therapy.  I explained the condition and procedure to the patient and answered any questions.  Patient wishes to proceed with procedure as planned. Understands risks/ benefits and typical  outcomes of procedure.   Allex Lapoint S 03/07/2016 6:53 AM

## 2016-03-08 ENCOUNTER — Encounter (HOSPITAL_COMMUNITY): Payer: Self-pay | Admitting: Neurological Surgery

## 2016-03-08 MED ORDER — METHOCARBAMOL 500 MG PO TABS: 500.0000 mg | ORAL_TABLET | Freq: Four times a day (QID) | ORAL | 1 refills | Status: DC | PRN

## 2016-03-08 MED ORDER — MORPHINE SULFATE 30 MG PO TABS: ORAL_TABLET | ORAL | 0 refills | Status: DC

## 2016-03-08 NOTE — Discharge Summary (Signed)
Physician Discharge Summary  Patient ID: Jacob Bradford MRN: [id number] DOB/AGE: 1960-07-02 55 y.o.  Admit date: 03/07/2016 Discharge date: 03/08/2016  Admission Diagnoses: stenosis/ HNP/ DDD   Discharge Diagnoses: same   Discharged Condition: good  Hospital Course: The patient was admitted on 03/07/2016 and taken to the operating room where the patient underwent PLIF. The patient tolerated the procedure well and was taken to the recovery room and then to the floor in stable condition. The hospital course was routine. There were no complications. The wound remained clean dry and intact. Pt had appropriate back soreness. No complaints of leg pain or new N/T/W. The patient remained afebrile with stable vital signs, and tolerated a regular diet. The patient continued to increase activities, and pain was well controlled with oral pain medications.   Consults: None  Significant Diagnostic Studies:  Results for orders placed or performed during the hospital encounter of 03/07/16  Surgical pcr screen  Result Value Ref Range   MRSA, PCR NEGATIVE NEGATIVE   Staphylococcus aureus NEGATIVE NEGATIVE  Basic metabolic panel  Result Value Ref Range   Sodium 139 135 - 145 mmol/L   Potassium 3.9 3.5 - 5.1 mmol/L   Chloride 105 101 - 111 mmol/L   CO2 25 22 - 32 mmol/L   Glucose, Bld 117 (H) 65 - 99 mg/dL   BUN 11 6 - 20 mg/dL   Creatinine, Ser 0.96 0.61 - 1.24 mg/dL   Calcium 9.0 8.9 - 10.3 mg/dL   GFR calc non Af Amer >60 >60 mL/min   GFR calc Af Amer >60 >60 mL/min   Anion gap 9 5 - 15  CBC WITH DIFFERENTIAL  Result Value Ref Range   WBC 6.6 4.0 - 10.5 K/uL   RBC 5.37 4.22 - 5.81 MIL/uL   Hemoglobin 14.8 13.0 - 17.0 g/dL   HCT 46.1 39.0 - 52.0 %   MCV 85.8 78.0 - 100.0 fL   MCH 27.6 26.0 - 34.0 pg   MCHC 32.1 30.0 - 36.0 g/dL   RDW 13.7 11.5 - 15.5 %   Platelets 240 150 - 400 K/uL   Neutrophils Relative % 70 %   Neutro Abs 4.6 1.7 - 7.7 K/uL   Lymphocytes Relative 18 %   Lymphs  Abs 1.2 0.7 - 4.0 K/uL   Monocytes Relative 9 %   Monocytes Absolute 0.6 0.1 - 1.0 K/uL   Eosinophils Relative 2 %   Eosinophils Absolute 0.2 0.0 - 0.7 K/uL   Basophils Relative 1 %   Basophils Absolute 0.1 0.0 - 0.1 K/uL  Protime-INR  Result Value Ref Range   Prothrombin Time 12.2 11.4 - 15.2 seconds   INR 0.91   Type and screen All Cardiac and thoracic surgeries, spinal fusions, myomectomies, craniotomies, colon & liver resections, total joint revisions, same day c-section with placenta previa or accreta.  Result Value Ref Range   ABO/RH(D) O POS    Antibody Screen NEG    Sample Expiration 03/10/2016   ABO/Rh  Result Value Ref Range   ABO/RH(D) O POS     Dg Lumbar Spine 2-3 Views  Result Date: 03/07/2016 CLINICAL DATA:  Posterior fusion L5-S1, L4-5 EXAM: LUMBAR SPINE - 2-3 VIEW COMPARISON:  None. FINDINGS: Three intraoperative spot images demonstrate changes of posterior fusion from L4-S1. Normal alignment. No hardware complicating feature. IMPRESSION: Posterior fusion L4-S1.  No visible complicating feature. Electronically Signed   By: Rolm Baptise M.D.   On: 03/07/2016 13:21   Dg C-arm 61-120 Min  Result Date: 03/07/2016 CLINICAL DATA:  Lumbar fusion . EXAM: DG C-ARM 61-120 MIN COMPARISON:  MRI 11/16/2015 . FINDINGS: L4 through S1 posterior and interbody fusion. Hardware intact. No acute bony abnormality identified. Lumbar vertebra numbered as per prior MRI. IMPRESSION: L4 through S1 posterior interbody fusion. Hardware intact. No acute abnormality identified . Electronically Signed   By: Marcello Moores  Register   On: 03/07/2016 13:22    Antibiotics:  Anti-infectives    Start     Dose/Rate Route Frequency Ordered Stop   03/07/16 2100  ceFAZolin (ANCEF) IVPB 1 g/50 mL premix     1 g 100 mL/hr over 30 Minutes Intravenous Every 8 hours 03/07/16 1545 03/08/16 0443   03/07/16 1246  vancomycin (VANCOCIN) powder  Status:  Discontinued       As needed 03/07/16 1246 03/07/16 1305   03/07/16  1000  bacitracin 50,000 Units in sodium chloride irrigation 0.9 % 500 mL irrigation  Status:  Discontinued       As needed 03/07/16 1000 03/07/16 1305   03/07/16 0549  ceFAZolin (ANCEF) IVPB 2g/100 mL premix     2 g 200 mL/hr over 30 Minutes Intravenous On call to O.R. 03/07/16 0549 03/07/16 1245      Discharge Exam: Blood pressure 133/65, pulse 89, temperature 98.5 F (36.9 C), temperature source Oral, resp. rate 18, height 6' (1.829 m), weight 110.7 kg (244 lb), SpO2 98 %. Neurologic: Grossly normal Dressing dry  Discharge Medications:     Medication List    STOP taking these medications   celecoxib 200 MG capsule Commonly known as:  CELEBREX   oxymorphone 20 MG 12 hr tablet Commonly known as:  OPANA ER     TAKE these medications   acetaminophen 500 MG tablet Commonly known as:  TYLENOL Take 1,000 mg by mouth every 6 (six) hours as needed for moderate pain.   ALPRAZolam 1 MG tablet Commonly known as:  XANAX Take 1 tablet (1 mg total) by mouth 3 (three) times daily as needed for anxiety. What changed:  how much to take   aspirin 81 MG tablet Take 1 tablet (81 mg total) by mouth daily.   calcium-vitamin D 500-200 MG-UNIT tablet Take 1 tablet by mouth every evening.   Fish Oil 1000 MG Caps Take 1,000 mg by mouth at bedtime.   GOODY HEADACHE PO Take 1 packet by mouth daily as needed (headaches).   hydrochlorothiazide 25 MG tablet Commonly known as:  HYDRODIURIL Take 25 mg by mouth every evening.   methocarbamol 500 MG tablet Commonly known as:  ROBAXIN Take 1 tablet (500 mg total) by mouth every 6 (six) hours as needed for muscle spasms.   morphine 30 MG tablet Commonly known as:  MSIR Take 1-2 pills, as needed, for pain every 6 hours. What changed:  additional instructions   multivitamin-lutein Caps capsule Take 1 capsule by mouth every evening.   omeprazole 20 MG capsule Commonly known as:  PRILOSEC Take 20 mg by mouth every evening.   polyethylene  glycol packet Commonly known as:  MIRALAX / GLYCOLAX Take 17 g by mouth daily as needed for moderate constipation.   pyridOXINE 100 MG tablet Commonly known as:  VITAMIN B-6 Take 100 mg by mouth 2 (two) times daily at 8 am and 1 pm.   sildenafil 25 MG tablet Commonly known as:  VIAGRA Take 25-100 mg by mouth daily as needed for erectile dysfunction.   vitamin C 500 MG tablet Commonly known as:  ASCORBIC ACID Take  500 mg by mouth 2 (two) times daily at 8 am and 1 pm.       Disposition: home   Final Dx: PLIF L4-5, L5-S1  Discharge Instructions     Remove dressing in 72 hours    Complete by:  As directed    Call MD for:  difficulty breathing, headache or visual disturbances    Complete by:  As directed    Call MD for:  persistant nausea and vomiting    Complete by:  As directed    Call MD for:  redness, tenderness, or signs of infection (pain, swelling, redness, odor or green/yellow discharge around incision site)    Complete by:  As directed    Call MD for:  severe uncontrolled pain    Complete by:  As directed    Call MD for:  temperature >100.4    Complete by:  As directed    Diet - low sodium heart healthy    Complete by:  As directed    Discharge instructions    Complete by:  As directed    No driving, no bending or twisting, no heavy lifting, may shower   Increase activity slowly    Complete by:  As directed       Follow-up Information    Tonique Mendonca S, MD. Schedule an appointment as soon as possible for a visit in 2 week(s).   Specialty:  Neurosurgery Contact information: [address] [place] [address] [phone]            Signed: Eustace Moore 03/08/2016, 7:43 AM

## 2016-03-08 NOTE — Evaluation (Signed)
Occupational Therapy Evaluation and Discharge  Patient Details Name: Jacob Bradford MRN: [id number] DOB: 12/01/1960 Today's Date: 03/08/2016    History of Present Illness Pt is a 55 y.o. male s/p L4-S1 MAS PLIF. PMHx: Anxiety, Depression, HTN, Spinal stenosis.   Clinical Impression   Pt reports he was independent with ADL PTA and used a cane for mobility. Pt currently requires supervision for ADL and functional mobility with the exception of min assist for LB ADL. All back, safety, and ADL education completed with pt; pt with no further questions or concerns for OT at this time. Pt planning to d/c home with 24/7 supervision from his girlfriend. No further acute OT needs identified; signing off at this time. Please re-consult if needs change. Thank you for this referral.    Follow Up Recommendations  No OT follow up;Supervision/Assistance - 24 hour    Equipment Recommendations  None recommended by OT    Recommendations for Other Services PT consult     Precautions / Restrictions Precautions Precautions: Back;Fall Precaution Booklet Issued: Yes (comment) Precaution Comments: Educated pt on back precautions. Required Braces or Orthoses: Spinal Brace Spinal Brace: Lumbar corset Restrictions Weight Bearing Restrictions: No      Mobility Bed Mobility Overal bed mobility: Needs Assistance Bed Mobility: Sidelying to Sit   Sidelying to sit: Supervision       General bed mobility comments: Supervision for safety. Pt already in sidelying upon OT arrival. Educated on log roll technique. HOB flat with use of bed rails.  Transfers Overall transfer level: Needs assistance Equipment used: Straight cane Transfers: Sit to/from Stand Sit to Stand: Supervision         General transfer comment: Supervision for safety; no physical assist needed. Good technique and hand placement. Sit to stand from EOB x1, toilet x1.    Balance Overall balance assessment: No apparent balance  deficits (not formally assessed)                                          ADL Overall ADL's : Needs assistance/impaired Eating/Feeding: Modified independent;Sitting   Grooming: Supervision/safety;Standing Grooming Details (indicate cue type and reason): Educated on use of 2 cups for oral care. Upper Body Bathing: Set up;Supervision/ safety;Sitting   Lower Body Bathing: Minimal assistance;Sit to/from stand   Upper Body Dressing : Set up;Supervision/safety;Sitting   Lower Body Dressing: Minimal assistance;Sit to/from stand Lower Body Dressing Details (indicate cue type and reason): Pt reports girlfriend can assist with ADL as needed; no need for AE. Toilet Transfer: Supervision/safety;Ambulation;Comfort height toilet (Cane)     Toileting - Clothing Manipulation Details (indicate cue type and reason): Educated pt on no twisting during peri care after BM; educated on proper technique to avoid twisting. Tub/ Shower Transfer: Supervision/safety;Tub transfer;Ambulation;Grab bars Tub/Shower Transfer Details (indicate cue type and reason): Simulated tub transfer. Pt able to perform with supervision. Discussed supervision for safety upon return home and possible use of shower seat if needed. Pt reports he has access to shower chair if needed.  Functional mobility during ADLs: Supervision/safety;Cane General ADL Comments: Educated pt on maintaining back precautions during functional activities, log roll technique for bed mobility, keeping frequently used items at counter top height.     Vision Vision Assessment?: No apparent visual deficits   Perception     Praxis      Pertinent Vitals/Pain Pain Assessment: 0-10 Pain Score: 5  Pain  Location: back Pain Descriptors / Indicators: Aching;Grimacing Pain Intervention(s): Monitored during session;Repositioned;Patient requesting pain meds-RN notified     Hand Dominance     Extremity/Trunk Assessment Upper Extremity  Assessment Upper Extremity Assessment: Overall WFL for tasks assessed   Lower Extremity Assessment Lower Extremity Assessment: Defer to PT evaluation   Cervical / Trunk Assessment Cervical / Trunk Assessment: Other exceptions Cervical / Trunk Exceptions: s/p spinal sx   Communication Communication Communication: No difficulties   Cognition Arousal/Alertness: Awake/alert Behavior During Therapy: WFL for tasks assessed/performed Overall Cognitive Status: Within Functional Limits for tasks assessed                     General Comments       Exercises       Shoulder Instructions      Home Living Family/patient expects to be discharged to:: Private residence Living Arrangements: Spouse/significant other (girlfriend) Available Help at Discharge: Family;Available 24 hours/day Type of Home: Mobile home Home Access: Ramped entrance     Home Layout: One level     Bathroom Shower/Tub: Tub/shower unit Shower/tub characteristics: Architectural technologist: Handicapped height (one of each)     Home Equipment: Walker - 2 wheels;Cane - single point;Bedside commode;Shower seat;Grab bars - toilet;Grab bars - tub/shower;Hand held shower head          Prior Functioning/Environment Level of Independence: Independent with assistive device(s)        Comments: Cane for mobility PTA.        OT Problem List: Decreased strength;Decreased knowledge of precautions;Pain   OT Treatment/Interventions:      OT Goals(Current goals can be found in the care plan section) Acute Rehab OT Goals Patient Stated Goal: return home today OT Goal Formulation: All assessment and education complete, DC therapy  OT Frequency:     Barriers to D/C:            Co-evaluation              End of Session Equipment Utilized During Treatment: Other (comment) Kasandra Knudsen) Nurse Communication: Mobility status;Patient requests pain meds  Activity Tolerance: Patient tolerated treatment  well Patient left: in chair;with call bell/phone within reach   Time: 0802-0817 OT Time Calculation (min): 15 min Charges:  OT General Charges $OT Visit: 1 Procedure OT Evaluation $OT Eval Moderate Complexity: 1 Procedure G-Codes:     Binnie Kand M.S., OTR/L Pager: [phone]  03/08/2016, 8:39 AM

## 2016-03-08 NOTE — Evaluation (Signed)
Physical Therapy Evaluation Patient Details Name: Jacob Bradford MRN: [id number] DOB: 10/19/1960 Today's Date: 03/08/2016   History of Present Illness  Pt is a 55 y.o. male s/p L4-S1 MAS PLIF. PMHx: Anxiety, Depression, HTN, Spinal stenosis.  Clinical Impression  Pt close to baseline functioning and should be safe at home with girlfriend's assist  All education completed. There are no further acute PT needs.  Will sign off at this time.     Follow Up Recommendations No PT follow up    Equipment Recommendations  None recommended by PT    Recommendations for Other Services       Precautions / Restrictions Precautions Precautions: Back;Fall Precaution Booklet Issued: Yes (comment) Precaution Comments: Educated pt on back precautions. Required Braces or Orthoses: Spinal Brace Spinal Brace: Lumbar corset Restrictions Weight Bearing Restrictions: No      Mobility  Bed Mobility Overal bed mobility: Needs Assistance Bed Mobility: Rolling;Sidelying to Sit Rolling: Supervision Sidelying to sit: Supervision       General bed mobility comments: Good technique  Transfers Overall transfer level: Needs assistance Equipment used: Straight cane Transfers: Sit to/from Stand Sit to Stand: Supervision         General transfer comment: Supervision for safety; no physical assist needed. Good technique and hand placement. Sit to stand from EOB x1, toilet x1.  Ambulation/Gait Ambulation/Gait assistance: Supervision Ambulation Distance (Feet): 240 Feet Assistive device: Straight cane Gait Pattern/deviations: Step-through pattern   Gait velocity interpretation: at or above normal speed for age/gender General Gait Details: steady with cane  Stairs Stairs: Yes Stairs assistance: Supervision Stair Management: One rail Right;Alternating pattern;Forwards Number of Stairs: 4 General stair comments: safe with rail  Wheelchair Mobility    Modified Rankin (Stroke Patients  Only)       Balance Overall balance assessment: No apparent balance deficits (not formally assessed)                                           Pertinent Vitals/Pain Pain Assessment: Faces Pain Score: 5  Faces Pain Scale: Hurts little more Pain Location: back Pain Descriptors / Indicators: Aching Pain Intervention(s): Monitored during session;Repositioned    Home Living Family/patient expects to be discharged to:: Private residence Living Arrangements: Spouse/significant other Available Help at Discharge: Family;Available 24 hours/day Type of Home: Mobile home Home Access: Ramped entrance     Home Layout: One level Home Equipment: [place] - 2 wheels;Cane - single point;Bedside commode;Shower seat;Grab bars - toilet;Grab bars - tub/shower;Hand held shower head      Prior Function Level of Independence: Independent with assistive device(s)         Comments: Cane for mobility PTA.     Hand Dominance        Extremity/Trunk Assessment   Upper Extremity Assessment: Defer to OT evaluation           Lower Extremity Assessment: Overall WFL for tasks assessed (mild proximal weakness)      Cervical / Trunk Assessment: Other exceptions  Communication   Communication: No difficulties  Cognition Arousal/Alertness: Awake/alert Behavior During Therapy: WFL for tasks assessed/performed Overall Cognitive Status: Within Functional Limits for tasks assessed                      General Comments General comments (skin integrity, edema, etc.): pt educated in back care/prec, logroll/transitions, lifting restrictions, progression of activity.  Exercises     Assessment/Plan    PT Assessment Patent does not need any further PT services  PT Problem List            PT Treatment Interventions      PT Goals (Current goals can be found in the Care Plan section)  Acute Rehab PT Goals Patient Stated Goal: return home today PT Goal  Formulation: All assessment and education complete, DC therapy    Frequency     Barriers to discharge        Co-evaluation               End of Session   Activity Tolerance: Patient tolerated treatment well Patient left: Other (comment) (sitting EOB)           Time: [id number] PT Time Calculation (min) (ACUTE ONLY): 18 min   Charges:   PT Evaluation $PT Eval Low Complexity: 1 Procedure     PT G Codes:        Irisa Grimsley, Tessie Fass 03/08/2016, 10:25 AM 03/08/2016  Donnella Sham, PT [phone]  (pager)

## 2016-03-08 NOTE — Progress Notes (Signed)
Volunteer is safely transporting downstairs to meet family. Patient has all belongings.

## 2016-03-08 NOTE — Care Management Note (Signed)
Case Management Note  Patient Details  Name: IZAC FAULKENBERRY MRN: [id number] Date of Birth: 14-Dec-1960  Subjective/Objective:  Pt s/p lumbar surgery. He is from home with his girl friend.                   Action/Plan: Orders for a rolling walker. CM met with the patient and he states he has a walker. No further needs per CM.   Expected Discharge Date:                  Expected Discharge Plan:  Home/Self Care  In-House Referral:     Discharge planning Services  CM Consult  Post Acute Care Choice:  Durable Medical Equipment Choice offered to:  Patient  DME Arranged:   (refused) DME Agency:     HH Arranged:    [place] Agency:     Status of Service:  Completed, signed off  If discussed at [place] of Stay Meetings, dates discussed:    Additional Comments:  Pollie Friar, RN 03/08/2016, 10:23 AM

## 2016-03-08 NOTE — Progress Notes (Signed)
Patient A/o, no noted distress. Educated on meds, tolerated well. Pain 5/10. Educated on discharge instructions and follow up appointments. Patient has all belongings and signed all documents.

## 2016-03-16 ENCOUNTER — Encounter (HOSPITAL_COMMUNITY): Payer: Self-pay | Admitting: Neurological Surgery

## 2016-03-20 ENCOUNTER — Encounter (HOSPITAL_COMMUNITY): Payer: Self-pay | Admitting: Neurological Surgery

## 2016-04-02 ENCOUNTER — Other Ambulatory Visit: Payer: Self-pay | Admitting: *Deleted

## 2016-04-02 DIAGNOSIS — S63591S Other specified sprain of right wrist, sequela: Secondary | ICD-10-CM

## 2016-04-02 DIAGNOSIS — M47817 Spondylosis without myelopathy or radiculopathy, lumbosacral region: Secondary | ICD-10-CM

## 2016-04-02 MED ORDER — OXYMORPHONE HCL ER 20 MG PO TB12: 20.0000 mg | ORAL_TABLET | Freq: Three times a day (TID) | ORAL | 0 refills | Status: DC

## 2016-04-02 MED ORDER — MORPHINE SULFATE 30 MG PO TABS: ORAL_TABLET | ORAL | 0 refills | Status: DC

## 2016-04-23 ENCOUNTER — Other Ambulatory Visit (HOSPITAL_BASED_OUTPATIENT_CLINIC_OR_DEPARTMENT_OTHER): Payer: Managed Care, Other (non HMO)

## 2016-04-23 ENCOUNTER — Ambulatory Visit (HOSPITAL_BASED_OUTPATIENT_CLINIC_OR_DEPARTMENT_OTHER): Payer: Managed Care, Other (non HMO) | Admitting: Hematology & Oncology

## 2016-04-23 VITALS — BP 166/81 | HR 94 | Temp 97.0°F | Resp 20 | Wt 246.4 lb

## 2016-04-23 DIAGNOSIS — M549 Dorsalgia, unspecified: Secondary | ICD-10-CM

## 2016-04-23 DIAGNOSIS — D45 Polycythemia vera: Secondary | ICD-10-CM

## 2016-04-23 DIAGNOSIS — M5136 Other intervertebral disc degeneration, lumbar region: Secondary | ICD-10-CM

## 2016-04-23 DIAGNOSIS — Z720 Tobacco use: Secondary | ICD-10-CM

## 2016-04-23 DIAGNOSIS — M47817 Spondylosis without myelopathy or radiculopathy, lumbosacral region: Secondary | ICD-10-CM

## 2016-04-23 DIAGNOSIS — S63591S Other specified sprain of right wrist, sequela: Secondary | ICD-10-CM

## 2016-04-23 LAB — COMPREHENSIVE METABOLIC PANEL
ALBUMIN: 3.5 g/dL (ref 3.5–5.0)
ALK PHOS: 108 U/L (ref 40–150)
ALT: 21 U/L (ref 0–55)
AST: 15 U/L (ref 5–34)
Anion Gap: 9 mEq/L (ref 3–11)
BILIRUBIN TOTAL: 0.88 mg/dL (ref 0.20–1.20)
BUN: 13.4 mg/dL (ref 7.0–26.0)
CO2: 26 meq/L (ref 22–29)
CREATININE: 1.1 mg/dL (ref 0.7–1.3)
Calcium: 9 mg/dL (ref 8.4–10.4)
Chloride: 104 mEq/L (ref 98–109)
EGFR: 76 mL/min/{1.73_m2} — ABNORMAL LOW (ref >90)
GLUCOSE: 205 mg/dL — AB (ref 70–140)
Potassium: 4 mEq/L (ref 3.5–5.1)
SODIUM: 139 meq/L (ref 136–145)
TOTAL PROTEIN: 7.1 g/dL (ref 6.4–8.3)

## 2016-04-23 LAB — CBC WITH DIFFERENTIAL (CANCER CENTER ONLY)
BASO#: 0 10*3/uL (ref 0.0–0.2)
BASO%: 0.4 % (ref 0.0–2.0)
EOS ABS: 0.1 10*3/uL (ref 0.0–0.5)
EOS%: 1.6 % (ref 0.0–7.0)
HCT: 42.1 % (ref 38.7–49.9)
HEMOGLOBIN: 13.9 g/dL (ref 13.0–17.1)
LYMPH#: 1 10*3/uL (ref 0.9–3.3)
LYMPH%: 15.5 % (ref 14.0–48.0)
MCH: 28.3 pg (ref 28.0–33.4)
MCHC: 33 g/dL (ref 32.0–35.9)
MCV: 86 fL (ref 82–98)
MONO#: 0.3 10*3/uL (ref 0.1–0.9)
MONO%: 4.3 % (ref 0.0–13.0)
NEUT%: 78.2 % (ref 40.0–80.0)
NEUTROS ABS: 5.2 10*3/uL (ref 1.5–6.5)
Platelets: 239 10*3/uL (ref 145–400)
RBC: 4.92 10*6/uL (ref 4.20–5.70)
RDW: 14.5 % (ref 11.1–15.7)
WBC: 6.7 10*3/uL (ref 4.0–10.0)

## 2016-04-23 MED ORDER — MORPHINE SULFATE 30 MG PO TABS: ORAL_TABLET | ORAL | 0 refills | Status: DC

## 2016-04-23 MED ORDER — OXYMORPHONE HCL ER 20 MG PO TB12: ORAL_TABLET | ORAL | 0 refills | Status: DC
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
## 2016-04-24 ENCOUNTER — Telehealth: Payer: Self-pay | Admitting: *Deleted

## 2016-04-24 LAB — IRON AND TIBC
%SAT: 27 % (ref 20–55)
IRON: 134 ug/dL (ref 42–163)
TIBC: 489 ug/dL — ABNORMAL HIGH (ref 202–409)
UIBC: 355 ug/dL (ref 117–376)

## 2016-04-24 LAB — FERRITIN: Ferritin: 14 ng/ml — ABNORMAL LOW (ref 22–316)

## 2016-04-24 NOTE — Telephone Encounter (Addendum)
Patient aware of results. He will speak to his PCP  ----- Message from Volanda Napoleon, MD sent at 04/23/2016  6:10 PM EST ----- Call - your blood sugar is way too high!!!  Be careful or you will develop diabetes!!!  Watch your sugar intake!!  pete

## 2016-05-08 ENCOUNTER — Other Ambulatory Visit: Payer: Self-pay | Admitting: *Deleted

## 2016-05-08 DIAGNOSIS — S63591S Other specified sprain of right wrist, sequela: Secondary | ICD-10-CM

## 2016-05-08 DIAGNOSIS — D45 Polycythemia vera: Secondary | ICD-10-CM

## 2016-05-08 DIAGNOSIS — M47817 Spondylosis without myelopathy or radiculopathy, lumbosacral region: Secondary | ICD-10-CM

## 2016-05-08 MED ORDER — MORPHINE SULFATE 30 MG PO TABS: ORAL_TABLET | ORAL | 0 refills | Status: DC

## 2016-06-01 ENCOUNTER — Other Ambulatory Visit: Payer: Self-pay | Admitting: *Deleted

## 2016-06-01 DIAGNOSIS — S63591S Other specified sprain of right wrist, sequela: Secondary | ICD-10-CM

## 2016-06-01 DIAGNOSIS — D45 Polycythemia vera: Secondary | ICD-10-CM

## 2016-06-01 DIAGNOSIS — M47817 Spondylosis without myelopathy or radiculopathy, lumbosacral region: Secondary | ICD-10-CM

## 2016-06-01 MED ORDER — MORPHINE SULFATE 30 MG PO TABS: ORAL_TABLET | ORAL | 0 refills | Status: DC

## 2016-06-01 MED ORDER — OXYMORPHONE HCL ER 20 MG PO TB12: ORAL_TABLET | ORAL | 0 refills | Status: DC

## 2016-06-27 ENCOUNTER — Other Ambulatory Visit (HOSPITAL_BASED_OUTPATIENT_CLINIC_OR_DEPARTMENT_OTHER): Payer: Managed Care, Other (non HMO)

## 2016-06-27 ENCOUNTER — Ambulatory Visit (HOSPITAL_BASED_OUTPATIENT_CLINIC_OR_DEPARTMENT_OTHER): Payer: Managed Care, Other (non HMO)

## 2016-06-27 ENCOUNTER — Encounter: Payer: Self-pay | Admitting: Hematology & Oncology

## 2016-06-27 ENCOUNTER — Ambulatory Visit (HOSPITAL_BASED_OUTPATIENT_CLINIC_OR_DEPARTMENT_OTHER): Payer: Managed Care, Other (non HMO) | Admitting: Hematology & Oncology

## 2016-06-27 VITALS — BP 138/98 | HR 79 | Resp 16

## 2016-06-27 DIAGNOSIS — M47817 Spondylosis without myelopathy or radiculopathy, lumbosacral region: Secondary | ICD-10-CM

## 2016-06-27 DIAGNOSIS — D45 Polycythemia vera: Secondary | ICD-10-CM | POA: Diagnosis not present

## 2016-06-27 DIAGNOSIS — S63591S Other specified sprain of right wrist, sequela: Secondary | ICD-10-CM

## 2016-06-27 LAB — CBC WITH DIFFERENTIAL (CANCER CENTER ONLY)
BASO#: 0.1 10*3/uL (ref 0.0–0.2)
BASO%: 0.7 % (ref 0.0–2.0)
EOS ABS: 0.2 10*3/uL (ref 0.0–0.5)
EOS%: 3.1 % (ref 0.0–7.0)
HCT: 44.7 % (ref 38.7–49.9)
HGB: 14.8 g/dL (ref 13.0–17.1)
LYMPH#: 1.3 10*3/uL (ref 0.9–3.3)
LYMPH%: 19.4 % (ref 14.0–48.0)
MCH: 28 pg (ref 28.0–33.4)
MCHC: 33.1 g/dL (ref 32.0–35.9)
MCV: 85 fL (ref 82–98)
MONO#: 0.5 10*3/uL (ref 0.1–0.9)
MONO%: 6.8 % (ref 0.0–13.0)
NEUT#: 4.8 10*3/uL (ref 1.5–6.5)
NEUT%: 70 % (ref 40.0–80.0)
PLATELETS: 210 10*3/uL (ref 145–400)
RBC: 5.29 10*6/uL (ref 4.20–5.70)
RDW: 13.8 % (ref 11.1–15.7)
WBC: 6.8 10*3/uL (ref 4.0–10.0)

## 2016-06-27 LAB — CMP (CANCER CENTER ONLY)
ALT(SGPT): 34 U/L (ref 10–47)
AST: 22 U/L (ref 11–38)
Albumin: 3.5 g/dL (ref 3.3–5.5)
Alkaline Phosphatase: 99 U/L — ABNORMAL HIGH (ref 26–84)
BUN: 17 mg/dL (ref 7–22)
CHLORIDE: 103 meq/L (ref 98–108)
CO2: 29 meq/L (ref 18–33)
CREATININE: 1.2 mg/dL (ref 0.6–1.2)
Calcium: 9.4 mg/dL (ref 8.0–10.3)
GLUCOSE: 155 mg/dL — AB (ref 73–118)
POTASSIUM: 4.3 meq/L (ref 3.3–4.7)
SODIUM: 141 meq/L (ref 128–145)
Total Bilirubin: 0.6 mg/dl (ref 0.20–1.60)
Total Protein: 7 g/dL (ref 6.4–8.1)

## 2016-06-27 MED ORDER — MORPHINE SULFATE 30 MG PO TABS: ORAL_TABLET | ORAL | 0 refills | Status: DC

## 2016-06-27 MED ORDER — OXYMORPHONE HCL ER 20 MG PO TB12: ORAL_TABLET | ORAL | 0 refills | Status: DC

## 2016-06-27 NOTE — Progress Notes (Signed)
Jacob Bradford presents today for phlebotomy per MD orders. Phlebotomy procedure started at 1345 and ended at 1355. 560 grams removed via 16 gauge needle to right AC. Patient observed for 30 minutes after procedure without any incident. Patient tolerated procedure well. IV needle removed intact.
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
## 2016-06-27 NOTE — Patient Instructions (Signed)
Therapeutic Phlebotomy, Care After  Refer to this sheet in the next few weeks. These instructions provide you with information about caring for yourself after your procedure. Your health care provider may also give you more specific instructions. Your treatment has been planned according to current medical practices, but problems sometimes occur. Call your health care provider if you have any problems or questions after your procedure.  WHAT TO EXPECT AFTER THE PROCEDURE  After your procedure, it is common to have:   Light-headedness or dizziness. You may feel faint.   Nausea.   Tiredness.  HOME CARE INSTRUCTIONS  Activities   Return to your normal activities as directed by your health care provider. Most people can go back to their normal activities right away.   Avoid strenuous physical activity and heavy lifting or pulling for about 5 hours after the procedure. Do not lift anything that is heavier than 10 lb (4.5 kg).   Athletes should avoid strenuous exercise for at least 12 hours.   Change positions slowly for the remainder of the day. This will help to prevent light-headedness or fainting.   If you feel light-headed, lie down until the feeling goes away.  Eating and Drinking   Be sure to eat well-balanced meals for the next 24 hours.   Drink enough fluid to keep your urine clear or pale yellow.   Avoid drinking alcohol on the day that you had the procedure.  Care of the Needle Insertion Site   Keep your bandage dry. You can remove the bandage after about 5 hours or as directed by your health care provider.   If you have bleeding from the needle insertion site, elevate your arm and press firmly on the site until the bleeding stops.   If you have bruising at the site, apply ice to the area:   Put ice in a plastic bag.   Place a towel between your skin and the bag.   Leave the ice on for 20 minutes, 2-3 times a day for the first 24 hours.   If the swelling does not go away after 24 hours, apply  a warm, moist washcloth to the area for 20 minutes, 2-3 times a day.  General Instructions   Avoid smoking for at least 30 minutes after the procedure.   Keep all follow-up visits as directed by your health care provider. It is important to continue with further therapeutic phlebotomy treatments as directed.  SEEK MEDICAL CARE IF:   You have redness, swelling, or pain at the needle insertion site.   You have fluid, blood, or pus coming from the needle insertion site.   You feel light-headed, dizzy, or nauseated, and the feeling does not go away.   You notice new bruising at the needle insertion site.   You feel weaker than normal.   You have a fever or chills.  SEEK IMMEDIATE MEDICAL CARE IF:   You have severe nausea or vomiting.   You have chest pain.   You have trouble breathing.    This information is not intended to replace advice given to you by your health care provider. Make sure you discuss any questions you have with your health care provider.    Document Released: 10/23/2010 Document Revised: 10/05/2014 Document Reviewed: 05/17/2014  Elsevier Interactive Patient Education 2016 [employer]

## 2016-06-28 LAB — IRON AND TIBC
%SAT: 11 % — AB (ref 20–55)
IRON: 52 ug/dL (ref 42–163)
TIBC: 461 ug/dL — ABNORMAL HIGH (ref 202–409)
UIBC: 409 ug/dL — ABNORMAL HIGH (ref 117–376)

## 2016-06-28 LAB — FERRITIN: FERRITIN: 13 ng/mL — AB (ref 22–316)

## 2016-07-30 ENCOUNTER — Other Ambulatory Visit: Payer: Self-pay | Admitting: *Deleted

## 2016-07-30 DIAGNOSIS — S63591S Other specified sprain of right wrist, sequela: Secondary | ICD-10-CM

## 2016-07-30 DIAGNOSIS — D45 Polycythemia vera: Secondary | ICD-10-CM

## 2016-07-30 DIAGNOSIS — M47817 Spondylosis without myelopathy or radiculopathy, lumbosacral region: Secondary | ICD-10-CM

## 2016-07-30 MED ORDER — MORPHINE SULFATE 30 MG PO TABS: ORAL_TABLET | ORAL | 0 refills | Status: DC

## 2016-07-30 MED ORDER — OXYMORPHONE HCL ER 20 MG PO TB12: ORAL_TABLET | ORAL | 0 refills | Status: DC

## 2016-08-27 ENCOUNTER — Other Ambulatory Visit: Payer: Self-pay | Admitting: *Deleted

## 2016-08-27 DIAGNOSIS — D45 Polycythemia vera: Secondary | ICD-10-CM

## 2016-08-27 DIAGNOSIS — M47817 Spondylosis without myelopathy or radiculopathy, lumbosacral region: Secondary | ICD-10-CM

## 2016-08-27 DIAGNOSIS — S63591S Other specified sprain of right wrist, sequela: Secondary | ICD-10-CM

## 2016-08-27 MED ORDER — MORPHINE SULFATE 30 MG PO TABS: ORAL_TABLET | ORAL | 0 refills | Status: DC

## 2016-08-27 MED ORDER — OXYMORPHONE HCL ER 20 MG PO TB12: ORAL_TABLET | ORAL | 0 refills | Status: DC

## 2016-08-29 ENCOUNTER — Ambulatory Visit (HOSPITAL_BASED_OUTPATIENT_CLINIC_OR_DEPARTMENT_OTHER): Payer: Managed Care, Other (non HMO)

## 2016-08-29 ENCOUNTER — Ambulatory Visit (HOSPITAL_BASED_OUTPATIENT_CLINIC_OR_DEPARTMENT_OTHER): Payer: Managed Care, Other (non HMO) | Admitting: Family

## 2016-08-29 ENCOUNTER — Other Ambulatory Visit (HOSPITAL_BASED_OUTPATIENT_CLINIC_OR_DEPARTMENT_OTHER): Payer: Managed Care, Other (non HMO)

## 2016-08-29 VITALS — BP 151/78 | HR 100 | Resp 18

## 2016-08-29 VITALS — BP 115/78 | HR 97 | Temp 98.2°F | Resp 17 | Wt 253.0 lb

## 2016-08-29 DIAGNOSIS — M47817 Spondylosis without myelopathy or radiculopathy, lumbosacral region: Secondary | ICD-10-CM

## 2016-08-29 DIAGNOSIS — D45 Polycythemia vera: Secondary | ICD-10-CM

## 2016-08-29 DIAGNOSIS — S63591S Other specified sprain of right wrist, sequela: Secondary | ICD-10-CM

## 2016-08-29 DIAGNOSIS — M5136 Other intervertebral disc degeneration, lumbar region: Secondary | ICD-10-CM

## 2016-08-29 LAB — CBC WITH DIFFERENTIAL (CANCER CENTER ONLY)
BASO#: 0.1 10*3/uL (ref 0.0–0.2)
BASO%: 0.9 % (ref 0.0–2.0)
EOS ABS: 0.1 10*3/uL (ref 0.0–0.5)
EOS%: 1.4 % (ref 0.0–7.0)
HCT: 45.3 % (ref 38.7–49.9)
HGB: 14.8 g/dL (ref 13.0–17.1)
LYMPH#: 1.3 10*3/uL (ref 0.9–3.3)
LYMPH%: 19.7 % (ref 14.0–48.0)
MCH: 26.6 pg — AB (ref 28.0–33.4)
MCHC: 32.7 g/dL (ref 32.0–35.9)
MCV: 82 fL (ref 82–98)
MONO#: 0.6 10*3/uL (ref 0.1–0.9)
MONO%: 9.4 % (ref 0.0–13.0)
NEUT#: 4.4 10*3/uL (ref 1.5–6.5)
NEUT%: 68.6 % (ref 40.0–80.0)
PLATELETS: 248 10*3/uL (ref 145–400)
RBC: 5.56 10*6/uL (ref 4.20–5.70)
RDW: 14.6 % (ref 11.1–15.7)
WBC: 6.4 10*3/uL (ref 4.0–10.0)

## 2016-08-29 NOTE — Patient Instructions (Signed)
     Therapeutic Phlebotomy, Care After Refer to this sheet in the next few weeks. These instructions provide you with information about caring for yourself after your procedure. Your health care provider may also give you more specific instructions. Your treatment has been planned according to current medical practices, but problems sometimes occur. Call your health care provider if you have any problems or questions after your procedure. What can I expect after the procedure? After the procedure, it is common to have:  Light-headedness or dizziness. You may feel faint.  Nausea.  Tiredness. Follow these instructions at home: Activity  Return to your normal activities as directed by your health care provider. Most people can go back to their normal activities right away.  Avoid strenuous physical activity and heavy lifting or pulling for about 5 hours after the procedure. Do not lift anything that is heavier than 10 lb (4.5 kg).  Athletes should avoid strenuous exercise for at least 12 hours.  Change positions slowly for the remainder of the day. This will help to prevent light-headedness or fainting.  If you feel light-headed, lie down until the feeling goes away. Eating and drinking  Be sure to eat well-balanced meals for the next 24 hours.  Drink enough fluid to keep your urine clear or pale yellow.  Avoid drinking alcohol on the day that you had the procedure. Care of the Needle Insertion Site  Keep your bandage dry. You can remove the bandage after about 5 hours or as directed by your health care provider.  If you have bleeding from the needle insertion site, elevate your arm and press firmly on the site until the bleeding stops.  If you have bruising at the site, apply ice to the area:  Put ice in a plastic bag.  Place a towel between your skin and the bag.  Leave the ice on for 20 minutes, 2-3 times a day for the first 24 hours.  If the swelling does not go away  after 24 hours, apply a warm, moist washcloth to the area for 20 minutes, 2-3 times a day. General instructions  Avoid smoking for at least 30 minutes after the procedure.  Keep all follow-up visits as directed by your health care provider. It is important to continue with further therapeutic phlebotomy treatments as directed. Contact a health care provider if:  You have redness, swelling, or pain at the needle insertion site.  You have fluid, blood, or pus coming from the needle insertion site.  You feel light-headed, dizzy, or nauseated, and the feeling does not go away.  You notice new bruising at the needle insertion site.  You feel weaker than normal.  You have a fever or chills. Get help right away if:  You have severe nausea or vomiting.  You have chest pain.  You have trouble breathing. This information is not intended to replace advice given to you by your health care provider. Make sure you discuss any questions you have with your health care provider. Document Released: 10/23/2010 Document Revised: 01/21/2016 Document Reviewed: 05/17/2014 Elsevier Interactive Patient Education  2017 [employer] Inc[employer]  

## 2016-08-29 NOTE — Progress Notes (Signed)
Hematology and Oncology Follow Up Visit  Jacob Bradford [id number] 19-Mar-1961 56 y.o. 08/29/2016   Principle Diagnosis:  Polycythemia vera - JAK2 negative Chronic low back pain secondary to degenerative disc disease - status post spinal fusion at L4-5 and L5-S1 Traumatic ligament damage to the right thumb and right knee  Current Therapy:   Phlebotomy to maintain hematocrit below 45% Aspirin 81 mg by mouth daily - taking off and on    Interim History:  Jacob Bradford is here today for a follow-up. He is doing well but still having some soreness in his back. He has been back at work full time for 3 weeks and they are asking him to lift 80-90 lb bags and he is unable to do this. He has an appointment with his PCP to fill out paper work requesting a weight limit.  He has a headache that comes and goes. Some SOB with over exertion is unchanged.  Hct today is 45.3. He has not been taking his aspirin regularly but states that he will.  No fever, chills, night sweats, n/v, cough, rash, dizziness, headches,  blurred vision, SOB, chest pain, palpitations, abdominal pain or changes in bowel or bladder habits.  His appetite is good and he is staying hydrated. He has stopped smoking and only uses his e-cigarette on occasion. He is simply using a toothpick most of the time now.  The neuropathy in his feet is unchanged. No swelling in his extremities.   Medications:  Allergies as of 08/29/2016   No Known Allergies     Medication List       Accurate as of 08/29/16 12:51 PM. Always use your most recent med list.          acetaminophen 500 MG tablet Commonly known as:  TYLENOL Take 1,000 mg by mouth every 6 (six) hours as needed for moderate pain.   ALPRAZolam 1 MG tablet Commonly known as:  XANAX Take 1 tablet (1 mg total) by mouth 3 (three) times daily as needed for anxiety.   aspirin 81 MG tablet Take 1 tablet (81 mg total) by mouth daily.   benazepril 20 MG tablet Commonly known as:   LOTENSIN Take 20 mg by mouth.   calcium-vitamin D 500-200 MG-UNIT tablet Take 1 tablet by mouth every evening.   Fish Oil 1000 MG Caps Take 1,000 mg by mouth at bedtime.   GOODY HEADACHE PO Take 1 packet by mouth daily as needed (headaches).   hydrochlorothiazide 25 MG tablet Commonly known as:  HYDRODIURIL Take 25 mg by mouth every evening.   methocarbamol 500 MG tablet Commonly known as:  ROBAXIN Take 1 tablet (500 mg total) by mouth every 6 (six) hours as needed for muscle spasms.   morphine 30 MG tablet Commonly known as:  MSIR Take 1-2 pills, as needed, for pain every 6 hours.   multivitamin-lutein Caps capsule Take 1 capsule by mouth every evening.   omeprazole 20 MG capsule Commonly known as:  PRILOSEC Take 20 mg by mouth every evening.   oxymorphone 20 MG 12 hr tablet Commonly known as:  OPANA ER Do not fill until 05/08/2016   polyethylene glycol packet Commonly known as:  MIRALAX / GLYCOLAX Take 17 g by mouth daily as needed for moderate constipation.   pyridOXINE 100 MG tablet Commonly known as:  VITAMIN B-6 Take 100 mg by mouth 2 (two) times daily at 8 am and 1 pm.   sildenafil 25 MG tablet Commonly known as:  VIAGRA Take  25-100 mg by mouth daily as needed for erectile dysfunction.   vitamin C 500 MG tablet Commonly known as:  ASCORBIC ACID Take 500 mg by mouth 2 (two) times daily at 8 am and 1 pm.       Allergies: No Known Allergies  Past Medical History, Surgical history, Social history, and Family History were reviewed and updated.  Review of Systems: All other 10 point review of systems is negative.   Physical Exam:  weight is 253 lb (114.8 kg). His oral temperature is 98.2 F (36.8 C). His blood pressure is 115/78 and his pulse is 97. His respiration is 17 and oxygen saturation is 99%.   Wt Readings from Last 3 Encounters:  08/29/16 253 lb (114.8 kg)  06/27/16 249 lb (112.9 kg)  04/23/16 246 lb 6.4 oz (111.8 kg)    Ocular: Sclerae  unicteric, pupils equal, round and reactive to light Ear-nose-throat: Oropharynx clear, dentition fair Lymphatic: No cervical supraclavicular or axillary adenopathy Lungs no rales or rhonchi, good excursion bilaterally Heart regular rate and rhythm, no murmur appreciated Abd soft, nontender, positive bowel sounds, no liver or spleen tip palpated on exam, no fluid wave MSK no focal spinal tenderness, no joint edema Neuro: non-focal, well-oriented, appropriate affect Breasts: Deferred  Lab Results  Component Value Date   WBC 6.4 08/29/2016   HGB 14.8 08/29/2016   HCT 45.3 08/29/2016   MCV 82 08/29/2016   PLT 248 08/29/2016   Lab Results  Component Value Date   FERRITIN 13 (L) 06/27/2016   IRON 52 06/27/2016   TIBC 461 (H) 06/27/2016   UIBC 409 (H) 06/27/2016   IRONPCTSAT 11 (L) 06/27/2016   Lab Results  Component Value Date   RETICCTPCT 1.4 02/22/2011   RBC 5.56 08/29/2016   RETICCTABS 77.0 02/22/2011   No results found for: KPAFRELGTCHN, LAMBDASER, KAPLAMBRATIO No results found for: IGGSERUM, IGA, IGMSERUM No results found for: Odetta Pink, SPEI   Chemistry      Component Value Date/Time   NA 141 06/27/2016 1156   NA 139 04/23/2016 1310   K 4.3 06/27/2016 1156   K 4.0 04/23/2016 1310   CL 103 06/27/2016 1156   CO2 29 06/27/2016 1156   CO2 26 04/23/2016 1310   BUN 17 06/27/2016 1156   BUN 13.4 04/23/2016 1310   CREATININE 1.2 06/27/2016 1156   CREATININE 1.1 04/23/2016 1310      Component Value Date/Time   CALCIUM 9.4 06/27/2016 1156   CALCIUM 9.0 04/23/2016 1310   ALKPHOS 99 (H) 06/27/2016 1156   ALKPHOS 108 04/23/2016 1310   AST 22 06/27/2016 1156   AST 15 04/23/2016 1310   ALT 34 06/27/2016 1156   ALT 21 04/23/2016 1310   BILITOT 0.60 06/27/2016 1156   BILITOT 0.88 04/23/2016 1310     Impression and Plan: Jacob Bradford is 56 year old gentleman with polycythemia - JAK2 negative.  He is having some  occasional fatigue, SOB with over exertion and headaches that come and go.  Hct is 45.3 so we will do a phlebotomy today.  He promises to restart his 1 baby aspirin daily.  We will plan to see him back in 2 months for repeat labs and follow-up. He knows to call with any questions or concerns. We can certainly see him sooner if need be.   Eliezer Bottom, NP 3/28/201812:51 PM

## 2016-08-29 NOTE — Progress Notes (Signed)
Jacob Bradford presents today for phlebotomy per MD orders. Phlebotomy procedure started at 1312 and ended at 1324. 500 grams removed. Patient observed for 30 minutes after procedure without any incident. Patient tolerated procedure well. IV needle removed intact.

## 2016-08-30 LAB — IRON AND TIBC
%SAT: 17 % — ABNORMAL LOW (ref 20–55)
Iron: 91 ug/dL (ref 42–163)
TIBC: 546 ug/dL — ABNORMAL HIGH (ref 202–409)
UIBC: 456 ug/dL — ABNORMAL HIGH (ref 117–376)

## 2016-08-30 LAB — FERRITIN: Ferritin: 11 ng/ml — ABNORMAL LOW (ref 22–316)

## 2016-09-19 ENCOUNTER — Other Ambulatory Visit: Payer: Self-pay | Admitting: *Deleted

## 2016-09-19 DIAGNOSIS — M47817 Spondylosis without myelopathy or radiculopathy, lumbosacral region: Secondary | ICD-10-CM

## 2016-09-19 DIAGNOSIS — S63591S Other specified sprain of right wrist, sequela: Secondary | ICD-10-CM

## 2016-09-19 DIAGNOSIS — D45 Polycythemia vera: Secondary | ICD-10-CM

## 2016-09-19 MED ORDER — OXYMORPHONE HCL ER 20 MG PO TB12: 20.0000 mg | ORAL_TABLET | Freq: Three times a day (TID) | ORAL | 0 refills | Status: DC

## 2016-09-19 MED ORDER — MORPHINE SULFATE 30 MG PO TABS: ORAL_TABLET | ORAL | 0 refills | Status: DC

## 2016-10-22 ENCOUNTER — Other Ambulatory Visit: Payer: Self-pay | Admitting: *Deleted

## 2016-10-22 DIAGNOSIS — D45 Polycythemia vera: Secondary | ICD-10-CM

## 2016-10-22 DIAGNOSIS — S63591S Other specified sprain of right wrist, sequela: Secondary | ICD-10-CM

## 2016-10-22 DIAGNOSIS — M47817 Spondylosis without myelopathy or radiculopathy, lumbosacral region: Secondary | ICD-10-CM

## 2016-10-22 MED ORDER — MORPHINE SULFATE 30 MG PO TABS: ORAL_TABLET | ORAL | 0 refills | Status: DC

## 2016-10-22 MED ORDER — OXYMORPHONE HCL ER 20 MG PO TB12: 20.0000 mg | ORAL_TABLET | Freq: Three times a day (TID) | ORAL | 0 refills | Status: DC

## 2016-11-01 ENCOUNTER — Other Ambulatory Visit: Payer: Managed Care, Other (non HMO)

## 2016-11-01 ENCOUNTER — Ambulatory Visit: Payer: Managed Care, Other (non HMO) | Admitting: Hematology & Oncology

## 2016-11-13 ENCOUNTER — Ambulatory Visit (HOSPITAL_BASED_OUTPATIENT_CLINIC_OR_DEPARTMENT_OTHER): Payer: Managed Care, Other (non HMO)

## 2016-11-13 ENCOUNTER — Ambulatory Visit (HOSPITAL_BASED_OUTPATIENT_CLINIC_OR_DEPARTMENT_OTHER): Payer: Self-pay | Admitting: Family

## 2016-11-13 ENCOUNTER — Other Ambulatory Visit (HOSPITAL_BASED_OUTPATIENT_CLINIC_OR_DEPARTMENT_OTHER): Payer: Managed Care, Other (non HMO)

## 2016-11-13 VITALS — BP 142/80 | HR 83 | Temp 98.5°F | Resp 17 | Wt 254.0 lb

## 2016-11-13 DIAGNOSIS — D500 Iron deficiency anemia secondary to blood loss (chronic): Secondary | ICD-10-CM

## 2016-11-13 DIAGNOSIS — D45 Polycythemia vera: Secondary | ICD-10-CM

## 2016-11-13 DIAGNOSIS — R5383 Other fatigue: Secondary | ICD-10-CM

## 2016-11-13 DIAGNOSIS — D509 Iron deficiency anemia, unspecified: Secondary | ICD-10-CM | POA: Insufficient documentation

## 2016-11-13 DIAGNOSIS — M5136 Other intervertebral disc degeneration, lumbar region: Secondary | ICD-10-CM

## 2016-11-13 LAB — CBC WITH DIFFERENTIAL (CANCER CENTER ONLY)
BASO#: 0 10*3/uL (ref 0.0–0.2)
BASO%: 0.7 % (ref 0.0–2.0)
EOS ABS: 0.2 10*3/uL (ref 0.0–0.5)
EOS%: 3.5 % (ref 0.0–7.0)
HCT: 45.8 % (ref 38.7–49.9)
HEMOGLOBIN: 14.9 g/dL (ref 13.0–17.1)
LYMPH#: 1.1 10*3/uL (ref 0.9–3.3)
LYMPH%: 18.3 % (ref 14.0–48.0)
MCH: 26.5 pg — ABNORMAL LOW (ref 28.0–33.4)
MCHC: 32.5 g/dL (ref 32.0–35.9)
MCV: 82 fL (ref 82–98)
MONO#: 0.4 10*3/uL (ref 0.1–0.9)
MONO%: 6.8 % (ref 0.0–13.0)
NEUT%: 70.7 % (ref 40.0–80.0)
NEUTROS ABS: 4.3 10*3/uL (ref 1.5–6.5)
Platelets: 267 10*3/uL (ref 145–400)
RBC: 5.62 10*6/uL (ref 4.20–5.70)
RDW: 17.2 % — ABNORMAL HIGH (ref 11.1–15.7)
WBC: 6.1 10*3/uL (ref 4.0–10.0)

## 2016-11-13 LAB — CMP (CANCER CENTER ONLY)
ALBUMIN: 3.4 g/dL (ref 3.3–5.5)
ALT: 31 U/L (ref 10–47)
AST: 19 U/L (ref 11–38)
Alkaline Phosphatase: 109 U/L — ABNORMAL HIGH (ref 26–84)
BILIRUBIN TOTAL: 0.6 mg/dL (ref 0.20–1.60)
BUN, Bld: 15 mg/dL (ref 7–22)
CO2: 27 mEq/L (ref 18–33)
CREATININE: 1 mg/dL (ref 0.6–1.2)
Calcium: 8.6 mg/dL (ref 8.0–10.3)
Chloride: 105 mEq/L (ref 98–108)
Glucose, Bld: 117 mg/dL (ref 73–118)
Potassium: 4.1 mEq/L (ref 3.3–4.7)
SODIUM: 141 meq/L (ref 128–145)
TOTAL PROTEIN: 6.9 g/dL (ref 6.4–8.1)

## 2016-11-13 NOTE — Patient Instructions (Signed)
Polycythemia Vera Polycythemia vera (PV), or myeloproliferative disease, is a form of blood cancer in which the bone marrow makes too many (overproduces) red blood cells. The bone marrow may also make too many clotting cells (platelets) and white blood cells. Bone marrow is the spongy center of bones where blood cells are produced. Sometimes, there may be an overproduction of blood cells in the liver and spleen, causing those organs to become enlarged. Additionally, people who have PV are at a higher risk for stroke or heart attack because their blood may clot more easily. PV is a long-term disease. What are the causes? Almost all people who have PV have an abnormal gene (genetic mutation) that causes changes in the way that the bone marrow makes blood cells. This gene, which is called JAK2, is not passed along from parent to child (is not hereditary). It is not known what triggers the genetic mutation that causes the body to produce too many red blood cells. What increases the risk? This condition is more likely to develop in:  Males.  People who are 10 years of age or older.  What are the signs or symptoms? You may not have any symptoms in the early stage of PV. When symptoms develop, they may include:  Shortness of breath.  Dizziness.  Hot and flushed skin.  Itchy skin.  Sweats, especially night sweats.  Headache.  Tiredness.  Ringing in the ears.  Blurred vision or blind spots.  Bone pain.  Weight loss.  Fever.  Blood-tinged vomit or bowel movements.  How is this diagnosed? This condition may be diagnosed during a routine physical exam if you have a blood test called a complete blood count (CBC). Your health care provider also may suspect PV if you have symptoms. During the physical exam, your provider may find that you have an enlarged liver or spleen. You may also have tests to confirm the diagnosis. These may include:  A procedure to remove a sample of bone marrow  for testing (bone marrow biopsy).  Blood tests to check for: ? The JAK2 gene. ? Low levels of a hormone that helps to regulate blood production (erythropoietin).  How is this treated? There is no cure for PV, but treatment can help to control the disease. There are several types of treatment. No single treatment works for everyone. You will need to work with a blood cancer specialist (hematologist) to find the treatment that is best for you. Options include:  Periodically having some blood removed with a needle (drawn) to lower the number of red blood cells (phlebotomy).  Medicine. Your health care provider may recommend: ? Low-dose aspirin to lower your risk for blood clots. ? A medicine to reduce red blood cell production (hydroxyurea). ? A medicine to lower the number of red blood cells (interferon). ? A medicine that slows down the effects of JAK2 (ruxolitinib).  Follow these instructions at home:  Take over-the-counter and prescription medicines only as told by your health care provider.  Return to your normal activities as told by your health care provider. Ask your health care provider what activities are safe for you.  Do not use tobacco products, including cigarettes, chewing tobacco, or e-cigarettes. If you need help quitting, ask your health care provider.  Keep all follow-up visits as told by your health care provider. This is important. Contact a health care provider if:  You have side effects from your medicines.  Your symptoms change or get worse at home.  You have  blood in your stool or you vomit blood. Get help right away if:  You have sudden and severe pain in your abdomen.  You have chest pain or difficulty breathing.  You have signs of stroke, such as: ? Sudden numbness. ? Weakness of your face or arm. ? Confusion. ? Difficulty speaking or understanding speech. These symptoms may represent a serious problem that is an emergency. Do not wait to see if  the symptoms will go away. Get medical help right away. Call your local emergency services (911 in the [place] Do not drive yourself to the hospital. This information is not intended to replace advice given to you by your health care provider. Make sure you discuss any questions you have with your health care provider. Document Released: 02/13/2001 Document Revised: 10/27/2015 Document Reviewed: 12/01/2014 Elsevier Interactive Patient Education  [employer].

## 2016-11-13 NOTE — Progress Notes (Signed)
Hematology and Oncology Follow Up Visit  Jacob Bradford [id number] 12/24/1960 56 y.o. 11/13/2016   Principle Diagnosis:  Polycythemia vera - JAK2 negative Chronic low back pain secondary to degenerative disc disease - status post spinal fusion at L4-5 and L5-S1 Traumatic ligament damage to the right thumb and right knee  Current Therapy:   Phlebotomy to maintain hematocrit below 45% Aspirin 81 mg by mouth daily - taking off and on   Interim History:  Jacob Bradford is here today for follow-up. He is feeling quite fatigued and sleeping more than usual. His complexion is quite [place] as well. His Hct today is 45.8%.  He verbalized that he is taking his aspirin daily.  He has stopped smoking cigarettes and is now on a 0 nicotine vape. He is using this less often now.  He is staying busy walking for exercise.  His appetite comes and goes but he is making sure to stay well hydrated. His weight is stable.  No fever, chills, n/v, cough, rash, dizziness, SOB, chest pain, palpitations, abdominal pain or changes in bowel or bladder habits.  No swelling or tenderness in his extremities. The neuropathy in his feet is unchanged.   ECOG Performance Status: 1 - Symptomatic but completely ambulatory  Medications:  Allergies as of 11/13/2016   No Known Allergies     Medication List       Accurate as of 11/13/16 12:41 PM. Always use your most recent med list.          acetaminophen 500 MG tablet Commonly known as:  TYLENOL Take 1,000 mg by mouth every 6 (six) hours as needed for moderate pain.   ALPRAZolam 1 MG tablet Commonly known as:  XANAX Take 1 tablet (1 mg total) by mouth 3 (three) times daily as needed for anxiety.   aspirin 81 MG tablet Take 1 tablet (81 mg total) by mouth daily.   benazepril 20 MG tablet Commonly known as:  LOTENSIN Take 20 mg by mouth.   calcium-vitamin D 500-200 MG-UNIT tablet Take 1 tablet by mouth every evening.   Fish Oil 1000 MG Caps Take 1,000 mg  by mouth at bedtime.   GOODY HEADACHE PO Take 1 packet by mouth daily as needed (headaches).   hydrochlorothiazide 25 MG tablet Commonly known as:  HYDRODIURIL Take 25 mg by mouth every evening.   methocarbamol 500 MG tablet Commonly known as:  ROBAXIN Take 1 tablet (500 mg total) by mouth every 6 (six) hours as needed for muscle spasms.   morphine 30 MG tablet Commonly known as:  MSIR Take 1-2 pills, as needed, for pain every 6 hours.   multivitamin-lutein Caps capsule Take 1 capsule by mouth every evening.   omeprazole 20 MG capsule Commonly known as:  PRILOSEC Take 20 mg by mouth every evening.   oxymorphone 20 MG 12 hr tablet Commonly known as:  OPANA ER Take 1 tablet (20 mg total) by mouth every 8 (eight) hours.   polyethylene glycol packet Commonly known as:  MIRALAX / GLYCOLAX Take 17 g by mouth daily as needed for moderate constipation.   pyridOXINE 100 MG tablet Commonly known as:  VITAMIN B-6 Take 100 mg by mouth 2 (two) times daily at 8 am and 1 pm.   sildenafil 25 MG tablet Commonly known as:  VIAGRA Take 25-100 mg by mouth daily as needed for erectile dysfunction.   vitamin C 500 MG tablet Commonly known as:  ASCORBIC ACID Take 500 mg by mouth 2 (two) times  daily at 8 am and 1 pm.       Allergies: No Known Allergies  Past Medical History, Surgical history, Social history, and Family History were reviewed and updated.  Review of Systems: All other 10 point review of systems is negative.   Physical Exam:  weight is 254 lb (115.2 kg). His oral temperature is 98.5 F (36.9 C). His blood pressure is 142/80 (abnormal) and his pulse is 83. His respiration is 17 and oxygen saturation is 96%.   Wt Readings from Last 3 Encounters:  11/13/16 254 lb (115.2 kg)  08/29/16 253 lb (114.8 kg)  06/27/16 249 lb (112.9 kg)    Ocular: Sclerae unicteric, pupils equal, round and reactive to light Ear-nose-throat: Oropharynx clear, dentition fair Lymphatic: No  cervical, supraclavicular or axillary adenopathy Lungs no rales or rhonchi, good excursion bilaterally Heart regular rate and rhythm, no murmur appreciated Abd soft, nontender, positive bowel sounds, no liver or spleen tip palpated on exam, no fluid wave  MSK no focal spinal tenderness, no joint edema Neuro: non-focal, well-oriented, appropriate affect Breasts: Deferred   Lab Results  Component Value Date   WBC 6.1 11/13/2016   HGB 14.9 11/13/2016   HCT 45.8 11/13/2016   MCV 82 11/13/2016   PLT 267 11/13/2016   Lab Results  Component Value Date   FERRITIN 11 (L) 08/29/2016   IRON 91 08/29/2016   TIBC 546 (H) 08/29/2016   UIBC 456 (H) 08/29/2016   IRONPCTSAT 17 (L) 08/29/2016   Lab Results  Component Value Date   RETICCTPCT 1.4 02/22/2011   RBC 5.62 11/13/2016   RETICCTABS 77.0 02/22/2011   No results found for: KPAFRELGTCHN, LAMBDASER, KAPLAMBRATIO No results found for: IGGSERUM, IGA, IGMSERUM No results found for: Odetta Pink, SPEI   Chemistry      Component Value Date/Time   NA 141 11/13/2016 1123   NA 139 04/23/2016 1310   K 4.1 11/13/2016 1123   K 4.0 04/23/2016 1310   CL 105 11/13/2016 1123   CO2 27 11/13/2016 1123   CO2 26 04/23/2016 1310   BUN 15 11/13/2016 1123   BUN 13.4 04/23/2016 1310   CREATININE 1.0 11/13/2016 1123   CREATININE 1.1 04/23/2016 1310      Component Value Date/Time   CALCIUM 8.6 11/13/2016 1123   CALCIUM 9.0 04/23/2016 1310   ALKPHOS 109 (H) 11/13/2016 1123   ALKPHOS 108 04/23/2016 1310   AST 19 11/13/2016 1123   AST 15 04/23/2016 1310   ALT 31 11/13/2016 1123   ALT 21 04/23/2016 1310   BILITOT 0.60 11/13/2016 1123   BILITOT 0.88 04/23/2016 1310      Impression and Plan: Jacob Bradford is a very pleasant caucasian gentleman with polycythemia, JAK2 negative. He is symptomatic at this time with fatigue and ruddy complexion.  He is taking his aspirin daily as prescribed.  His Hct  today is 45.8 so we will proceed with phlebotomy.  We will plan to see him back again in 2 months for repeat lab work and follow-up.  He will contact our office with any questions or concerns. We can certainly see him sooner if need be.   Eliezer Bottom, NP 6/12/201812:41 PM

## 2016-11-15 ENCOUNTER — Other Ambulatory Visit: Payer: Self-pay | Admitting: *Deleted

## 2016-11-15 DIAGNOSIS — M47817 Spondylosis without myelopathy or radiculopathy, lumbosacral region: Secondary | ICD-10-CM

## 2016-11-15 DIAGNOSIS — S63591S Other specified sprain of right wrist, sequela: Secondary | ICD-10-CM

## 2016-11-15 DIAGNOSIS — D45 Polycythemia vera: Secondary | ICD-10-CM

## 2016-11-15 MED ORDER — MORPHINE SULFATE 30 MG PO TABS: ORAL_TABLET | ORAL | 0 refills | Status: DC

## 2016-11-15 MED ORDER — OXYMORPHONE HCL ER 20 MG PO TB12: 20.0000 mg | ORAL_TABLET | Freq: Three times a day (TID) | ORAL | 0 refills | Status: DC

## 2016-12-12 ENCOUNTER — Other Ambulatory Visit: Payer: Self-pay | Admitting: *Deleted

## 2016-12-12 DIAGNOSIS — D45 Polycythemia vera: Secondary | ICD-10-CM

## 2016-12-12 DIAGNOSIS — S63591S Other specified sprain of right wrist, sequela: Secondary | ICD-10-CM

## 2016-12-12 DIAGNOSIS — M47817 Spondylosis without myelopathy or radiculopathy, lumbosacral region: Secondary | ICD-10-CM

## 2016-12-12 MED ORDER — MORPHINE SULFATE 30 MG PO TABS: ORAL_TABLET | ORAL | 0 refills | Status: DC

## 2016-12-12 MED ORDER — OXYMORPHONE HCL ER 20 MG PO TB12: 20.0000 mg | ORAL_TABLET | Freq: Three times a day (TID) | ORAL | 0 refills | Status: DC

## 2017-01-10 ENCOUNTER — Other Ambulatory Visit: Payer: Self-pay | Admitting: *Deleted

## 2017-01-10 DIAGNOSIS — M47817 Spondylosis without myelopathy or radiculopathy, lumbosacral region: Secondary | ICD-10-CM

## 2017-01-10 DIAGNOSIS — S63591S Other specified sprain of right wrist, sequela: Secondary | ICD-10-CM

## 2017-01-10 DIAGNOSIS — D45 Polycythemia vera: Secondary | ICD-10-CM

## 2017-01-10 MED ORDER — MORPHINE SULFATE 30 MG PO TABS: ORAL_TABLET | ORAL | 0 refills | Status: DC

## 2017-01-10 MED ORDER — OXYMORPHONE HCL ER 20 MG PO TB12: 20.0000 mg | ORAL_TABLET | Freq: Three times a day (TID) | ORAL | 0 refills | Status: DC

## 2017-02-07 ENCOUNTER — Other Ambulatory Visit: Payer: Self-pay | Admitting: *Deleted

## 2017-02-07 DIAGNOSIS — S63591S Other specified sprain of right wrist, sequela: Secondary | ICD-10-CM

## 2017-02-07 DIAGNOSIS — D45 Polycythemia vera: Secondary | ICD-10-CM

## 2017-02-07 DIAGNOSIS — M47817 Spondylosis without myelopathy or radiculopathy, lumbosacral region: Secondary | ICD-10-CM

## 2017-02-07 MED ORDER — MORPHINE SULFATE 30 MG PO TABS: ORAL_TABLET | ORAL | 0 refills | Status: DC

## 2017-02-07 MED ORDER — OXYMORPHONE HCL ER 20 MG PO TB12
20.0000 mg | ORAL_TABLET | Freq: Three times a day (TID) | ORAL | 0 refills | Status: DC
Start: 2017-02-08 — End: 2017-03-11

## 2017-02-13 ENCOUNTER — Telehealth: Payer: Self-pay | Admitting: *Deleted

## 2017-02-13 NOTE — Telephone Encounter (Signed)
Patient states he had a physical and lab work for his job. His urine specimen came back with trace blood. He wants to know if he needs work up with Dr Marin Olp.  Spoke with Dr Marin Olp. He would like patient to follow up with his PCP for any possible issues related to the hematuria.    Patient is aware of recommendation

## 2017-03-11 ENCOUNTER — Other Ambulatory Visit: Payer: Self-pay | Admitting: *Deleted

## 2017-03-11 DIAGNOSIS — S63591S Other specified sprain of right wrist, sequela: Secondary | ICD-10-CM

## 2017-03-11 DIAGNOSIS — M47817 Spondylosis without myelopathy or radiculopathy, lumbosacral region: Secondary | ICD-10-CM

## 2017-03-11 DIAGNOSIS — D45 Polycythemia vera: Secondary | ICD-10-CM

## 2017-03-11 MED ORDER — MORPHINE SULFATE 30 MG PO TABS: ORAL_TABLET | ORAL | 0 refills | Status: DC

## 2017-03-11 MED ORDER — OXYMORPHONE HCL ER 20 MG PO TB12: 20.0000 mg | ORAL_TABLET | Freq: Three times a day (TID) | ORAL | 0 refills | Status: DC

## 2017-04-08 ENCOUNTER — Other Ambulatory Visit: Payer: Self-pay

## 2017-04-08 DIAGNOSIS — M47817 Spondylosis without myelopathy or radiculopathy, lumbosacral region: Secondary | ICD-10-CM

## 2017-04-08 DIAGNOSIS — S63591S Other specified sprain of right wrist, sequela: Secondary | ICD-10-CM

## 2017-04-08 DIAGNOSIS — D45 Polycythemia vera: Secondary | ICD-10-CM

## 2017-04-08 MED ORDER — OXYMORPHONE HCL ER 20 MG PO TB12
20.0000 mg | ORAL_TABLET | Freq: Three times a day (TID) | ORAL | 0 refills | Status: DC
Start: 2017-04-08 — End: 2017-05-01

## 2017-04-08 MED ORDER — MORPHINE SULFATE 30 MG PO TABS: ORAL_TABLET | ORAL | 0 refills | Status: DC

## 2017-05-01 ENCOUNTER — Other Ambulatory Visit: Payer: Self-pay | Admitting: *Deleted

## 2017-05-01 DIAGNOSIS — D45 Polycythemia vera: Secondary | ICD-10-CM

## 2017-05-01 DIAGNOSIS — M47817 Spondylosis without myelopathy or radiculopathy, lumbosacral region: Secondary | ICD-10-CM

## 2017-05-01 DIAGNOSIS — S63591S Other specified sprain of right wrist, sequela: Secondary | ICD-10-CM

## 2017-05-01 MED ORDER — MORPHINE SULFATE 30 MG PO TABS: ORAL_TABLET | ORAL | 0 refills | Status: DC

## 2017-05-01 MED ORDER — OXYMORPHONE HCL ER 20 MG PO TB12: 20.0000 mg | ORAL_TABLET | Freq: Three times a day (TID) | ORAL | 0 refills | Status: DC

## 2017-05-09 ENCOUNTER — Other Ambulatory Visit (HOSPITAL_BASED_OUTPATIENT_CLINIC_OR_DEPARTMENT_OTHER): Payer: Self-pay

## 2017-05-09 ENCOUNTER — Other Ambulatory Visit: Payer: Self-pay

## 2017-05-09 ENCOUNTER — Ambulatory Visit (HOSPITAL_BASED_OUTPATIENT_CLINIC_OR_DEPARTMENT_OTHER): Payer: Self-pay

## 2017-05-09 ENCOUNTER — Ambulatory Visit (HOSPITAL_BASED_OUTPATIENT_CLINIC_OR_DEPARTMENT_OTHER): Payer: Self-pay | Admitting: Family

## 2017-05-09 VITALS — BP 192/92 | HR 86 | Temp 98.3°F | Resp 18 | Wt 255.0 lb

## 2017-05-09 VITALS — BP 154/93 | HR 75

## 2017-05-09 DIAGNOSIS — D500 Iron deficiency anemia secondary to blood loss (chronic): Secondary | ICD-10-CM

## 2017-05-09 DIAGNOSIS — D45 Polycythemia vera: Secondary | ICD-10-CM

## 2017-05-09 DIAGNOSIS — R52 Pain, unspecified: Secondary | ICD-10-CM

## 2017-05-09 DIAGNOSIS — M5136 Other intervertebral disc degeneration, lumbar region: Secondary | ICD-10-CM

## 2017-05-09 DIAGNOSIS — I10 Essential (primary) hypertension: Secondary | ICD-10-CM

## 2017-05-09 LAB — COMPREHENSIVE METABOLIC PANEL
ALT: 30 U/L (ref 0–55)
AST: 22 U/L (ref 5–34)
Albumin: 3.6 g/dL (ref 3.5–5.0)
Alkaline Phosphatase: 96 U/L (ref 40–150)
Anion Gap: 13 mEq/L — ABNORMAL HIGH (ref 3–11)
BILIRUBIN TOTAL: 0.48 mg/dL (ref 0.20–1.20)
BUN: 15.2 mg/dL (ref 7.0–26.0)
CO2: 24 meq/L (ref 22–29)
CREATININE: 1.2 mg/dL (ref 0.7–1.3)
Calcium: 9.1 mg/dL (ref 8.4–10.4)
Chloride: 104 mEq/L (ref 98–109)
GLUCOSE: 141 mg/dL — AB (ref 70–140)
Potassium: 4.2 mEq/L (ref 3.5–5.1)
SODIUM: 141 meq/L (ref 136–145)
TOTAL PROTEIN: 7.3 g/dL (ref 6.4–8.3)

## 2017-05-09 LAB — IRON AND TIBC
%SAT: 10 % — ABNORMAL LOW (ref 20–55)
Iron: 54 ug/dL (ref 42–163)
TIBC: 534 ug/dL — AB (ref 202–409)
UIBC: 486 ug/dL — ABNORMAL HIGH (ref 117–376)

## 2017-05-09 LAB — CBC WITH DIFFERENTIAL (CANCER CENTER ONLY)
BASO#: 0.1 10*3/uL (ref 0.0–0.2)
BASO%: 1 % (ref 0.0–2.0)
EOS%: 3.6 % (ref 0.0–7.0)
Eosinophils Absolute: 0.3 10*3/uL (ref 0.0–0.5)
HCT: 47.6 % (ref 38.7–49.9)
HEMOGLOBIN: 15.3 g/dL (ref 13.0–17.1)
LYMPH#: 1.4 10*3/uL (ref 0.9–3.3)
LYMPH%: 20 % (ref 14.0–48.0)
MCH: 26.9 pg — ABNORMAL LOW (ref 28.0–33.4)
MCHC: 32.1 g/dL (ref 32.0–35.9)
MCV: 84 fL (ref 82–98)
MONO#: 0.5 10*3/uL (ref 0.1–0.9)
MONO%: 7.8 % (ref 0.0–13.0)
NEUT%: 67.6 % (ref 40.0–80.0)
NEUTROS ABS: 4.7 10*3/uL (ref 1.5–6.5)
PLATELETS: 158 10*3/uL (ref 145–400)
RBC: 5.69 10*6/uL (ref 4.20–5.70)
RDW: 15.9 % — ABNORMAL HIGH (ref 11.1–15.7)
WBC: 6.9 10*3/uL (ref 4.0–10.0)

## 2017-05-09 LAB — FERRITIN: Ferritin: 11 ng/ml — ABNORMAL LOW (ref 22–316)

## 2017-05-09 NOTE — Patient Instructions (Signed)

## 2017-05-09 NOTE — Progress Notes (Signed)
Jacob Bradford presents today for phlebotomy per MD orders.  Missed attempt by V. Tobey RN to right ac via phlebotomy kit, missed attempt by C. Miller RN via 18g catheter to right ac, missed attempt by D. Harris RN via phlebotomy kit to right lower anterior forearm.  Phlebotomy procedure started at 1115 and ended at 1135 by M. Morris Therapist, sports via 20 g to left anterior forearm.  535 cc removed. Patient tolerated procedure well. IV needle removed intact.  Pt refused to stay for 30 minutes and has no complaints at time of discharge.
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
## 2017-06-03 ENCOUNTER — Other Ambulatory Visit: Payer: Self-pay | Admitting: *Deleted

## 2017-06-03 DIAGNOSIS — D45 Polycythemia vera: Secondary | ICD-10-CM

## 2017-06-03 DIAGNOSIS — M47817 Spondylosis without myelopathy or radiculopathy, lumbosacral region: Secondary | ICD-10-CM

## 2017-06-03 DIAGNOSIS — S63591S Other specified sprain of right wrist, sequela: Secondary | ICD-10-CM

## 2017-06-03 MED ORDER — OXYMORPHONE HCL ER 20 MG PO TB12: 20.0000 mg | ORAL_TABLET | Freq: Three times a day (TID) | ORAL | 0 refills | Status: DC

## 2017-06-03 MED ORDER — MORPHINE SULFATE 30 MG PO TABS: ORAL_TABLET | ORAL | 0 refills | Status: DC

## 2017-07-01 ENCOUNTER — Other Ambulatory Visit: Payer: Self-pay | Admitting: *Deleted

## 2017-07-01 DIAGNOSIS — S63591S Other specified sprain of right wrist, sequela: Secondary | ICD-10-CM

## 2017-07-01 DIAGNOSIS — D45 Polycythemia vera: Secondary | ICD-10-CM

## 2017-07-01 DIAGNOSIS — M47817 Spondylosis without myelopathy or radiculopathy, lumbosacral region: Secondary | ICD-10-CM

## 2017-07-01 MED ORDER — MORPHINE SULFATE 30 MG PO TABS: ORAL_TABLET | ORAL | 0 refills | Status: DC

## 2017-07-01 MED ORDER — OXYMORPHONE HCL ER 20 MG PO TB12: 20.0000 mg | ORAL_TABLET | Freq: Three times a day (TID) | ORAL | 0 refills | Status: DC

## 2017-07-10 ENCOUNTER — Other Ambulatory Visit: Payer: Self-pay

## 2017-07-10 ENCOUNTER — Inpatient Hospital Stay: Payer: PRIVATE HEALTH INSURANCE

## 2017-07-10 ENCOUNTER — Inpatient Hospital Stay: Payer: PRIVATE HEALTH INSURANCE | Attending: Family | Admitting: Family

## 2017-07-10 ENCOUNTER — Encounter: Payer: Self-pay | Admitting: Family

## 2017-07-10 VITALS — BP 151/78 | HR 64 | Temp 97.6°F | Resp 20 | Wt 264.0 lb

## 2017-07-10 DIAGNOSIS — K219 Gastro-esophageal reflux disease without esophagitis: Secondary | ICD-10-CM

## 2017-07-10 DIAGNOSIS — M79641 Pain in right hand: Secondary | ICD-10-CM

## 2017-07-10 DIAGNOSIS — D751 Secondary polycythemia: Secondary | ICD-10-CM

## 2017-07-10 DIAGNOSIS — Z720 Tobacco use: Secondary | ICD-10-CM

## 2017-07-10 DIAGNOSIS — D500 Iron deficiency anemia secondary to blood loss (chronic): Secondary | ICD-10-CM

## 2017-07-10 DIAGNOSIS — Z981 Arthrodesis status: Secondary | ICD-10-CM

## 2017-07-10 DIAGNOSIS — S63591S Other specified sprain of right wrist, sequela: Secondary | ICD-10-CM

## 2017-07-10 DIAGNOSIS — D45 Polycythemia vera: Secondary | ICD-10-CM

## 2017-07-10 DIAGNOSIS — M5137 Other intervertebral disc degeneration, lumbosacral region: Secondary | ICD-10-CM

## 2017-07-10 DIAGNOSIS — M47817 Spondylosis without myelopathy or radiculopathy, lumbosacral region: Secondary | ICD-10-CM

## 2017-07-10 DIAGNOSIS — M545 Low back pain: Secondary | ICD-10-CM

## 2017-07-10 DIAGNOSIS — G8929 Other chronic pain: Secondary | ICD-10-CM

## 2017-07-10 DIAGNOSIS — M79604 Pain in right leg: Secondary | ICD-10-CM

## 2017-07-10 LAB — CBC WITH DIFFERENTIAL (CANCER CENTER ONLY)
Basophils Absolute: 0.1 10*3/uL (ref 0.0–0.1)
Basophils Relative: 2 %
EOS ABS: 0.3 10*3/uL (ref 0.0–0.5)
Eosinophils Relative: 7 %
HCT: 41.7 % (ref 38.7–49.9)
HEMOGLOBIN: 13 g/dL (ref 13.0–17.1)
LYMPHS ABS: 1.2 10*3/uL (ref 0.9–3.3)
LYMPHS PCT: 27 %
MCH: 26.5 pg — AB (ref 28.0–33.4)
MCHC: 31.2 g/dL — AB (ref 32.0–35.9)
MCV: 85.1 fL (ref 82.0–98.0)
MONOS PCT: 12 %
Monocytes Absolute: 0.6 10*3/uL (ref 0.1–0.9)
NEUTROS PCT: 52 %
Neutro Abs: 2.4 10*3/uL (ref 1.5–6.5)
Platelet Count: 74 10*3/uL — ABNORMAL LOW (ref 145–400)
RBC: 4.9 MIL/uL (ref 4.20–5.70)
RDW: 15.5 % (ref 11.1–15.7)
WBC Count: 4.6 10*3/uL (ref 4.0–10.0)

## 2017-07-10 LAB — CMP (CANCER CENTER ONLY)
ALK PHOS: 105 U/L — AB (ref 26–84)
ALT: 33 U/L (ref 0–55)
ANION GAP: 4 — AB (ref 5–15)
AST: 33 U/L (ref 5–34)
Albumin: 3.4 g/dL — ABNORMAL LOW (ref 3.5–5.0)
BUN: 14 mg/dL (ref 7–22)
CHLORIDE: 107 mmol/L (ref 98–108)
CO2: 29 mmol/L (ref 18–33)
Calcium: 9 mg/dL (ref 8.0–10.3)
Creatinine: 1 mg/dL (ref 0.70–1.30)
Glucose, Bld: 144 mg/dL — ABNORMAL HIGH (ref 73–118)
POTASSIUM: 3.9 mmol/L (ref 3.3–4.7)
SODIUM: 140 mmol/L (ref 128–145)
TOTAL PROTEIN: 6.5 g/dL (ref 6.4–8.1)
Total Bilirubin: 0.6 mg/dL (ref 0.2–1.2)

## 2017-07-10 LAB — FERRITIN: FERRITIN: 15 ng/mL — AB (ref 22–316)

## 2017-07-10 LAB — IRON AND TIBC
IRON: 52 ug/dL (ref 42–163)
SATURATION RATIOS: 11 % — AB (ref 42–163)
TIBC: 456 ug/dL — AB (ref 202–409)
UIBC: 405 ug/dL

## 2017-07-10 MED ORDER — MORPHINE SULFATE 30 MG PO TABS: ORAL_TABLET | ORAL | 0 refills | Status: DC

## 2017-07-10 MED ORDER — MORPHINE SULFATE ER 15 MG PO TBCR
15.0000 mg | EXTENDED_RELEASE_TABLET | Freq: Two times a day (BID) | ORAL | 0 refills | Status: DC
Start: 2017-07-10 — End: 2017-08-05
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
## 2017-07-25 ENCOUNTER — Telehealth: Payer: Self-pay | Admitting: Hematology & Oncology

## 2017-07-25 NOTE — Telephone Encounter (Signed)
Faxed medical records to: [facility] P: [phone] F: [phone]     COPY SCANNED

## 2017-08-05 ENCOUNTER — Other Ambulatory Visit: Payer: Self-pay | Admitting: *Deleted

## 2017-08-05 DIAGNOSIS — M47817 Spondylosis without myelopathy or radiculopathy, lumbosacral region: Secondary | ICD-10-CM

## 2017-08-05 DIAGNOSIS — S63591S Other specified sprain of right wrist, sequela: Secondary | ICD-10-CM

## 2017-08-05 DIAGNOSIS — D45 Polycythemia vera: Secondary | ICD-10-CM

## 2017-08-06 MED ORDER — OXYMORPHONE HCL ER 20 MG PO TB12
20.0000 mg | ORAL_TABLET | Freq: Three times a day (TID) | ORAL | 0 refills | Status: DC
Start: 2017-08-06 — End: 2017-09-04

## 2017-08-27 ENCOUNTER — Other Ambulatory Visit: Payer: Self-pay | Admitting: *Deleted

## 2017-08-27 DIAGNOSIS — M47817 Spondylosis without myelopathy or radiculopathy, lumbosacral region: Secondary | ICD-10-CM

## 2017-08-27 DIAGNOSIS — S63591S Other specified sprain of right wrist, sequela: Secondary | ICD-10-CM

## 2017-08-28 MED ORDER — MORPHINE SULFATE 30 MG PO TABS: ORAL_TABLET | ORAL | 0 refills | Status: DC

## 2017-09-04 ENCOUNTER — Other Ambulatory Visit: Payer: Self-pay | Admitting: *Deleted

## 2017-09-04 DIAGNOSIS — M47817 Spondylosis without myelopathy or radiculopathy, lumbosacral region: Secondary | ICD-10-CM

## 2017-09-04 DIAGNOSIS — S63591S Other specified sprain of right wrist, sequela: Secondary | ICD-10-CM

## 2017-09-04 DIAGNOSIS — D45 Polycythemia vera: Secondary | ICD-10-CM

## 2017-09-04 MED ORDER — OXYMORPHONE HCL ER 20 MG PO TB12: 20.0000 mg | ORAL_TABLET | Freq: Three times a day (TID) | ORAL | 0 refills | Status: DC

## 2017-09-24 ENCOUNTER — Other Ambulatory Visit: Payer: Self-pay | Admitting: *Deleted

## 2017-09-24 ENCOUNTER — Telehealth: Payer: Self-pay | Admitting: *Deleted

## 2017-09-24 DIAGNOSIS — S63591S Other specified sprain of right wrist, sequela: Secondary | ICD-10-CM

## 2017-09-24 DIAGNOSIS — M47817 Spondylosis without myelopathy or radiculopathy, lumbosacral region: Secondary | ICD-10-CM

## 2017-09-24 MED ORDER — MORPHINE SULFATE 30 MG PO TABS: ORAL_TABLET | ORAL | 0 refills | Status: DC

## 2017-09-24 NOTE — Telephone Encounter (Signed)
Message received from patient requesting a refill for MSIR.  Script routed and completed by Dr. Marin Olp.

## 2017-09-26 ENCOUNTER — Other Ambulatory Visit: Payer: Self-pay | Admitting: *Deleted

## 2017-09-26 DIAGNOSIS — S63591S Other specified sprain of right wrist, sequela: Secondary | ICD-10-CM

## 2017-09-26 DIAGNOSIS — M47817 Spondylosis without myelopathy or radiculopathy, lumbosacral region: Secondary | ICD-10-CM

## 2017-09-27 MED ORDER — MORPHINE SULFATE 30 MG PO TABS: ORAL_TABLET | ORAL | 0 refills | Status: DC

## 2017-09-30 ENCOUNTER — Other Ambulatory Visit: Payer: Self-pay | Admitting: *Deleted

## 2017-09-30 DIAGNOSIS — S63591S Other specified sprain of right wrist, sequela: Secondary | ICD-10-CM

## 2017-09-30 DIAGNOSIS — D45 Polycythemia vera: Secondary | ICD-10-CM

## 2017-09-30 DIAGNOSIS — M47817 Spondylosis without myelopathy or radiculopathy, lumbosacral region: Secondary | ICD-10-CM

## 2017-10-01 MED ORDER — OXYMORPHONE HCL ER 20 MG PO TB12: 20.0000 mg | ORAL_TABLET | Freq: Three times a day (TID) | ORAL | 0 refills | Status: DC

## 2017-10-09 ENCOUNTER — Inpatient Hospital Stay: Payer: PRIVATE HEALTH INSURANCE | Attending: Family | Admitting: Family

## 2017-10-09 ENCOUNTER — Inpatient Hospital Stay: Payer: PRIVATE HEALTH INSURANCE

## 2017-10-09 DIAGNOSIS — M5137 Other intervertebral disc degeneration, lumbosacral region: Secondary | ICD-10-CM | POA: Insufficient documentation

## 2017-10-09 DIAGNOSIS — M549 Dorsalgia, unspecified: Secondary | ICD-10-CM | POA: Insufficient documentation

## 2017-10-09 DIAGNOSIS — M542 Cervicalgia: Secondary | ICD-10-CM | POA: Insufficient documentation

## 2017-10-09 DIAGNOSIS — R635 Abnormal weight gain: Secondary | ICD-10-CM | POA: Insufficient documentation

## 2017-10-09 DIAGNOSIS — D751 Secondary polycythemia: Secondary | ICD-10-CM | POA: Insufficient documentation

## 2017-10-10 ENCOUNTER — Other Ambulatory Visit: Payer: Self-pay | Admitting: *Deleted

## 2017-10-29 ENCOUNTER — Telehealth: Payer: Self-pay | Admitting: *Deleted

## 2017-10-29 ENCOUNTER — Other Ambulatory Visit: Payer: Self-pay | Admitting: *Deleted

## 2017-10-29 DIAGNOSIS — M47817 Spondylosis without myelopathy or radiculopathy, lumbosacral region: Secondary | ICD-10-CM

## 2017-10-29 DIAGNOSIS — D45 Polycythemia vera: Secondary | ICD-10-CM

## 2017-10-29 DIAGNOSIS — S63591S Other specified sprain of right wrist, sequela: Secondary | ICD-10-CM

## 2017-10-29 MED ORDER — MORPHINE SULFATE 30 MG PO TABS: ORAL_TABLET | ORAL | 0 refills | Status: DC

## 2017-10-29 MED ORDER — OXYMORPHONE HCL ER 20 MG PO TB12: 20.0000 mg | ORAL_TABLET | Freq: Three times a day (TID) | ORAL | 0 refills | Status: DC

## 2017-10-29 NOTE — Telephone Encounter (Signed)
Patient is calling the office for a refill on his OPANA and MSIR. Patient was a no-show to his last appointment. Medications were inadvertently sent to pharmacy. Called [facility] and placed prescriptions on HOLD pending patient making a follow up appointment.   Called patient and notified him of needing to see Dr Marin Olp or NP prior to next refill. Patient transferred to scheduler to make new appointment.

## 2017-11-01 ENCOUNTER — Inpatient Hospital Stay: Payer: PRIVATE HEALTH INSURANCE

## 2017-11-01 ENCOUNTER — Telehealth: Payer: Self-pay | Admitting: Hematology & Oncology

## 2017-11-01 ENCOUNTER — Other Ambulatory Visit: Payer: Self-pay

## 2017-11-01 ENCOUNTER — Inpatient Hospital Stay (HOSPITAL_BASED_OUTPATIENT_CLINIC_OR_DEPARTMENT_OTHER): Payer: PRIVATE HEALTH INSURANCE | Admitting: Hematology & Oncology

## 2017-11-01 ENCOUNTER — Encounter: Payer: Self-pay | Admitting: Hematology & Oncology

## 2017-11-01 VITALS — BP 154/80 | HR 88 | Temp 97.8°F | Resp 19 | Wt 254.0 lb

## 2017-11-01 DIAGNOSIS — M549 Dorsalgia, unspecified: Secondary | ICD-10-CM

## 2017-11-01 DIAGNOSIS — M542 Cervicalgia: Secondary | ICD-10-CM

## 2017-11-01 DIAGNOSIS — M5137 Other intervertebral disc degeneration, lumbosacral region: Secondary | ICD-10-CM

## 2017-11-01 DIAGNOSIS — R635 Abnormal weight gain: Secondary | ICD-10-CM

## 2017-11-01 DIAGNOSIS — D751 Secondary polycythemia: Secondary | ICD-10-CM

## 2017-11-01 DIAGNOSIS — D500 Iron deficiency anemia secondary to blood loss (chronic): Secondary | ICD-10-CM

## 2017-11-01 DIAGNOSIS — D45 Polycythemia vera: Secondary | ICD-10-CM

## 2017-11-01 LAB — CBC WITH DIFFERENTIAL (CANCER CENTER ONLY)
BASOS ABS: 0 10*3/uL (ref 0.0–0.1)
Basophils Relative: 0 %
Eosinophils Absolute: 0.1 10*3/uL (ref 0.0–0.5)
Eosinophils Relative: 2 %
HEMATOCRIT: 48.8 % (ref 38.7–49.9)
HEMOGLOBIN: 15.9 g/dL (ref 13.0–17.1)
LYMPHS PCT: 14 %
Lymphs Abs: 1 10*3/uL (ref 0.9–3.3)
MCH: 27.5 pg — ABNORMAL LOW (ref 28.0–33.4)
MCHC: 32.6 g/dL (ref 32.0–35.9)
MCV: 84.3 fL (ref 82.0–98.0)
MONOS PCT: 4 %
Monocytes Absolute: 0.3 10*3/uL (ref 0.1–0.9)
NEUTROS PCT: 80 %
Neutro Abs: 5.6 10*3/uL (ref 1.5–6.5)
Platelet Count: 172 10*3/uL (ref 145–400)
RBC: 5.79 MIL/uL — ABNORMAL HIGH (ref 4.20–5.70)
RDW: 15.2 % (ref 11.1–15.7)
WBC Count: 7.1 10*3/uL (ref 4.0–10.0)

## 2017-11-01 LAB — CMP (CANCER CENTER ONLY)
ALK PHOS: 99 U/L — AB (ref 26–84)
ALT: 36 U/L (ref 10–47)
ANION GAP: 13 (ref 5–15)
AST: 28 U/L (ref 11–38)
Albumin: 3.6 g/dL (ref 3.5–5.0)
BILIRUBIN TOTAL: 1.1 mg/dL (ref 0.2–1.6)
BUN: 10 mg/dL (ref 7–22)
CALCIUM: 9.2 mg/dL (ref 8.0–10.3)
CO2: 26 mmol/L (ref 18–33)
CREATININE: 1.1 mg/dL (ref 0.60–1.20)
Chloride: 102 mmol/L (ref 98–108)
Glucose, Bld: 198 mg/dL — ABNORMAL HIGH (ref 73–118)
Potassium: 3.6 mmol/L (ref 3.3–4.7)
Sodium: 141 mmol/L (ref 128–145)
TOTAL PROTEIN: 7.2 g/dL (ref 6.4–8.1)

## 2017-11-01 MED ORDER — MORPHINE SULFATE ER 60 MG PO TBCR: 60.0000 mg | EXTENDED_RELEASE_TABLET | Freq: Two times a day (BID) | ORAL | 0 refills | Status: DC

## 2017-11-01 NOTE — Telephone Encounter (Signed)
NO LOS FOR OFFICE VISIT 5/31
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
## 2017-11-01 NOTE — Progress Notes (Signed)
1 unit phlebotomy performed over 7 minutes using a 16 gauge phlebotomy set. Patient tolerated well. Nourishment provided.   Patient states he has not took  his blood pressure medication in 2 weeks. Patient states he will take his blood medication when he arrives at home today. Patient denies dizziness, headaches, vision issues or any other concerns. Patient discharged ambulatory.

## 2017-11-01 NOTE — Patient Instructions (Signed)
Therapeutic Phlebotomy Therapeutic phlebotomy is the controlled removal of blood from a person's body for the purpose of treating a medical condition. The procedure is similar to donating blood. Usually, about a pint (470 mL, or 0.47L) of blood is removed. The average adult has 9-12 pints (4.3-5.7 L) of blood. Therapeutic phlebotomy may be used to treat the following medical conditions:  Hemochromatosis. This is a condition in which the blood contains too much iron.  Polycythemia vera. This is a condition in which the blood contains too many red blood cells.  Porphyria cutanea tarda. This is a disease in which an important part of hemoglobin is not made properly. It results in the buildup of abnormal amounts of porphyrins in the body.  Sickle cell disease. This is a condition in which the red blood cells form an abnormal crescent shape rather than a round shape.  Tell a health care provider about:  Any allergies you have.  All medicines you are taking, including vitamins, herbs, eye drops, creams, and over-the-counter medicines.  Any problems you or family members have had with anesthetic medicines.  Any blood disorders you have.  Any surgeries you have had.  Any medical conditions you have. What are the risks? Generally, this is a safe procedure. However, problems may occur, including:  Nausea or light-headedness.  Low blood pressure.  Soreness, bleeding, swelling, or bruising at the needle insertion site.  Infection.  What happens before the procedure?  Follow instructions from your health care provider about eating or drinking restrictions.  Ask your health care provider about changing or stopping your regular medicines. This is especially important if you are taking diabetes medicines or blood thinners.  Wear clothing with sleeves that can be raised above the elbow.  Plan to have someone take you home after the procedure.  You may have a blood sample taken. What  happens during the procedure?  A needle will be inserted into one of your veins.  Tubing and a collection bag will be attached to that needle.  Blood will flow through the needle and tubing into the collection bag.  You may be asked to open and close your hand slowly and continually during the entire collection.  After the specified amount of blood has been removed from your body, the collection bag and tubing will be clamped.  The needle will be removed from your vein.  Pressure will be held on the site of the needle insertion to stop the bleeding.  A bandage (dressing) will be placed over the needle insertion site. The procedure may vary among health care providers and hospitals. What happens after the procedure?  Your recovery will be assessed and monitored.  You can return to your normal activities as directed by your health care provider. This information is not intended to replace advice given to you by your health care provider. Make sure you discuss any questions you have with your health care provider. Document Released: 10/23/2010 Document Revised: 01/21/2016 Document Reviewed: 05/17/2014 Elsevier Interactive Patient Education  2018 [employer] Inc[employer]  

## 2017-11-04 LAB — IRON AND TIBC
IRON: 86 ug/dL (ref 42–163)
Saturation Ratios: 17 % — ABNORMAL LOW (ref 42–163)
TIBC: 493 ug/dL — AB (ref 202–409)
UIBC: 407 ug/dL

## 2017-11-04 LAB — FERRITIN: Ferritin: 11 ng/mL — ABNORMAL LOW (ref 22–316)

## 2017-11-13 ENCOUNTER — Other Ambulatory Visit: Payer: Self-pay | Admitting: Hematology & Oncology

## 2017-11-13 DIAGNOSIS — M47817 Spondylosis without myelopathy or radiculopathy, lumbosacral region: Secondary | ICD-10-CM

## 2017-11-13 DIAGNOSIS — S63591S Other specified sprain of right wrist, sequela: Secondary | ICD-10-CM

## 2017-11-13 MED ORDER — MORPHINE SULFATE 30 MG PO TABS: ORAL_TABLET | ORAL | 0 refills | Status: DC

## 2017-12-03 ENCOUNTER — Other Ambulatory Visit: Payer: Self-pay | Admitting: Hematology & Oncology

## 2017-12-03 DIAGNOSIS — S63591S Other specified sprain of right wrist, sequela: Secondary | ICD-10-CM

## 2017-12-03 DIAGNOSIS — M47817 Spondylosis without myelopathy or radiculopathy, lumbosacral region: Secondary | ICD-10-CM

## 2017-12-03 MED ORDER — MORPHINE SULFATE 30 MG PO TABS: ORAL_TABLET | ORAL | 0 refills | Status: DC

## 2017-12-03 MED ORDER — MORPHINE SULFATE ER 60 MG PO TBCR: 60.0000 mg | EXTENDED_RELEASE_TABLET | Freq: Two times a day (BID) | ORAL | 0 refills | Status: DC

## 2018-01-01 ENCOUNTER — Other Ambulatory Visit: Payer: Self-pay | Admitting: Hematology & Oncology

## 2018-01-01 MED ORDER — MORPHINE SULFATE ER 60 MG PO TBCR: 60.0000 mg | EXTENDED_RELEASE_TABLET | Freq: Two times a day (BID) | ORAL | 0 refills | Status: DC

## 2018-01-08 ENCOUNTER — Other Ambulatory Visit: Payer: Self-pay | Admitting: Hematology & Oncology

## 2018-01-08 DIAGNOSIS — S63591S Other specified sprain of right wrist, sequela: Secondary | ICD-10-CM

## 2018-01-08 DIAGNOSIS — M47817 Spondylosis without myelopathy or radiculopathy, lumbosacral region: Secondary | ICD-10-CM

## 2018-01-08 MED ORDER — MORPHINE SULFATE 30 MG PO TABS: ORAL_TABLET | ORAL | 0 refills | Status: DC

## 2018-01-31 ENCOUNTER — Inpatient Hospital Stay: Payer: PRIVATE HEALTH INSURANCE

## 2018-01-31 ENCOUNTER — Other Ambulatory Visit: Payer: Self-pay

## 2018-01-31 ENCOUNTER — Encounter: Payer: Self-pay | Admitting: Hematology & Oncology

## 2018-01-31 ENCOUNTER — Inpatient Hospital Stay: Payer: PRIVATE HEALTH INSURANCE | Attending: Hematology & Oncology | Admitting: Hematology & Oncology

## 2018-01-31 VITALS — BP 124/88 | HR 88

## 2018-01-31 DIAGNOSIS — Z7982 Long term (current) use of aspirin: Secondary | ICD-10-CM | POA: Insufficient documentation

## 2018-01-31 DIAGNOSIS — S63591S Other specified sprain of right wrist, sequela: Secondary | ICD-10-CM

## 2018-01-31 DIAGNOSIS — D45 Polycythemia vera: Secondary | ICD-10-CM

## 2018-01-31 DIAGNOSIS — M24241 Disorder of ligament, right hand: Secondary | ICD-10-CM | POA: Insufficient documentation

## 2018-01-31 DIAGNOSIS — M542 Cervicalgia: Secondary | ICD-10-CM | POA: Insufficient documentation

## 2018-01-31 DIAGNOSIS — D751 Secondary polycythemia: Secondary | ICD-10-CM | POA: Insufficient documentation

## 2018-01-31 DIAGNOSIS — M5137 Other intervertebral disc degeneration, lumbosacral region: Secondary | ICD-10-CM | POA: Insufficient documentation

## 2018-01-31 DIAGNOSIS — M238X1 Other internal derangements of right knee: Secondary | ICD-10-CM | POA: Insufficient documentation

## 2018-01-31 DIAGNOSIS — M47817 Spondylosis without myelopathy or radiculopathy, lumbosacral region: Secondary | ICD-10-CM

## 2018-01-31 LAB — CBC WITH DIFFERENTIAL (CANCER CENTER ONLY)
BASOS ABS: 0 10*3/uL (ref 0.0–0.1)
BASOS PCT: 1 %
Eosinophils Absolute: 0.1 10*3/uL (ref 0.0–0.5)
Eosinophils Relative: 2 %
HEMATOCRIT: 47.7 % (ref 38.7–49.9)
Hemoglobin: 15.5 g/dL (ref 13.0–17.1)
Lymphocytes Relative: 20 %
Lymphs Abs: 1.5 10*3/uL (ref 0.9–3.3)
MCH: 28 pg (ref 28.0–33.4)
MCHC: 32.5 g/dL (ref 32.0–35.9)
MCV: 86.3 fL (ref 82.0–98.0)
MONO ABS: 0.5 10*3/uL (ref 0.1–0.9)
MONOS PCT: 7 %
Neutro Abs: 5.1 10*3/uL (ref 1.5–6.5)
Neutrophils Relative %: 70 %
PLATELETS: 206 10*3/uL (ref 145–400)
RBC: 5.53 MIL/uL (ref 4.20–5.70)
RDW: 15.8 % — AB (ref 11.1–15.7)
WBC Count: 7.2 10*3/uL (ref 4.0–10.0)

## 2018-01-31 LAB — CMP (CANCER CENTER ONLY)
ALBUMIN: 4.1 g/dL (ref 3.5–5.0)
ALT: 31 U/L (ref 10–47)
ANION GAP: 11 (ref 5–15)
AST: 28 U/L (ref 11–38)
Alkaline Phosphatase: 57 U/L (ref 26–84)
BILIRUBIN TOTAL: 0.8 mg/dL (ref 0.2–1.6)
BUN: 15 mg/dL (ref 7–22)
CALCIUM: 10.3 mg/dL (ref 8.0–10.3)
CHLORIDE: 106 mmol/L (ref 98–108)
CO2: 32 mmol/L (ref 18–33)
Creatinine: 1.7 mg/dL — ABNORMAL HIGH (ref 0.60–1.20)
GLUCOSE: 174 mg/dL — AB (ref 73–118)
Potassium: 4.3 mmol/L (ref 3.3–4.7)
Sodium: 149 mmol/L — ABNORMAL HIGH (ref 128–145)
Total Protein: 7.6 g/dL (ref 6.4–8.1)

## 2018-01-31 LAB — IRON AND TIBC
IRON: 196 ug/dL — AB (ref 42–163)
Saturation Ratios: 34 % — ABNORMAL LOW (ref 42–163)
TIBC: 581 ug/dL — AB (ref 202–409)
UIBC: 385 ug/dL

## 2018-01-31 LAB — FERRITIN: Ferritin: 31 ng/mL (ref 24–336)

## 2018-01-31 MED ORDER — MORPHINE SULFATE ER 60 MG PO TBCR: 60.0000 mg | EXTENDED_RELEASE_TABLET | Freq: Two times a day (BID) | ORAL | 0 refills | Status: DC

## 2018-01-31 MED ORDER — MORPHINE SULFATE 30 MG PO TABS: ORAL_TABLET | ORAL | 0 refills | Status: DC
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
## 2018-01-31 NOTE — Progress Notes (Signed)
Jacob Bradford presents today for phlebotomy per MD orders. Phlebotomy procedure started at 1115 and ended at 1122. 545 grams removed. Patient refused to stay for 30 minute observation.  Drink only taken.  Patient tolerated procedure well. IV needle removed intact.  Pressure dressing clean, dry and intact to right ac at time of discharge.

## 2018-02-10 ENCOUNTER — Other Ambulatory Visit: Payer: Self-pay | Admitting: Hematology & Oncology

## 2018-02-10 DIAGNOSIS — M47817 Spondylosis without myelopathy or radiculopathy, lumbosacral region: Secondary | ICD-10-CM

## 2018-02-10 DIAGNOSIS — S63591S Other specified sprain of right wrist, sequela: Secondary | ICD-10-CM

## 2018-02-10 MED ORDER — MORPHINE SULFATE 30 MG PO TABS: ORAL_TABLET | ORAL | 0 refills | Status: DC

## 2018-03-03 ENCOUNTER — Other Ambulatory Visit: Payer: Self-pay | Admitting: Hematology & Oncology

## 2018-03-03 DIAGNOSIS — M47817 Spondylosis without myelopathy or radiculopathy, lumbosacral region: Secondary | ICD-10-CM

## 2018-03-03 DIAGNOSIS — S63591S Other specified sprain of right wrist, sequela: Secondary | ICD-10-CM

## 2018-03-03 MED ORDER — MORPHINE SULFATE 30 MG PO TABS: ORAL_TABLET | ORAL | 0 refills | Status: DC

## 2018-03-03 MED ORDER — MORPHINE SULFATE ER 60 MG PO TBCR: 60.0000 mg | EXTENDED_RELEASE_TABLET | Freq: Two times a day (BID) | ORAL | 0 refills | Status: DC

## 2018-03-14 ENCOUNTER — Inpatient Hospital Stay: Payer: PRIVATE HEALTH INSURANCE | Attending: Hematology & Oncology | Admitting: Hematology & Oncology

## 2018-03-14 ENCOUNTER — Other Ambulatory Visit: Payer: Self-pay

## 2018-03-14 ENCOUNTER — Inpatient Hospital Stay: Payer: PRIVATE HEALTH INSURANCE

## 2018-03-14 ENCOUNTER — Encounter: Payer: Self-pay | Admitting: Hematology & Oncology

## 2018-03-14 VITALS — BP 132/72 | HR 59 | Temp 97.7°F | Resp 18 | Wt 263.0 lb

## 2018-03-14 DIAGNOSIS — Z981 Arthrodesis status: Secondary | ICD-10-CM

## 2018-03-14 DIAGNOSIS — G8929 Other chronic pain: Secondary | ICD-10-CM | POA: Insufficient documentation

## 2018-03-14 DIAGNOSIS — D45 Polycythemia vera: Secondary | ICD-10-CM

## 2018-03-14 DIAGNOSIS — Z79899 Other long term (current) drug therapy: Secondary | ICD-10-CM | POA: Insufficient documentation

## 2018-03-14 DIAGNOSIS — M47817 Spondylosis without myelopathy or radiculopathy, lumbosacral region: Secondary | ICD-10-CM

## 2018-03-14 DIAGNOSIS — Z7982 Long term (current) use of aspirin: Secondary | ICD-10-CM | POA: Insufficient documentation

## 2018-03-14 DIAGNOSIS — M5137 Other intervertebral disc degeneration, lumbosacral region: Secondary | ICD-10-CM | POA: Insufficient documentation

## 2018-03-14 DIAGNOSIS — S63591S Other specified sprain of right wrist, sequela: Secondary | ICD-10-CM

## 2018-03-14 DIAGNOSIS — D751 Secondary polycythemia: Secondary | ICD-10-CM | POA: Insufficient documentation

## 2018-03-14 LAB — CBC WITH DIFFERENTIAL (CANCER CENTER ONLY)
Abs Immature Granulocytes: 0.03 10*3/uL (ref 0.00–0.07)
BASOS PCT: 1 %
Basophils Absolute: 0 10*3/uL (ref 0.0–0.1)
EOS ABS: 0.3 10*3/uL (ref 0.0–0.5)
EOS PCT: 5 %
HEMATOCRIT: 41.7 % (ref 39.0–52.0)
Hemoglobin: 13.3 g/dL (ref 13.0–17.0)
IMMATURE GRANULOCYTES: 1 %
LYMPHS ABS: 1.5 10*3/uL (ref 0.7–4.0)
Lymphocytes Relative: 27 %
MCH: 28.3 pg (ref 26.0–34.0)
MCHC: 31.9 g/dL (ref 30.0–36.0)
MCV: 88.7 fL (ref 80.0–100.0)
Monocytes Absolute: 0.4 10*3/uL (ref 0.1–1.0)
Monocytes Relative: 7 %
Neutro Abs: 3.3 10*3/uL (ref 1.7–7.7)
Neutrophils Relative %: 59 %
PLATELETS: 226 10*3/uL (ref 150–400)
RBC: 4.7 MIL/uL (ref 4.22–5.81)
RDW: 13.9 % (ref 11.5–15.5)
WBC Count: 5.5 10*3/uL (ref 4.0–10.5)
nRBC: 0 % (ref 0.0–0.2)

## 2018-03-14 LAB — CMP (CANCER CENTER ONLY)
ALT: 38 U/L (ref 10–47)
ANION GAP: 2 — AB (ref 5–15)
AST: 27 U/L (ref 11–38)
Albumin: 3.4 g/dL — ABNORMAL LOW (ref 3.5–5.0)
Alkaline Phosphatase: 61 U/L (ref 26–84)
BILIRUBIN TOTAL: 0.5 mg/dL (ref 0.2–1.6)
BUN: 21 mg/dL (ref 7–22)
CHLORIDE: 110 mmol/L — AB (ref 98–108)
CO2: 29 mmol/L (ref 18–33)
Calcium: 9.3 mg/dL (ref 8.0–10.3)
Creatinine: 1.6 mg/dL — ABNORMAL HIGH (ref 0.60–1.20)
GLUCOSE: 127 mg/dL — AB (ref 73–118)
Potassium: 4.2 mmol/L (ref 3.3–4.7)
Sodium: 141 mmol/L (ref 128–145)
Total Protein: 6.7 g/dL (ref 6.4–8.1)
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
## 2018-03-18 LAB — FERRITIN: Ferritin: 36 ng/mL (ref 24–336)

## 2018-03-18 LAB — IRON AND TIBC
Iron: 112 ug/dL (ref 42–163)
Saturation Ratios: 25 % — ABNORMAL LOW (ref 42–163)
TIBC: 448 ug/dL — ABNORMAL HIGH (ref 202–409)
UIBC: 336 ug/dL

## 2018-04-01 ENCOUNTER — Other Ambulatory Visit: Payer: Self-pay | Admitting: Hematology & Oncology

## 2018-04-01 DIAGNOSIS — S63591S Other specified sprain of right wrist, sequela: Secondary | ICD-10-CM

## 2018-04-01 DIAGNOSIS — M47817 Spondylosis without myelopathy or radiculopathy, lumbosacral region: Secondary | ICD-10-CM

## 2018-04-01 MED ORDER — MORPHINE SULFATE 30 MG PO TABS: ORAL_TABLET | ORAL | 0 refills | Status: DC

## 2018-04-01 MED ORDER — MORPHINE SULFATE ER 60 MG PO TBCR: 60.0000 mg | EXTENDED_RELEASE_TABLET | Freq: Two times a day (BID) | ORAL | 0 refills | Status: DC

## 2018-04-29 ENCOUNTER — Other Ambulatory Visit: Payer: Self-pay | Admitting: Hematology & Oncology

## 2018-04-29 MED ORDER — MORPHINE SULFATE ER 60 MG PO TBCR: 60.0000 mg | EXTENDED_RELEASE_TABLET | Freq: Two times a day (BID) | ORAL | 0 refills | Status: DC

## 2018-05-23 ENCOUNTER — Other Ambulatory Visit: Payer: Self-pay | Admitting: Hematology & Oncology

## 2018-05-27 ENCOUNTER — Other Ambulatory Visit: Payer: Self-pay | Admitting: Hematology & Oncology

## 2018-05-27 DIAGNOSIS — M47817 Spondylosis without myelopathy or radiculopathy, lumbosacral region: Secondary | ICD-10-CM

## 2018-05-27 DIAGNOSIS — S63591S Other specified sprain of right wrist, sequela: Secondary | ICD-10-CM

## 2018-05-27 MED ORDER — MORPHINE SULFATE 30 MG PO TABS: ORAL_TABLET | ORAL | 0 refills | Status: DC

## 2018-05-27 MED ORDER — MORPHINE SULFATE ER 60 MG PO TBCR: 60.0000 mg | EXTENDED_RELEASE_TABLET | Freq: Two times a day (BID) | ORAL | 0 refills | Status: DC

## 2018-06-07 ENCOUNTER — Telehealth: Payer: Self-pay | Admitting: Hematology & Oncology

## 2018-06-07 NOTE — Telephone Encounter (Signed)
MEDICAL RECORDS REQUEST  RECORDS FAXED TO:  [place] FAX # [phone]  REQUEST INDEXED

## 2018-06-13 ENCOUNTER — Inpatient Hospital Stay: Payer: PRIVATE HEALTH INSURANCE | Attending: Hematology & Oncology | Admitting: Hematology & Oncology

## 2018-06-13 ENCOUNTER — Inpatient Hospital Stay: Payer: PRIVATE HEALTH INSURANCE

## 2018-06-13 ENCOUNTER — Encounter: Payer: Self-pay | Admitting: Hematology & Oncology

## 2018-06-13 ENCOUNTER — Other Ambulatory Visit: Payer: Self-pay

## 2018-06-13 VITALS — BP 125/70 | HR 61 | Temp 98.4°F | Resp 20 | Wt 270.0 lb

## 2018-06-13 DIAGNOSIS — G8929 Other chronic pain: Secondary | ICD-10-CM | POA: Insufficient documentation

## 2018-06-13 DIAGNOSIS — D45 Polycythemia vera: Secondary | ICD-10-CM

## 2018-06-13 DIAGNOSIS — Z7982 Long term (current) use of aspirin: Secondary | ICD-10-CM | POA: Insufficient documentation

## 2018-06-13 DIAGNOSIS — R634 Abnormal weight loss: Secondary | ICD-10-CM

## 2018-06-13 DIAGNOSIS — Z981 Arthrodesis status: Secondary | ICD-10-CM | POA: Insufficient documentation

## 2018-06-13 DIAGNOSIS — M5137 Other intervertebral disc degeneration, lumbosacral region: Secondary | ICD-10-CM | POA: Insufficient documentation

## 2018-06-13 DIAGNOSIS — D751 Secondary polycythemia: Secondary | ICD-10-CM | POA: Insufficient documentation

## 2018-06-13 LAB — CMP (CANCER CENTER ONLY)
ALBUMIN: 3.9 g/dL (ref 3.5–5.0)
ALT: 20 U/L (ref 0–44)
AST: 15 U/L (ref 15–41)
Alkaline Phosphatase: 81 U/L (ref 38–126)
Anion gap: 8 (ref 5–15)
BUN: 16 mg/dL (ref 6–20)
CHLORIDE: 100 mmol/L (ref 98–111)
CO2: 30 mmol/L (ref 22–32)
Calcium: 9.1 mg/dL (ref 8.9–10.3)
Creatinine: 1.16 mg/dL (ref 0.61–1.24)
GFR, Est AFR Am: >60 mL/min (ref >60)
GFR, Estimated: >60 mL/min (ref >60)
GLUCOSE: 104 mg/dL — AB (ref 70–99)
POTASSIUM: 4.1 mmol/L (ref 3.5–5.1)
Sodium: 138 mmol/L (ref 135–145)
Total Bilirubin: 0.4 mg/dL (ref 0.3–1.2)
Total Protein: 6.6 g/dL (ref 6.5–8.1)

## 2018-06-13 LAB — IRON AND TIBC
Iron: 83 ug/dL (ref 42–163)
SATURATION RATIOS: 18 % — AB (ref 20–55)
TIBC: 469 ug/dL — AB (ref 202–409)
UIBC: 386 ug/dL — ABNORMAL HIGH (ref 117–376)

## 2018-06-13 LAB — CBC WITH DIFFERENTIAL (CANCER CENTER ONLY)
Abs Immature Granulocytes: 0.03 10*3/uL (ref 0.00–0.07)
BASOS PCT: 1 %
Basophils Absolute: 0.1 10*3/uL (ref 0.0–0.1)
EOS ABS: 0.9 10*3/uL — AB (ref 0.0–0.5)
Eosinophils Relative: 14 %
HCT: 44.2 % (ref 39.0–52.0)
Hemoglobin: 14.1 g/dL (ref 13.0–17.0)
IMMATURE GRANULOCYTES: 1 %
LYMPHS ABS: 1.6 10*3/uL (ref 0.7–4.0)
Lymphocytes Relative: 25 %
MCH: 28.2 pg (ref 26.0–34.0)
MCHC: 31.9 g/dL (ref 30.0–36.0)
MCV: 88.4 fL (ref 80.0–100.0)
Monocytes Absolute: 0.6 10*3/uL (ref 0.1–1.0)
Monocytes Relative: 9 %
NRBC: 0 % (ref 0.0–0.2)
Neutro Abs: 3.4 10*3/uL (ref 1.7–7.7)
Neutrophils Relative %: 50 %
PLATELETS: 198 10*3/uL (ref 150–400)
RBC: 5 MIL/uL (ref 4.22–5.81)
RDW: 12.9 % (ref 11.5–15.5)
WBC Count: 6.6 10*3/uL (ref 4.0–10.5)

## 2018-06-13 LAB — RETICULOCYTES
Immature Retic Fract: 11.5 % (ref 2.3–15.9)
RBC.: 5 MIL/uL (ref 4.22–5.81)
RETIC COUNT ABSOLUTE: 64.5 10*3/uL (ref 19.0–186.0)
Retic Ct Pct: 1.3 % (ref 0.4–3.1)

## 2018-06-13 LAB — FERRITIN: Ferritin: 9 ng/mL — ABNORMAL LOW (ref 24–336)

## 2018-06-13 LAB — LACTATE DEHYDROGENASE: LDH: 184 U/L (ref 98–192)
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
## 2018-06-26 ENCOUNTER — Other Ambulatory Visit: Payer: Self-pay | Admitting: Hematology & Oncology

## 2018-06-26 DIAGNOSIS — M47817 Spondylosis without myelopathy or radiculopathy, lumbosacral region: Secondary | ICD-10-CM

## 2018-06-26 DIAGNOSIS — S63591S Other specified sprain of right wrist, sequela: Secondary | ICD-10-CM

## 2018-06-26 MED ORDER — MORPHINE SULFATE 30 MG PO TABS: ORAL_TABLET | ORAL | 0 refills | Status: DC

## 2018-06-26 MED ORDER — MORPHINE SULFATE ER 60 MG PO TBCR: 60.0000 mg | EXTENDED_RELEASE_TABLET | Freq: Two times a day (BID) | ORAL | 0 refills | Status: DC

## 2018-07-25 ENCOUNTER — Other Ambulatory Visit: Payer: Self-pay | Admitting: Hematology & Oncology

## 2018-07-25 DIAGNOSIS — S63591S Other specified sprain of right wrist, sequela: Secondary | ICD-10-CM

## 2018-07-25 DIAGNOSIS — M47817 Spondylosis without myelopathy or radiculopathy, lumbosacral region: Secondary | ICD-10-CM

## 2018-07-25 MED ORDER — MORPHINE SULFATE 30 MG PO TABS: ORAL_TABLET | ORAL | 0 refills | Status: DC

## 2018-07-25 MED ORDER — MORPHINE SULFATE ER 60 MG PO TBCR: 60.0000 mg | EXTENDED_RELEASE_TABLET | Freq: Two times a day (BID) | ORAL | 0 refills | Status: DC

## 2018-08-25 ENCOUNTER — Other Ambulatory Visit: Payer: Self-pay | Admitting: Hematology & Oncology

## 2018-08-25 DIAGNOSIS — M47817 Spondylosis without myelopathy or radiculopathy, lumbosacral region: Secondary | ICD-10-CM

## 2018-08-25 DIAGNOSIS — S63591S Other specified sprain of right wrist, sequela: Secondary | ICD-10-CM

## 2018-08-25 MED ORDER — MORPHINE SULFATE 30 MG PO TABS: ORAL_TABLET | ORAL | 0 refills | Status: DC

## 2018-08-25 MED ORDER — MORPHINE SULFATE ER 60 MG PO TBCR: 60.0000 mg | EXTENDED_RELEASE_TABLET | Freq: Two times a day (BID) | ORAL | 0 refills | Status: DC

## 2018-08-29 ENCOUNTER — Inpatient Hospital Stay: Payer: Self-pay

## 2018-08-29 ENCOUNTER — Inpatient Hospital Stay: Payer: Self-pay | Attending: Hematology & Oncology | Admitting: Hematology & Oncology

## 2018-08-29 ENCOUNTER — Inpatient Hospital Stay: Payer: Medicare Other

## 2018-08-29 ENCOUNTER — Other Ambulatory Visit: Payer: Self-pay

## 2018-08-29 VITALS — BP 129/77 | HR 83 | Temp 98.1°F | Resp 19 | Wt 272.5 lb

## 2018-08-29 VITALS — BP 108/73 | HR 72

## 2018-08-29 DIAGNOSIS — Z7982 Long term (current) use of aspirin: Secondary | ICD-10-CM

## 2018-08-29 DIAGNOSIS — D45 Polycythemia vera: Secondary | ICD-10-CM

## 2018-08-29 DIAGNOSIS — D751 Secondary polycythemia: Secondary | ICD-10-CM | POA: Insufficient documentation

## 2018-08-29 LAB — CMP (CANCER CENTER ONLY)
ALT: 27 U/L (ref 0–44)
AST: 23 U/L (ref 15–41)
Albumin: 3.8 g/dL (ref 3.5–5.0)
Alkaline Phosphatase: 83 U/L (ref 38–126)
Anion gap: 9 (ref 5–15)
BUN: 9 mg/dL (ref 6–20)
CO2: 28 mmol/L (ref 22–32)
Calcium: 8.4 mg/dL — ABNORMAL LOW (ref 8.9–10.3)
Chloride: 101 mmol/L (ref 98–111)
Creatinine: 0.99 mg/dL (ref 0.61–1.24)
GFR, Est AFR Am: >60 mL/min
GFR, Estimated: >60 mL/min
Glucose, Bld: 121 mg/dL — ABNORMAL HIGH (ref 70–99)
Potassium: 3.8 mmol/L (ref 3.5–5.1)
Sodium: 138 mmol/L (ref 135–145)
Total Bilirubin: 0.4 mg/dL (ref 0.3–1.2)
Total Protein: 6.4 g/dL — ABNORMAL LOW (ref 6.5–8.1)

## 2018-08-29 LAB — CBC WITH DIFFERENTIAL (CANCER CENTER ONLY)
Abs Immature Granulocytes: 0.04 10*3/uL (ref 0.00–0.07)
BASOS PCT: 1 %
Basophils Absolute: 0.1 10*3/uL (ref 0.0–0.1)
Eosinophils Absolute: 0.4 10*3/uL (ref 0.0–0.5)
Eosinophils Relative: 6 %
HCT: 45.8 % (ref 39.0–52.0)
Hemoglobin: 14.2 g/dL (ref 13.0–17.0)
Immature Granulocytes: 1 %
Lymphocytes Relative: 23 %
Lymphs Abs: 1.4 10*3/uL (ref 0.7–4.0)
MCH: 27.3 pg (ref 26.0–34.0)
MCHC: 31 g/dL (ref 30.0–36.0)
MCV: 88.1 fL (ref 80.0–100.0)
Monocytes Absolute: 0.6 10*3/uL (ref 0.1–1.0)
Monocytes Relative: 10 %
Neutro Abs: 3.8 10*3/uL (ref 1.7–7.7)
Neutrophils Relative %: 59 %
Platelet Count: 160 10*3/uL (ref 150–400)
RBC: 5.2 MIL/uL (ref 4.22–5.81)
RDW: 15.4 % (ref 11.5–15.5)
WBC Count: 6.3 10*3/uL (ref 4.0–10.5)
nRBC: 0 % (ref 0.0–0.2)

## 2018-08-29 NOTE — Progress Notes (Signed)
Hematology and Oncology Follow Up Visit  Jacob Bradford [id number] Oct 13, 1960 58 y.o. 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  He does feel a little tired.  I suspect that his blood count is probably going to be up and he will need to be phlebotomized.  He, like every else, has been hampered by the coronavirus.  Is really not able to go anywhere.  He is not able to work.  Actually, his disability did come in so that will help him out.  He has had no bleeding.  He is not smoking.  His weight is holding steady which is a big positive for him.  There is been no nausea or vomiting.  He has had no change in bowel or bladder habits.  There is been no leg swelling.  He has had no rashes.  There is no cough or shortness of breath.  Overall, his performance status is ECOG 1.  Medications:  Allergies as of 08/29/2018      Reactions   No Allergies On File    Other       Medication List       Accurate as of August 29, 2018 10:43 AM. Always use your most recent med list.        acetaminophen 500 MG tablet Commonly known as:  TYLENOL Take 1,000 mg by mouth every 6 (six) hours as needed for moderate pain.   ALPRAZolam 1 MG tablet Commonly known as:  XANAX Take 1 tablet (1 mg total) by mouth 3 (three) times daily as needed for anxiety.   aspirin 81 MG tablet Take 1 tablet (81 mg total) by mouth daily.   atorvastatin 10 MG tablet Commonly known as:  LIPITOR Take 10 mg by mouth daily.   esomeprazole 40 MG capsule Commonly known as:  NEXIUM Take 40 mg by mouth daily at 12 noon.   Fish Oil 1000 MG Caps Take 1,000 mg by mouth at  bedtime.   lisinopril 40 MG tablet Commonly known as:  PRINIVIL,ZESTRIL Take 40 mg by mouth daily.   morphine 60 MG 12 hr tablet Commonly known as:  MS CONTIN Take 1 tablet (60 mg total) by mouth every 12 (twelve) hours.   morphine 30 MG tablet Commonly known as:  MSIR Take 1-2 pills, as needed, for pain every 6 hours.   polyethylene glycol packet Commonly known as:  MIRALAX / GLYCOLAX Take 17 g by mouth daily as needed for moderate constipation.       Allergies:  Allergies  Allergen Reactions  . No Allergies On File   . Other     Past Medical History, Surgical history, Social history, and Family History were reviewed and updated.  Review of Systems: Review of Systems  Constitutional: Negative.   HENT: Negative.   Eyes: Negative.   Respiratory: Negative.   Cardiovascular: Negative.   Gastrointestinal: Negative.   Genitourinary: Negative.   Musculoskeletal: Positive for back pain and neck pain.  Skin: Negative.   Neurological: Negative.   Endo/Heme/Allergies: Negative.   Psychiatric/Behavioral: Negative.      Physical Exam:  weight is 272 lb 8 oz (123.6 kg). His  oral temperature is 98.1 F (36.7 C). His blood pressure is 129/77 and his pulse is 83. His respiration is 19 and oxygen saturation is 98%.   Wt Readings from Last 3 Encounters:  08/29/18 272 lb 8 oz (123.6 kg)  06/13/18 270 lb (122.5 kg)  03/14/18 263 lb (119.3 kg)    Physical Exam Vitals signs reviewed.  HENT:     Head: Normocephalic and atraumatic.  Eyes:     Pupils: Pupils are equal, round, and reactive to light.  Neck:     Musculoskeletal: Normal range of motion.  Cardiovascular:     Rate and Rhythm: Normal rate and regular rhythm.     Heart sounds: Normal heart sounds.  Pulmonary:     Effort: Pulmonary effort is normal.     Breath sounds: Normal breath sounds.  Abdominal:     General: Bowel sounds are normal.     Palpations: Abdomen is soft.  Musculoskeletal: Normal range of  motion.        General: No tenderness or deformity.  Lymphadenopathy:     Cervical: No cervical adenopathy.  Skin:    General: Skin is warm and dry.     Findings: No erythema or rash.  Neurological:     Mental Status: He is alert and oriented to person, place, and time.  Psychiatric:        Behavior: Behavior normal.        Thought Content: Thought content normal.        Judgment: Judgment normal.      Lab Results  Component Value Date   WBC 6.3 08/29/2018   HGB 14.2 08/29/2018   HCT 45.8 08/29/2018   MCV 88.1 08/29/2018   PLT 160 08/29/2018   Lab Results  Component Value Date   FERRITIN 9 (L) 06/13/2018   IRON 83 06/13/2018   TIBC 469 (H) 06/13/2018   UIBC 386 (H) 06/13/2018   IRONPCTSAT 18 (L) 06/13/2018   Lab Results  Component Value Date   RETICCTPCT 1.3 06/13/2018   RBC 5.20 08/29/2018   RETICCTABS 77.0 02/22/2011   No results found for: KPAFRELGTCHN, LAMBDASER, KAPLAMBRATIO No results found for: Kandis Cocking, IGMSERUM No results found for: Odetta Pink, SPEI   Chemistry      Component Value Date/Time   NA 138 08/29/2018 0944   NA 141 05/09/2017 0910   K 3.8 08/29/2018 0944   K 4.2 05/09/2017 0910   CL 101 08/29/2018 0944   CL 105 11/13/2016 1123   CO2 28 08/29/2018 0944   CO2 24 05/09/2017 0910   BUN 9 08/29/2018 0944   BUN 15.2 05/09/2017 0910   CREATININE 0.99 08/29/2018 0944   CREATININE 1.2 05/09/2017 0910      Component Value Date/Time   CALCIUM 8.4 (L) 08/29/2018 0944   CALCIUM 9.1 05/09/2017 0910   ALKPHOS 83 08/29/2018 0944   ALKPHOS 96 05/09/2017 0910   AST 23 08/29/2018 0944   AST 22 05/09/2017 0910   ALT 27 08/29/2018 0944   ALT 30 05/09/2017 0910   BILITOT 0.4 08/29/2018 0944   BILITOT 0.48 05/09/2017 0910      Impression and Plan: Jacob Bradford is a very pleasant 58 yo caucasian gentleman with secondary polycythemia.  We will definitely phlebotomize him today.  I want to  make sure that his hematocrit is below 45%.  We will get him back in 3 months now.  I think this is very reasonable.  With phlebotomy, we  can move his appointments out a little bit longer.  Volanda Napoleon, MD 3/27/202010:43 AM

## 2018-08-29 NOTE — Progress Notes (Signed)
Jacob Bradford presents today for phlebotomy per MD orders. Phlebotomy procedure started at 1110 per Hilario Quarry RN with 16 g catheter.and ended at 1115. 526 grams removed. Patient observed for 30 minutes after procedure without any incident. Patient tolerated procedure well. IV needle removed intact.

## 2018-08-29 NOTE — Patient Instructions (Signed)
Therapeutic Phlebotomy Therapeutic phlebotomy is the planned removal of blood from a person's body for the purpose of treating a medical condition. The procedure is similar to donating blood. Usually, about a pint (470 mL, or 0.47 L) of blood is removed. The average adult has 9-12 pints (4.3-5.7 L) of blood in the body. Therapeutic phlebotomy may be used to treat the following medical conditions:  Hemochromatosis. This is a condition in which the blood contains too much iron.  Polycythemia vera. This is a condition in which the blood contains too many red blood cells.  Porphyria cutanea tarda. This is a disease in which an important part of hemoglobin is not made properly. It results in the buildup of abnormal amounts of porphyrins in the body.  Sickle cell disease. This is a condition in which the red blood cells form an abnormal crescent shape rather than a round shape. Tell a health care provider about:  Any allergies you have.  All medicines you are taking, including vitamins, herbs, eye drops, creams, and over-the-counter medicines.  Any problems you or family members have had with anesthetic medicines.  Any blood disorders you have.  Any surgeries you have had.  Any medical conditions you have.  Whether you are pregnant or may be pregnant. What are the risks? Generally, this is a safe procedure. However, problems may occur, including:  Nausea or light-headedness.  Low blood pressure (hypotension).  Soreness, bleeding, swelling, or bruising at the needle insertion site.  Infection. What happens before the procedure?  Follow instructions from your health care provider about eating or drinking restrictions.  Ask your health care provider about: ? Changing or stopping your regular medicines. This is especially important if you are taking diabetes medicines or blood thinners (anticoagulants). ? Taking medicines such as aspirin and ibuprofen. These medicines can thin your  blood. Do not take these medicines unless your health care provider tells you to take them. ? Taking over-the-counter medicines, vitamins, herbs, and supplements.  Wear clothing with sleeves that can be raised above the elbow.  Plan to have someone take you home from the hospital or clinic.  You may have a blood sample taken.  Your blood pressure, pulse rate, and breathing rate will be measured. What happens during the procedure?   To lower your risk of infection: ? Your health care team will wash or sanitize their hands. ? Your skin will be cleaned with an antiseptic.  You may be given a medicine to numb the area (local anesthetic).  A tourniquet will be placed on your arm.  A needle will be inserted into one of your veins.  Tubing and a collection bag will be attached to that needle.  Blood will flow through the needle and tubing into the collection bag.  The collection bag will be placed lower than your arm to allow gravity to help the flow of blood into the bag.  You may be asked to open and close your hand slowly and continually during the entire collection.  After the specified amount of blood has been removed from your body, the collection bag and tubing will be clamped.  The needle will be removed from your vein.  Pressure will be held on the site of the needle insertion to stop the bleeding.  A bandage (dressing) will be placed over the needle insertion site. The procedure may vary among health care providers and hospitals. What happens after the procedure?  Your blood pressure, pulse rate, and breathing rate will be   measured after the procedure.  You will be encouraged to drink fluids.  Your recovery will be assessed and monitored.  You can return to your normal activities as told by your health care provider. Summary  Therapeutic phlebotomy is the planned removal of blood from a person's body for the purpose of treating a medical condition.  Therapeutic  phlebotomy may be used to treat hemochromatosis, polycythemia vera, porphyria cutanea tarda, or sickle cell disease.  In the procedure, a needle is inserted and about a pint (470 mL, or 0.47 L) of blood is removed. The average adult has 9-12 pints (4.3-5.7 L) of blood in the body.  This is generally a safe procedure, but it can sometimes cause problems such as nausea, light-headedness, or low blood pressure (hypotension). This information is not intended to replace advice given to you by your health care provider. Make sure you discuss any questions you have with your health care provider. Document Released: 10/23/2010 Document Revised: 06/06/2017 Document Reviewed: 06/06/2017 Elsevier Interactive Patient Education  2019 [employer]  

## 2018-09-01 LAB — FERRITIN: Ferritin: 11 ng/mL — ABNORMAL LOW (ref 24–336)

## 2018-09-01 LAB — IRON AND TIBC
Iron: 109 ug/dL (ref 42–163)
Saturation Ratios: 24 % (ref 20–55)
TIBC: 454 ug/dL — AB (ref 202–409)
UIBC: 345 ug/dL (ref 117–376)

## 2018-09-24 ENCOUNTER — Other Ambulatory Visit: Payer: Self-pay | Admitting: Hematology & Oncology

## 2018-09-24 DIAGNOSIS — S63591S Other specified sprain of right wrist, sequela: Secondary | ICD-10-CM

## 2018-09-24 DIAGNOSIS — M47817 Spondylosis without myelopathy or radiculopathy, lumbosacral region: Secondary | ICD-10-CM

## 2018-09-24 MED ORDER — MORPHINE SULFATE ER 60 MG PO TBCR: 60.0000 mg | EXTENDED_RELEASE_TABLET | Freq: Two times a day (BID) | ORAL | 0 refills | Status: DC

## 2018-09-24 MED ORDER — MORPHINE SULFATE 30 MG PO TABS: ORAL_TABLET | ORAL | 0 refills | Status: DC

## 2018-10-22 ENCOUNTER — Other Ambulatory Visit: Payer: Self-pay | Admitting: Hematology & Oncology

## 2018-10-22 DIAGNOSIS — S63591S Other specified sprain of right wrist, sequela: Secondary | ICD-10-CM

## 2018-10-22 DIAGNOSIS — M47817 Spondylosis without myelopathy or radiculopathy, lumbosacral region: Secondary | ICD-10-CM

## 2018-10-22 MED ORDER — MORPHINE SULFATE 30 MG PO TABS: ORAL_TABLET | ORAL | 0 refills | Status: DC

## 2018-10-22 MED ORDER — MORPHINE SULFATE ER 60 MG PO TBCR: 60.0000 mg | EXTENDED_RELEASE_TABLET | Freq: Two times a day (BID) | ORAL | 0 refills | Status: DC

## 2018-11-21 ENCOUNTER — Other Ambulatory Visit: Payer: Self-pay | Admitting: Hematology & Oncology

## 2018-11-21 DIAGNOSIS — M47817 Spondylosis without myelopathy or radiculopathy, lumbosacral region: Secondary | ICD-10-CM

## 2018-11-21 DIAGNOSIS — S63591S Other specified sprain of right wrist, sequela: Secondary | ICD-10-CM

## 2018-11-21 MED ORDER — MORPHINE SULFATE ER 60 MG PO TBCR: 60.0000 mg | EXTENDED_RELEASE_TABLET | Freq: Two times a day (BID) | ORAL | 0 refills | Status: DC

## 2018-11-21 MED ORDER — MORPHINE SULFATE 30 MG PO TABS: ORAL_TABLET | ORAL | 0 refills | Status: DC

## 2018-11-28 ENCOUNTER — Other Ambulatory Visit: Payer: Self-pay

## 2018-11-28 ENCOUNTER — Inpatient Hospital Stay: Payer: Medicare Other

## 2018-11-28 ENCOUNTER — Encounter: Payer: Self-pay | Admitting: Hematology & Oncology

## 2018-11-28 ENCOUNTER — Inpatient Hospital Stay: Payer: Medicare Other | Attending: Hematology & Oncology | Admitting: Hematology & Oncology

## 2018-11-28 VITALS — BP 119/93 | HR 85 | Resp 18

## 2018-11-28 VITALS — BP 148/98 | HR 84 | Temp 97.6°F | Resp 16 | Wt 261.6 lb

## 2018-11-28 DIAGNOSIS — D751 Secondary polycythemia: Secondary | ICD-10-CM | POA: Diagnosis not present

## 2018-11-28 DIAGNOSIS — Z7982 Long term (current) use of aspirin: Secondary | ICD-10-CM | POA: Diagnosis not present

## 2018-11-28 DIAGNOSIS — M549 Dorsalgia, unspecified: Secondary | ICD-10-CM

## 2018-11-28 DIAGNOSIS — D45 Polycythemia vera: Secondary | ICD-10-CM

## 2018-11-28 DIAGNOSIS — G8929 Other chronic pain: Secondary | ICD-10-CM

## 2018-11-28 LAB — CMP (CANCER CENTER ONLY)
ALT: 27 U/L (ref 0–44)
AST: 19 U/L (ref 15–41)
Albumin: 4 g/dL (ref 3.5–5.0)
Alkaline Phosphatase: 80 U/L (ref 38–126)
Anion gap: 8 (ref 5–15)
BUN: 16 mg/dL (ref 6–20)
CO2: 27 mmol/L (ref 22–32)
Calcium: 9.2 mg/dL (ref 8.9–10.3)
Chloride: 103 mmol/L (ref 98–111)
Creatinine: 1.01 mg/dL (ref 0.61–1.24)
GFR, Est AFR Am: >60 mL/min (ref >60)
GFR, Estimated: >60 mL/min (ref >60)
Glucose, Bld: 134 mg/dL — ABNORMAL HIGH (ref 70–99)
Potassium: 4.3 mmol/L (ref 3.5–5.1)
Sodium: 138 mmol/L (ref 135–145)
Total Bilirubin: 0.4 mg/dL (ref 0.3–1.2)
Total Protein: 6.9 g/dL (ref 6.5–8.1)

## 2018-11-28 LAB — IRON AND TIBC
Iron: 62 ug/dL (ref 42–163)
Saturation Ratios: 13 % — ABNORMAL LOW (ref 20–55)
TIBC: 461 ug/dL — ABNORMAL HIGH (ref 202–409)
UIBC: 399 ug/dL — ABNORMAL HIGH (ref 117–376)

## 2018-11-28 LAB — CBC WITH DIFFERENTIAL (CANCER CENTER ONLY)
Abs Immature Granulocytes: 0.02 10*3/uL (ref 0.00–0.07)
Basophils Absolute: 0.1 10*3/uL (ref 0.0–0.1)
Basophils Relative: 1 %
Eosinophils Absolute: 0.4 10*3/uL (ref 0.0–0.5)
Eosinophils Relative: 5 %
HCT: 49.7 % (ref 39.0–52.0)
Hemoglobin: 15.8 g/dL (ref 13.0–17.0)
Immature Granulocytes: 0 %
Lymphocytes Relative: 16 %
Lymphs Abs: 1.1 10*3/uL (ref 0.7–4.0)
MCH: 27.8 pg (ref 26.0–34.0)
MCHC: 31.8 g/dL (ref 30.0–36.0)
MCV: 87.5 fL (ref 80.0–100.0)
Monocytes Absolute: 0.6 10*3/uL (ref 0.1–1.0)
Monocytes Relative: 9 %
Neutro Abs: 4.9 10*3/uL (ref 1.7–7.7)
Neutrophils Relative %: 69 %
Platelet Count: 163 10*3/uL (ref 150–400)
RBC: 5.68 MIL/uL (ref 4.22–5.81)
RDW: 14.4 % (ref 11.5–15.5)
WBC Count: 7.1 10*3/uL (ref 4.0–10.5)
nRBC: 0 % (ref 0.0–0.2)

## 2018-11-28 LAB — FERRITIN: Ferritin: 23 ng/mL — ABNORMAL LOW (ref 24–336)

## 2018-11-28 NOTE — Progress Notes (Signed)
Hematology and Oncology Follow Up Visit  Jacob Bradford [id number] [birth year] 58 y.o. 11/28/2018   Principle Diagnosis:  Polycythemia vera - JAK2 negative Chronic low back pain secondary to degenerative disc disease - status post spinal fusion at L4-5 and L5-S1 Traumatic ligament damage to the right thumb and right knee  Current Therapy:   Phlebotomy to maintain hematocrit below 45% Aspirin 81 mg by mouth daily   Interim History:  Jacob Bradford is here today for follow-up.  He is doing pretty well.  He really has no specific complaints.  He is staying home because of the coronavirus.  His hematocrit today is 49.8%.  We will definitely have to phlebotomize him today.  He has had no headache.  There is no problems with nausea or vomiting.  His back is doing okay.  He is on pain medication for this.  This is allowed him to be functional.  He is a little bit upset over how the world is going right now.  We did talk about this quite a bit.  He has had no fever.  There is been no leg swelling.  He has had no rashes.  Overall, his performance status is ECOG 1.  Medications:  Allergies as of 11/28/2018      Reactions   No Allergies On File    Other       Medication List       Accurate as of November 28, 2018 11:36 AM. If you have any questions, ask your nurse or doctor.        acetaminophen 500 MG tablet Commonly known as: TYLENOL Take 1,000 mg by mouth every 6 (six) hours as needed for moderate pain.   ALPRAZolam 1 MG tablet Commonly known as: XANAX Take 1 tablet (1 mg total) by mouth 3 (three) times daily as needed for anxiety. What changed: how much to take   aspirin 81 MG tablet Take 1 tablet (81 mg total) by mouth daily.   atorvastatin 10 MG tablet Commonly known as: LIPITOR Take 10 mg by mouth daily.   esomeprazole 40 MG capsule Commonly known as: NEXIUM Take 40 mg by mouth daily at 12 noon.   Fish Oil 1000 MG Caps Take 1,000 mg by mouth at bedtime.    lisinopril 40 MG tablet Commonly known as: ZESTRIL Take 40 mg by mouth daily.   morphine 60 MG 12 hr tablet Commonly known as: MS CONTIN Take 1 tablet (60 mg total) by mouth every 12 (twelve) hours.   morphine 30 MG tablet Commonly known as: MSIR Take 1-2 pills, as needed, for pain every 6 hours.   polyethylene glycol 17 g packet Commonly known as: MIRALAX / GLYCOLAX Take 17 g by mouth daily as needed for moderate constipation.       Allergies:  Allergies  Allergen Reactions  . No Allergies On File   . Other     Past Medical History, Surgical history, Social history, and Family History were reviewed and updated.  Review of Systems: Review of Systems  Constitutional: Negative.   HENT: Negative.   Eyes: Negative.   Respiratory: Negative.   Cardiovascular: Negative.   Gastrointestinal: Negative.   Genitourinary: Negative.   Musculoskeletal: Positive for back pain and neck pain.  Skin: Negative.   Neurological: Negative.   Endo/Heme/Allergies: Negative.   Psychiatric/Behavioral: Negative.      Physical Exam:  weight is 261 lb 9.6 oz (118.7 kg). His oral temperature is 97.6 F (36.4 C). His blood pressure is  148/98 (abnormal) and his pulse is 84. His respiration is 16 and oxygen saturation is 97%.   Wt Readings from Last 3 Encounters:  11/28/18 261 lb 9.6 oz (118.7 kg)  08/29/18 272 lb 8 oz (123.6 kg)  06/13/18 270 lb (122.5 kg)    Physical Exam Vitals signs reviewed.  HENT:     Head: Normocephalic and atraumatic.  Eyes:     Pupils: Pupils are equal, round, and reactive to light.  Neck:     Musculoskeletal: Normal range of motion.  Cardiovascular:     Rate and Rhythm: Normal rate and regular rhythm.     Heart sounds: Normal heart sounds.  Pulmonary:     Effort: Pulmonary effort is normal.     Breath sounds: Normal breath sounds.  Abdominal:     General: Bowel sounds are normal.     Palpations: Abdomen is soft.  Musculoskeletal: Normal range of  motion.        General: No tenderness or deformity.  Lymphadenopathy:     Cervical: No cervical adenopathy.  Skin:    General: Skin is warm and dry.     Findings: No erythema or rash.  Neurological:     Mental Status: He is alert and oriented to person, place, and time.  Psychiatric:        Behavior: Behavior normal.        Thought Content: Thought content normal.        Judgment: Judgment normal.      Lab Results  Component Value Date   WBC 7.1 11/28/2018   HGB 15.8 11/28/2018   HCT 49.7 11/28/2018   MCV 87.5 11/28/2018   PLT 163 11/28/2018   Lab Results  Component Value Date   FERRITIN 11 (L) 08/29/2018   IRON 109 08/29/2018   TIBC 454 (H) 08/29/2018   UIBC 345 08/29/2018   IRONPCTSAT 24 08/29/2018   Lab Results  Component Value Date   RETICCTPCT 1.3 06/13/2018   RBC 5.68 11/28/2018   RETICCTABS 77.0 02/22/2011   No results found for: KPAFRELGTCHN, LAMBDASER, KAPLAMBRATIO No results found for: Kandis Cocking, IGMSERUM No results found for: Odetta Pink, SPEI   Chemistry      Component Value Date/Time   NA 138 11/28/2018 1010   NA 141 05/09/2017 0910   K 4.3 11/28/2018 1010   K 4.2 05/09/2017 0910   CL 103 11/28/2018 1010   CL 105 11/13/2016 1123   CO2 27 11/28/2018 1010   CO2 24 05/09/2017 0910   BUN 16 11/28/2018 1010   BUN 15.2 05/09/2017 0910   CREATININE 1.01 11/28/2018 1010   CREATININE 1.2 05/09/2017 0910      Component Value Date/Time   CALCIUM 9.2 11/28/2018 1010   CALCIUM 9.1 05/09/2017 0910   ALKPHOS 80 11/28/2018 1010   ALKPHOS 96 05/09/2017 0910   AST 19 11/28/2018 1010   AST 22 05/09/2017 0910   ALT 27 11/28/2018 1010   ALT 30 05/09/2017 0910   BILITOT 0.4 11/28/2018 1010   BILITOT 0.48 05/09/2017 0910      Impression and Plan: Jacob Bradford is a very pleasant 58 yo caucasian gentleman with secondary polycythemia.  We will definitely phlebotomize him today.  I want to make sure  that his hematocrit is below 45%.  We will get him back in 6 weeks.  I want to make sure that we do a good job in getting his hematocrit down below 45% so he will not  have any thrombotic issues.  Volanda Napoleon, MD 6/26/202011:36 AM

## 2018-11-28 NOTE — Progress Notes (Signed)
Jacob Bradford presents today for phlebotomy per MD orders. Phlebotomy procedure started at 1148 and ended at 1152. 545 cc removed via 16 G needle at L antecubital site. Patient tolerated procedure well.

## 2018-11-28 NOTE — Patient Instructions (Signed)

## 2018-12-22 ENCOUNTER — Other Ambulatory Visit: Payer: Self-pay | Admitting: Hematology & Oncology

## 2018-12-22 DIAGNOSIS — M47817 Spondylosis without myelopathy or radiculopathy, lumbosacral region: Secondary | ICD-10-CM

## 2018-12-22 DIAGNOSIS — S63591S Other specified sprain of right wrist, sequela: Secondary | ICD-10-CM

## 2019-01-09 ENCOUNTER — Inpatient Hospital Stay: Payer: Medicare HMO

## 2019-01-09 ENCOUNTER — Inpatient Hospital Stay: Payer: Medicare HMO | Attending: Hematology & Oncology | Admitting: Hematology & Oncology

## 2019-01-19 ENCOUNTER — Other Ambulatory Visit: Payer: Self-pay | Admitting: Hematology & Oncology

## 2019-01-19 DIAGNOSIS — S63591S Other specified sprain of right wrist, sequela: Secondary | ICD-10-CM

## 2019-01-19 DIAGNOSIS — M47817 Spondylosis without myelopathy or radiculopathy, lumbosacral region: Secondary | ICD-10-CM

## 2019-01-19 MED ORDER — MORPHINE SULFATE ER 60 MG PO TBCR: 60.0000 mg | EXTENDED_RELEASE_TABLET | Freq: Two times a day (BID) | ORAL | 0 refills | Status: DC

## 2019-01-19 MED ORDER — MORPHINE SULFATE 30 MG PO TABS: ORAL_TABLET | ORAL | 0 refills | Status: DC

## 2019-02-20 ENCOUNTER — Other Ambulatory Visit: Payer: Self-pay | Admitting: Hematology & Oncology

## 2019-02-20 DIAGNOSIS — M47817 Spondylosis without myelopathy or radiculopathy, lumbosacral region: Secondary | ICD-10-CM

## 2019-02-20 DIAGNOSIS — S63591S Other specified sprain of right wrist, sequela: Secondary | ICD-10-CM

## 2019-02-20 MED ORDER — MORPHINE SULFATE 30 MG PO TABS: ORAL_TABLET | ORAL | 0 refills | Status: DC

## 2019-02-20 MED ORDER — MORPHINE SULFATE ER 60 MG PO TBCR: 60.0000 mg | EXTENDED_RELEASE_TABLET | Freq: Two times a day (BID) | ORAL | 0 refills | Status: DC

## 2019-03-18 ENCOUNTER — Encounter: Payer: Self-pay | Admitting: Hematology & Oncology

## 2019-03-18 ENCOUNTER — Other Ambulatory Visit: Payer: Self-pay

## 2019-03-18 ENCOUNTER — Inpatient Hospital Stay: Payer: Medicare HMO | Attending: Hematology & Oncology

## 2019-03-18 ENCOUNTER — Inpatient Hospital Stay: Payer: Medicare HMO

## 2019-03-18 ENCOUNTER — Inpatient Hospital Stay: Payer: Medicare HMO | Admitting: Hematology & Oncology

## 2019-03-18 VITALS — BP 173/97 | HR 100 | Temp 97.7°F | Resp 19 | Ht 72.0 in | Wt 258.4 lb

## 2019-03-18 VITALS — BP 127/83 | HR 85

## 2019-03-18 DIAGNOSIS — S63591S Other specified sprain of right wrist, sequela: Secondary | ICD-10-CM

## 2019-03-18 DIAGNOSIS — D751 Secondary polycythemia: Secondary | ICD-10-CM | POA: Insufficient documentation

## 2019-03-18 DIAGNOSIS — M47817 Spondylosis without myelopathy or radiculopathy, lumbosacral region: Secondary | ICD-10-CM

## 2019-03-18 DIAGNOSIS — D45 Polycythemia vera: Secondary | ICD-10-CM

## 2019-03-18 DIAGNOSIS — Z7982 Long term (current) use of aspirin: Secondary | ICD-10-CM | POA: Diagnosis not present

## 2019-03-18 DIAGNOSIS — M5136 Other intervertebral disc degeneration, lumbar region: Secondary | ICD-10-CM | POA: Insufficient documentation

## 2019-03-18 DIAGNOSIS — G8929 Other chronic pain: Secondary | ICD-10-CM | POA: Insufficient documentation

## 2019-03-18 LAB — CMP (CANCER CENTER ONLY)
ALT: 47 U/L — ABNORMAL HIGH (ref 0–44)
AST: 44 U/L — ABNORMAL HIGH (ref 15–41)
Albumin: 4.1 g/dL (ref 3.5–5.0)
Alkaline Phosphatase: 78 U/L (ref 38–126)
Anion gap: 13 (ref 5–15)
BUN: 9 mg/dL (ref 6–20)
CO2: 24 mmol/L (ref 22–32)
Calcium: 9.1 mg/dL (ref 8.9–10.3)
Chloride: 100 mmol/L (ref 98–111)
Creatinine: 1.14 mg/dL (ref 0.61–1.24)
GFR, Est AFR Am: >60 mL/min (ref >60)
GFR, Estimated: >60 mL/min (ref >60)
Glucose, Bld: 191 mg/dL — ABNORMAL HIGH (ref 70–99)
Potassium: 3.5 mmol/L (ref 3.5–5.1)
Sodium: 137 mmol/L (ref 135–145)
Total Bilirubin: 0.9 mg/dL (ref 0.3–1.2)
Total Protein: 7.1 g/dL (ref 6.5–8.1)

## 2019-03-18 LAB — CBC WITH DIFFERENTIAL (CANCER CENTER ONLY)
Abs Immature Granulocytes: 0.03 10*3/uL (ref 0.00–0.07)
Basophils Absolute: 0.1 10*3/uL (ref 0.0–0.1)
Basophils Relative: 1 %
Eosinophils Absolute: 0.1 10*3/uL (ref 0.0–0.5)
Eosinophils Relative: 1 %
HCT: 50.9 % (ref 39.0–52.0)
Hemoglobin: 16.1 g/dL (ref 13.0–17.0)
Immature Granulocytes: 0 %
Lymphocytes Relative: 13 %
Lymphs Abs: 1.1 10*3/uL (ref 0.7–4.0)
MCH: 27.3 pg (ref 26.0–34.0)
MCHC: 31.6 g/dL (ref 30.0–36.0)
MCV: 86.3 fL (ref 80.0–100.0)
Monocytes Absolute: 0.4 10*3/uL (ref 0.1–1.0)
Monocytes Relative: 5 %
Neutro Abs: 6.9 10*3/uL (ref 1.7–7.7)
Neutrophils Relative %: 80 %
Platelet Count: 218 10*3/uL (ref 150–400)
RBC: 5.9 MIL/uL — ABNORMAL HIGH (ref 4.22–5.81)
RDW: 13.8 % (ref 11.5–15.5)
WBC Count: 8.6 10*3/uL (ref 4.0–10.5)
nRBC: 0 % (ref 0.0–0.2)

## 2019-03-18 MED ORDER — MORPHINE SULFATE ER 60 MG PO TBCR: 60.0000 mg | EXTENDED_RELEASE_TABLET | Freq: Two times a day (BID) | ORAL | 0 refills | Status: DC

## 2019-03-18 MED ORDER — MORPHINE SULFATE 30 MG PO TABS: ORAL_TABLET | ORAL | 0 refills | Status: DC

## 2019-03-18 NOTE — Progress Notes (Signed)
Hematology and Oncology Follow Up Visit  Jacob Bradford [id number] [birth year] 58 y.o. 03/18/2019   Principle Diagnosis:  Polycythemia vera - JAK2 negative Chronic low back pain secondary to degenerative disc disease - status post spinal fusion at L4-5 and L5-S1 Traumatic ligament damage to the right thumb and right knee  Current Therapy:   Phlebotomy to maintain hematocrit below 45% Aspirin 81 mg by mouth daily   Interim History:  Jacob Bradford is here today for follow-up.  He is under a lot of stress right now.  Seems like there is some issues with his family.  He would not want to go into it.  I certainly understand this.  Otherwise he is doing okay.  He was last phlebotomized in June.  We are probably going to have to phlebotomize him again.  His hematocrit is 50.1%.  He is a little bit of a headache.  His joints seem to hurt him a little bit more.  This typically happens when he does get a little bit on the high side with his blood.  His appetite is been down.  Not sure why his appetite would be down.  He has had no nausea or vomiting.  There is been no change in bowel or bladder habits.  He has had no fever.  He has had no bleeding.  He has had no leg swelling.    He will see his family doctor in November.  Overall, I would say his performance status is ECOG 1.  Medications:  Allergies as of 03/18/2019   No Known Allergies     Medication List       Accurate as of March 18, 2019  2:38 PM. If you have any questions, ask your nurse or doctor.        acetaminophen 500 MG tablet Commonly known as: TYLENOL Take 1,000 mg by mouth every 6 (six) hours as needed for moderate pain.   ALPRAZolam 1 MG tablet Commonly known as: XANAX Take 1 tablet (1 mg total) by mouth 3 (three) times daily as needed for anxiety. What changed: how much to take   aspirin 81 MG tablet Take 1 tablet (81 mg total) by mouth daily.   atorvastatin 10 MG tablet Commonly known as: LIPITOR Take  10 mg by mouth daily.   esomeprazole 40 MG capsule Commonly known as: NEXIUM Take 40 mg by mouth daily at 12 noon.   Fish Oil 1000 MG Caps Take 1,000 mg by mouth at bedtime.   lisinopril 40 MG tablet Commonly known as: ZESTRIL Take 40 mg by mouth daily.   morphine 60 MG 12 hr tablet Commonly known as: MS CONTIN Take 1 tablet (60 mg total) by mouth every 12 (twelve) hours.   morphine 30 MG tablet Commonly known as: MSIR Take 1-2 if needed every 6 hours for pain   OPANA ER (CRUSH RESISTANT) 20 MG T12a Generic drug: Oxymorphone HCl (Crush Resist) Take 1 tablet by mouth every 12 (twelve) hours.   polyethylene glycol 17 g packet Commonly known as: MIRALAX / GLYCOLAX Take 17 g by mouth daily as needed for moderate constipation.       Allergies:  No Known Allergies  Past Medical History, Surgical history, Social history, and Family History were reviewed and updated.  Review of Systems: Review of Systems  Constitutional: Negative.   HENT: Negative.   Eyes: Negative.   Respiratory: Negative.   Cardiovascular: Negative.   Gastrointestinal: Negative.   Genitourinary: Negative.   Musculoskeletal: Positive for  back pain and neck pain.  Skin: Negative.   Neurological: Negative.   Endo/Heme/Allergies: Negative.   Psychiatric/Behavioral: Negative.      Physical Exam:  height is 6' (1.829 m) and weight is 258 lb 6.4 oz (117.2 kg). His temporal temperature is 97.7 F (36.5 C). His blood pressure is 173/97 (abnormal) and his pulse is 100. His respiration is 19 and oxygen saturation is 98%.   Wt Readings from Last 3 Encounters:  03/18/19 258 lb 6.4 oz (117.2 kg)  11/28/18 261 lb 9.6 oz (118.7 kg)  08/29/18 272 lb 8 oz (123.6 kg)    Physical Exam Vitals signs reviewed.  HENT:     Head: Normocephalic and atraumatic.  Eyes:     Pupils: Pupils are equal, round, and reactive to light.  Neck:     Musculoskeletal: Normal range of motion.  Cardiovascular:     Rate and  Rhythm: Normal rate and regular rhythm.     Heart sounds: Normal heart sounds.  Pulmonary:     Effort: Pulmonary effort is normal.     Breath sounds: Normal breath sounds.  Abdominal:     General: Bowel sounds are normal.     Palpations: Abdomen is soft.  Musculoskeletal: Normal range of motion.        General: No tenderness or deformity.  Lymphadenopathy:     Cervical: No cervical adenopathy.  Skin:    General: Skin is warm and dry.     Findings: No erythema or rash.  Neurological:     Mental Status: He is alert and oriented to person, place, and time.  Psychiatric:        Behavior: Behavior normal.        Thought Content: Thought content normal.        Judgment: Judgment normal.      Lab Results  Component Value Date   WBC 8.6 03/18/2019   HGB 16.1 03/18/2019   HCT 50.9 03/18/2019   MCV 86.3 03/18/2019   PLT 218 03/18/2019   Lab Results  Component Value Date   FERRITIN 23 (L) 11/28/2018   IRON 62 11/28/2018   TIBC 461 (H) 11/28/2018   UIBC 399 (H) 11/28/2018   IRONPCTSAT 13 (L) 11/28/2018   Lab Results  Component Value Date   RETICCTPCT 1.3 06/13/2018   RBC 5.90 (H) 03/18/2019   RETICCTABS 77.0 02/22/2011   No results found for: KPAFRELGTCHN, LAMBDASER, KAPLAMBRATIO No results found for: IGGSERUM, IGA, IGMSERUM No results found for: Kathrynn Ducking, MSPIKE, SPEI   Chemistry      Component Value Date/Time   NA 137 03/18/2019 1331   NA 141 05/09/2017 0910   K 3.5 03/18/2019 1331   K 4.2 05/09/2017 0910   CL 100 03/18/2019 1331   CL 105 11/13/2016 1123   CO2 24 03/18/2019 1331   CO2 24 05/09/2017 0910   BUN 9 03/18/2019 1331   BUN 15.2 05/09/2017 0910   CREATININE 1.14 03/18/2019 1331   CREATININE 1.2 05/09/2017 0910      Component Value Date/Time   CALCIUM 9.1 03/18/2019 1331   CALCIUM 9.1 05/09/2017 0910   ALKPHOS 78 03/18/2019 1331   ALKPHOS 96 05/09/2017 0910   AST 44 (H) 03/18/2019 1331   AST 22  05/09/2017 0910   ALT 47 (H) 03/18/2019 1331   ALT 30 05/09/2017 0910   BILITOT 0.9 03/18/2019 1331   BILITOT 0.48 05/09/2017 0910      Impression and Plan: Jacob Bradford is  a very pleasant 58 yo caucasian gentleman with secondary polycythemia.  We will definitely phlebotomize him today.  I want to make sure that his hematocrit is below 45%.  We will try to get him through the holidays now.  I will see about getting him back in January.  Hopefully, by then, the family situation will have been taken care of.Volanda Napoleon, MD 10/14/20202:38 PM

## 2019-03-18 NOTE — Progress Notes (Signed)
Jacob Bradford presents today for phlebotomy per MD orders. Phlebotomy procedure started at 1445 and ended at 1525..A total of 543 cc removed via 16 G needle at L and R antecubital sites through three attempts. Patient tolerated procedure well. Refused to wait 30 minutes after phlebotomy, released stable and ASX.

## 2019-03-19 LAB — IRON AND TIBC
Iron: 178 ug/dL — ABNORMAL HIGH (ref 42–163)
Saturation Ratios: 35 % (ref 20–55)
TIBC: 509 ug/dL — ABNORMAL HIGH (ref 202–409)
UIBC: 331 ug/dL (ref 117–376)

## 2019-03-19 LAB — FERRITIN: Ferritin: 34 ng/mL (ref 24–336)

## 2019-04-20 ENCOUNTER — Other Ambulatory Visit: Payer: Self-pay | Admitting: Hematology & Oncology

## 2019-04-20 DIAGNOSIS — M47817 Spondylosis without myelopathy or radiculopathy, lumbosacral region: Secondary | ICD-10-CM

## 2019-04-20 DIAGNOSIS — S63591S Other specified sprain of right wrist, sequela: Secondary | ICD-10-CM

## 2019-04-20 MED ORDER — MORPHINE SULFATE ER 60 MG PO TBCR: 60.0000 mg | EXTENDED_RELEASE_TABLET | Freq: Two times a day (BID) | ORAL | 0 refills | Status: DC

## 2019-04-20 MED ORDER — MORPHINE SULFATE 30 MG PO TABS: ORAL_TABLET | ORAL | 0 refills | Status: DC

## 2019-05-18 ENCOUNTER — Other Ambulatory Visit: Payer: Self-pay | Admitting: Hematology & Oncology

## 2019-05-18 DIAGNOSIS — M47817 Spondylosis without myelopathy or radiculopathy, lumbosacral region: Secondary | ICD-10-CM

## 2019-05-18 DIAGNOSIS — S63591S Other specified sprain of right wrist, sequela: Secondary | ICD-10-CM

## 2019-05-18 MED ORDER — MORPHINE SULFATE 30 MG PO TABS: ORAL_TABLET | ORAL | 0 refills | Status: DC

## 2019-05-18 MED ORDER — MORPHINE SULFATE ER 60 MG PO TBCR: 60.0000 mg | EXTENDED_RELEASE_TABLET | Freq: Two times a day (BID) | ORAL | 0 refills | Status: DC

## 2019-06-15 ENCOUNTER — Other Ambulatory Visit: Payer: Self-pay | Admitting: Hematology & Oncology

## 2019-06-15 DIAGNOSIS — S63591S Other specified sprain of right wrist, sequela: Secondary | ICD-10-CM

## 2019-06-15 DIAGNOSIS — M47817 Spondylosis without myelopathy or radiculopathy, lumbosacral region: Secondary | ICD-10-CM

## 2019-06-15 MED ORDER — MORPHINE SULFATE ER 60 MG PO TBCR: 60.0000 mg | EXTENDED_RELEASE_TABLET | Freq: Two times a day (BID) | ORAL | 0 refills | Status: DC

## 2019-06-15 MED ORDER — MORPHINE SULFATE 30 MG PO TABS: ORAL_TABLET | ORAL | 0 refills | Status: DC

## 2019-06-18 ENCOUNTER — Inpatient Hospital Stay: Payer: Medicare HMO

## 2019-06-18 ENCOUNTER — Inpatient Hospital Stay: Payer: Medicare HMO | Admitting: Hematology & Oncology

## 2019-07-16 ENCOUNTER — Other Ambulatory Visit: Payer: Self-pay | Admitting: Hematology & Oncology

## 2019-07-16 DIAGNOSIS — M47817 Spondylosis without myelopathy or radiculopathy, lumbosacral region: Secondary | ICD-10-CM

## 2019-07-16 DIAGNOSIS — S63591S Other specified sprain of right wrist, sequela: Secondary | ICD-10-CM

## 2019-07-16 MED ORDER — MORPHINE SULFATE 30 MG PO TABS: ORAL_TABLET | ORAL | 0 refills | Status: DC

## 2019-07-16 MED ORDER — MORPHINE SULFATE ER 60 MG PO TBCR: 60.0000 mg | EXTENDED_RELEASE_TABLET | Freq: Two times a day (BID) | ORAL | 0 refills | Status: DC

## 2019-08-14 ENCOUNTER — Other Ambulatory Visit: Payer: Self-pay | Admitting: Hematology & Oncology

## 2019-08-14 DIAGNOSIS — M47817 Spondylosis without myelopathy or radiculopathy, lumbosacral region: Secondary | ICD-10-CM

## 2019-08-14 DIAGNOSIS — S63591S Other specified sprain of right wrist, sequela: Secondary | ICD-10-CM

## 2019-08-14 MED ORDER — MORPHINE SULFATE 30 MG PO TABS: ORAL_TABLET | ORAL | 0 refills | Status: DC

## 2019-08-14 MED ORDER — MORPHINE SULFATE ER 60 MG PO TBCR: 60.0000 mg | EXTENDED_RELEASE_TABLET | Freq: Two times a day (BID) | ORAL | 0 refills | Status: DC

## 2019-08-20 ENCOUNTER — Other Ambulatory Visit: Payer: Self-pay

## 2019-08-20 ENCOUNTER — Inpatient Hospital Stay (HOSPITAL_BASED_OUTPATIENT_CLINIC_OR_DEPARTMENT_OTHER): Payer: Medicare HMO | Admitting: Hematology & Oncology

## 2019-08-20 ENCOUNTER — Inpatient Hospital Stay: Payer: Medicare HMO | Attending: Hematology & Oncology

## 2019-08-20 ENCOUNTER — Encounter: Payer: Self-pay | Admitting: Hematology & Oncology

## 2019-08-20 ENCOUNTER — Inpatient Hospital Stay: Payer: Medicare HMO

## 2019-08-20 VITALS — BP 152/78 | HR 89 | Temp 97.1°F | Resp 18 | Ht 72.0 in | Wt 266.0 lb

## 2019-08-20 DIAGNOSIS — Z7982 Long term (current) use of aspirin: Secondary | ICD-10-CM | POA: Diagnosis not present

## 2019-08-20 DIAGNOSIS — G8929 Other chronic pain: Secondary | ICD-10-CM | POA: Insufficient documentation

## 2019-08-20 DIAGNOSIS — Z79899 Other long term (current) drug therapy: Secondary | ICD-10-CM | POA: Diagnosis not present

## 2019-08-20 DIAGNOSIS — D45 Polycythemia vera: Secondary | ICD-10-CM

## 2019-08-20 DIAGNOSIS — M545 Low back pain: Secondary | ICD-10-CM | POA: Diagnosis not present

## 2019-08-20 DIAGNOSIS — D751 Secondary polycythemia: Secondary | ICD-10-CM | POA: Insufficient documentation

## 2019-08-20 LAB — CMP (CANCER CENTER ONLY)
ALT: 53 U/L — ABNORMAL HIGH (ref 0–44)
AST: 64 U/L — ABNORMAL HIGH (ref 15–41)
Albumin: 4.1 g/dL (ref 3.5–5.0)
Alkaline Phosphatase: 75 U/L (ref 38–126)
Anion gap: 8 (ref 5–15)
BUN: 9 mg/dL (ref 6–20)
CO2: 29 mmol/L (ref 22–32)
Calcium: 8.7 mg/dL — ABNORMAL LOW (ref 8.9–10.3)
Chloride: 101 mmol/L (ref 98–111)
Creatinine: 0.88 mg/dL (ref 0.61–1.24)
GFR, Est AFR Am: >60 mL/min (ref >60)
GFR, Estimated: >60 mL/min (ref >60)
Glucose, Bld: 169 mg/dL — ABNORMAL HIGH (ref 70–99)
Potassium: 3.7 mmol/L (ref 3.5–5.1)
Sodium: 138 mmol/L (ref 135–145)
Total Bilirubin: 0.9 mg/dL (ref 0.3–1.2)
Total Protein: 6.5 g/dL (ref 6.5–8.1)

## 2019-08-20 LAB — CBC WITH DIFFERENTIAL (CANCER CENTER ONLY)
Abs Immature Granulocytes: 0.03 10*3/uL (ref 0.00–0.07)
Basophils Absolute: 0.1 10*3/uL (ref 0.0–0.1)
Basophils Relative: 1 %
Eosinophils Absolute: 0.7 10*3/uL — ABNORMAL HIGH (ref 0.0–0.5)
Eosinophils Relative: 10 %
HCT: 50.1 % (ref 39.0–52.0)
Hemoglobin: 16.4 g/dL (ref 13.0–17.0)
Immature Granulocytes: 0 %
Lymphocytes Relative: 14 %
Lymphs Abs: 1 10*3/uL (ref 0.7–4.0)
MCH: 29 pg (ref 26.0–34.0)
MCHC: 32.7 g/dL (ref 30.0–36.0)
MCV: 88.5 fL (ref 80.0–100.0)
Monocytes Absolute: 0.5 10*3/uL (ref 0.1–1.0)
Monocytes Relative: 7 %
Neutro Abs: 4.8 10*3/uL (ref 1.7–7.7)
Neutrophils Relative %: 68 %
Platelet Count: 157 10*3/uL (ref 150–400)
RBC: 5.66 MIL/uL (ref 4.22–5.81)
RDW: 13.8 % (ref 11.5–15.5)
WBC Count: 7.1 10*3/uL (ref 4.0–10.5)
nRBC: 0 % (ref 0.0–0.2)
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
## 2019-09-14 ENCOUNTER — Other Ambulatory Visit: Payer: Self-pay | Admitting: Hematology & Oncology

## 2019-09-14 DIAGNOSIS — M47817 Spondylosis without myelopathy or radiculopathy, lumbosacral region: Secondary | ICD-10-CM

## 2019-09-14 DIAGNOSIS — S63591S Other specified sprain of right wrist, sequela: Secondary | ICD-10-CM

## 2019-09-14 MED ORDER — MORPHINE SULFATE 30 MG PO TABS: ORAL_TABLET | ORAL | 0 refills | Status: DC

## 2019-09-14 MED ORDER — MORPHINE SULFATE ER 60 MG PO TBCR: 60.0000 mg | EXTENDED_RELEASE_TABLET | Freq: Two times a day (BID) | ORAL | 0 refills | Status: DC

## 2019-10-14 ENCOUNTER — Other Ambulatory Visit: Payer: Self-pay | Admitting: Hematology & Oncology

## 2019-10-14 DIAGNOSIS — S63591S Other specified sprain of right wrist, sequela: Secondary | ICD-10-CM

## 2019-10-14 DIAGNOSIS — M47817 Spondylosis without myelopathy or radiculopathy, lumbosacral region: Secondary | ICD-10-CM

## 2019-10-14 MED ORDER — MORPHINE SULFATE 30 MG PO TABS: ORAL_TABLET | ORAL | 0 refills | Status: DC

## 2019-10-14 MED ORDER — MORPHINE SULFATE ER 60 MG PO TBCR: 60.0000 mg | EXTENDED_RELEASE_TABLET | Freq: Two times a day (BID) | ORAL | 0 refills | Status: DC

## 2019-10-26 ENCOUNTER — Inpatient Hospital Stay: Payer: Medicare HMO

## 2019-10-26 ENCOUNTER — Inpatient Hospital Stay: Payer: Medicare HMO | Admitting: Hematology & Oncology

## 2019-10-26 ENCOUNTER — Inpatient Hospital Stay: Payer: Medicare HMO | Attending: Hematology & Oncology

## 2019-11-11 ENCOUNTER — Other Ambulatory Visit: Payer: Self-pay | Admitting: Hematology & Oncology

## 2019-11-11 DIAGNOSIS — M47817 Spondylosis without myelopathy or radiculopathy, lumbosacral region: Secondary | ICD-10-CM

## 2019-11-11 DIAGNOSIS — S63591S Other specified sprain of right wrist, sequela: Secondary | ICD-10-CM

## 2019-11-11 MED ORDER — MORPHINE SULFATE 30 MG PO TABS: ORAL_TABLET | ORAL | 0 refills | Status: DC

## 2019-11-11 MED ORDER — MORPHINE SULFATE ER 60 MG PO TBCR: 60.0000 mg | EXTENDED_RELEASE_TABLET | Freq: Two times a day (BID) | ORAL | 0 refills | Status: DC

## 2019-12-04 ENCOUNTER — Encounter: Payer: Self-pay | Admitting: Hematology & Oncology

## 2019-12-04 ENCOUNTER — Inpatient Hospital Stay: Payer: Medicare HMO

## 2019-12-04 ENCOUNTER — Inpatient Hospital Stay: Payer: Medicare HMO | Attending: Hematology & Oncology

## 2019-12-04 ENCOUNTER — Other Ambulatory Visit: Payer: Self-pay

## 2019-12-04 ENCOUNTER — Inpatient Hospital Stay (HOSPITAL_BASED_OUTPATIENT_CLINIC_OR_DEPARTMENT_OTHER): Payer: Medicare HMO | Admitting: Hematology & Oncology

## 2019-12-04 VITALS — BP 150/86 | HR 79 | Temp 98.5°F | Resp 18 | Wt 257.0 lb

## 2019-12-04 VITALS — BP 126/86 | HR 81 | Resp 18

## 2019-12-04 DIAGNOSIS — Z981 Arthrodesis status: Secondary | ICD-10-CM | POA: Insufficient documentation

## 2019-12-04 DIAGNOSIS — R945 Abnormal results of liver function studies: Secondary | ICD-10-CM | POA: Diagnosis not present

## 2019-12-04 DIAGNOSIS — K439 Ventral hernia without obstruction or gangrene: Secondary | ICD-10-CM | POA: Diagnosis not present

## 2019-12-04 DIAGNOSIS — D45 Polycythemia vera: Secondary | ICD-10-CM

## 2019-12-04 DIAGNOSIS — Z79899 Other long term (current) drug therapy: Secondary | ICD-10-CM | POA: Diagnosis not present

## 2019-12-04 DIAGNOSIS — Z7982 Long term (current) use of aspirin: Secondary | ICD-10-CM | POA: Diagnosis not present

## 2019-12-04 DIAGNOSIS — M545 Low back pain: Secondary | ICD-10-CM | POA: Insufficient documentation

## 2019-12-04 DIAGNOSIS — F17210 Nicotine dependence, cigarettes, uncomplicated: Secondary | ICD-10-CM | POA: Insufficient documentation

## 2019-12-04 DIAGNOSIS — G8929 Other chronic pain: Secondary | ICD-10-CM | POA: Diagnosis not present

## 2019-12-04 DIAGNOSIS — R739 Hyperglycemia, unspecified: Secondary | ICD-10-CM | POA: Diagnosis not present

## 2019-12-04 LAB — CBC WITH DIFFERENTIAL (CANCER CENTER ONLY)
Abs Immature Granulocytes: 0.05 10*3/uL (ref 0.00–0.07)
Basophils Absolute: 0.1 10*3/uL (ref 0.0–0.1)
Basophils Relative: 1 %
Eosinophils Absolute: 0.3 10*3/uL (ref 0.0–0.5)
Eosinophils Relative: 3 %
HCT: 50 % (ref 39.0–52.0)
Hemoglobin: 16.6 g/dL (ref 13.0–17.0)
Immature Granulocytes: 1 %
Lymphocytes Relative: 26 %
Lymphs Abs: 2.3 10*3/uL (ref 0.7–4.0)
MCH: 28.8 pg (ref 26.0–34.0)
MCHC: 33.2 g/dL (ref 30.0–36.0)
MCV: 86.7 fL (ref 80.0–100.0)
Monocytes Absolute: 0.7 10*3/uL (ref 0.1–1.0)
Monocytes Relative: 8 %
Neutro Abs: 5.8 10*3/uL (ref 1.7–7.7)
Neutrophils Relative %: 61 %
Platelet Count: 267 10*3/uL (ref 150–400)
RBC: 5.77 MIL/uL (ref 4.22–5.81)
RDW: 13.2 % (ref 11.5–15.5)
WBC Count: 9.2 10*3/uL (ref 4.0–10.5)
nRBC: 0 % (ref 0.0–0.2)

## 2019-12-04 LAB — FERRITIN: Ferritin: 71 ng/mL (ref 24–336)

## 2019-12-04 LAB — CMP (CANCER CENTER ONLY)
ALT: 33 U/L (ref 0–44)
AST: 30 U/L (ref 15–41)
Albumin: 4.2 g/dL (ref 3.5–5.0)
Alkaline Phosphatase: 75 U/L (ref 38–126)
Anion gap: 7 (ref 5–15)
BUN: 18 mg/dL (ref 6–20)
CO2: 34 mmol/L — ABNORMAL HIGH (ref 22–32)
Calcium: 10 mg/dL (ref 8.9–10.3)
Chloride: 98 mmol/L (ref 98–111)
Creatinine: 1.15 mg/dL (ref 0.61–1.24)
GFR, Est AFR Am: >60 mL/min (ref >60)
GFR, Estimated: >60 mL/min (ref >60)
Glucose, Bld: 102 mg/dL — ABNORMAL HIGH (ref 70–99)
Potassium: 3.7 mmol/L (ref 3.5–5.1)
Sodium: 139 mmol/L (ref 135–145)
Total Bilirubin: 0.5 mg/dL (ref 0.3–1.2)
Total Protein: 7.3 g/dL (ref 6.5–8.1)

## 2019-12-04 LAB — IRON AND TIBC
Iron: 113 ug/dL (ref 42–163)
Saturation Ratios: 26 % (ref 20–55)
TIBC: 444 ug/dL — ABNORMAL HIGH (ref 202–409)
UIBC: 330 ug/dL (ref 117–376)

## 2019-12-04 LAB — LACTATE DEHYDROGENASE: LDH: 186 U/L (ref 98–192)

## 2019-12-04 NOTE — Progress Notes (Signed)
Jacob Bradford presents today for phlebotomy per MD orders. Phlebotomy procedure started at 1038 and ended at 1043 with 315   grams removed by Teodora RN before clotting in [place]. Restarted at 0950 with 16 G to RAC by Reginia Forts RN and I then resumed care of patient.  This Phlebotomy concluded at 0954 with 300 g per patient request "since I missed my last one please take off a little more".  Patient observed for 30 minutes after procedure without any incident- pt agreed to stay for 15 minutes with VS taken.  Patient tolerated procedure well. Diet and nutrition offered.
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
## 2019-12-09 ENCOUNTER — Other Ambulatory Visit: Payer: Self-pay | Admitting: Hematology & Oncology

## 2019-12-09 DIAGNOSIS — M47817 Spondylosis without myelopathy or radiculopathy, lumbosacral region: Secondary | ICD-10-CM

## 2019-12-09 DIAGNOSIS — S63591S Other specified sprain of right wrist, sequela: Secondary | ICD-10-CM

## 2020-01-11 ENCOUNTER — Other Ambulatory Visit: Payer: Self-pay | Admitting: Hematology & Oncology

## 2020-01-11 DIAGNOSIS — M47817 Spondylosis without myelopathy or radiculopathy, lumbosacral region: Secondary | ICD-10-CM

## 2020-01-11 DIAGNOSIS — S63591S Other specified sprain of right wrist, sequela: Secondary | ICD-10-CM

## 2020-01-11 MED ORDER — MORPHINE SULFATE 30 MG PO TABS: ORAL_TABLET | ORAL | 0 refills | Status: DC

## 2020-01-11 MED ORDER — MORPHINE SULFATE ER 60 MG PO TBCR: 60.0000 mg | EXTENDED_RELEASE_TABLET | Freq: Two times a day (BID) | ORAL | 0 refills | Status: DC

## 2020-02-04 ENCOUNTER — Inpatient Hospital Stay: Payer: Medicare HMO

## 2020-02-04 ENCOUNTER — Other Ambulatory Visit: Payer: Self-pay

## 2020-02-04 ENCOUNTER — Telehealth: Payer: Self-pay | Admitting: Hematology & Oncology

## 2020-02-04 ENCOUNTER — Encounter: Payer: Self-pay | Admitting: Hematology & Oncology

## 2020-02-04 ENCOUNTER — Inpatient Hospital Stay: Payer: Medicare HMO | Attending: Hematology & Oncology | Admitting: Hematology & Oncology

## 2020-02-04 VITALS — BP 140/73 | HR 86 | Temp 98.9°F | Resp 16 | Wt 263.0 lb

## 2020-02-04 VITALS — BP 132/77 | HR 89 | Resp 17

## 2020-02-04 DIAGNOSIS — D45 Polycythemia vera: Secondary | ICD-10-CM | POA: Diagnosis not present

## 2020-02-04 LAB — CMP (CANCER CENTER ONLY)
ALT: 60 U/L — ABNORMAL HIGH (ref 0–44)
AST: 39 U/L (ref 15–41)
Albumin: 3.8 g/dL (ref 3.5–5.0)
Alkaline Phosphatase: 70 U/L (ref 38–126)
Anion gap: 9 (ref 5–15)
BUN: 16 mg/dL (ref 6–20)
CO2: 33 mmol/L — ABNORMAL HIGH (ref 22–32)
Calcium: 9.9 mg/dL (ref 8.9–10.3)
Chloride: 98 mmol/L (ref 98–111)
Creatinine: 1.23 mg/dL (ref 0.61–1.24)
GFR, Est AFR Am: >60 mL/min (ref >60)
GFR, Estimated: >60 mL/min (ref >60)
Glucose, Bld: 163 mg/dL — ABNORMAL HIGH (ref 70–99)
Potassium: 4 mmol/L (ref 3.5–5.1)
Sodium: 140 mmol/L (ref 135–145)
Total Bilirubin: 0.5 mg/dL (ref 0.3–1.2)
Total Protein: 6.9 g/dL (ref 6.5–8.1)

## 2020-02-04 LAB — CBC WITH DIFFERENTIAL (CANCER CENTER ONLY)
Abs Immature Granulocytes: 0.04 10*3/uL (ref 0.00–0.07)
Basophils Absolute: 0.1 10*3/uL (ref 0.0–0.1)
Basophils Relative: 1 %
Eosinophils Absolute: 0.4 10*3/uL (ref 0.0–0.5)
Eosinophils Relative: 5 %
HCT: 46.6 % (ref 39.0–52.0)
Hemoglobin: 15.5 g/dL (ref 13.0–17.0)
Immature Granulocytes: 1 %
Lymphocytes Relative: 22 %
Lymphs Abs: 1.8 10*3/uL (ref 0.7–4.0)
MCH: 29.2 pg (ref 26.0–34.0)
MCHC: 33.3 g/dL (ref 30.0–36.0)
MCV: 87.8 fL (ref 80.0–100.0)
Monocytes Absolute: 0.5 10*3/uL (ref 0.1–1.0)
Monocytes Relative: 6 %
Neutro Abs: 5.4 10*3/uL (ref 1.7–7.7)
Neutrophils Relative %: 65 %
Platelet Count: 220 10*3/uL (ref 150–400)
RBC: 5.31 MIL/uL (ref 4.22–5.81)
RDW: 12.8 % (ref 11.5–15.5)
WBC Count: 8.2 10*3/uL (ref 4.0–10.5)
nRBC: 0 % (ref 0.0–0.2)

## 2020-02-04 LAB — IRON AND TIBC
Iron: 84 ug/dL (ref 42–163)
Saturation Ratios: 20 % (ref 20–55)
TIBC: 431 ug/dL — ABNORMAL HIGH (ref 202–409)
UIBC: 347 ug/dL (ref 117–376)

## 2020-02-04 LAB — FERRITIN: Ferritin: 71 ng/mL (ref 24–336)

## 2020-02-04 NOTE — Telephone Encounter (Signed)
Appointments scheduled calendar printed per 9/2 los

## 2020-02-04 NOTE — Patient Instructions (Signed)
Therapeutic Phlebotomy Therapeutic phlebotomy is the planned removal of blood from a person's body for the purpose of treating a medical condition. The procedure is similar to donating blood. Usually, about a pint (470 mL, or 0.47 L) of blood is removed. The average adult has 9-12 pints (4.3-5.7 L) of blood in the body. Therapeutic phlebotomy may be used to treat the following medical conditions:  Hemochromatosis. This is a condition in which the blood contains too much iron.  Polycythemia vera. This is a condition in which the blood contains too many red blood cells.  Porphyria cutanea tarda. This is a disease in which an important part of hemoglobin is not made properly. It results in the buildup of abnormal amounts of porphyrins in the body.  Sickle cell disease. This is a condition in which the red blood cells form an abnormal crescent shape rather than a round shape. Tell a health care provider about:  Any allergies you have.  All medicines you are taking, including vitamins, herbs, eye drops, creams, and over-the-counter medicines.  Any problems you or family members have had with anesthetic medicines.  Any blood disorders you have.  Any surgeries you have had.  Any medical conditions you have.  Whether you are pregnant or may be pregnant. What are the risks? Generally, this is a safe procedure. However, problems may occur, including:  Nausea or light-headedness.  Low blood pressure (hypotension).  Soreness, bleeding, swelling, or bruising at the needle insertion site.  Infection. What happens before the procedure?  Follow instructions from your health care provider about eating or drinking restrictions.  Ask your health care provider about: ? Changing or stopping your regular medicines. This is especially important if you are taking diabetes medicines or blood thinners (anticoagulants). ? Taking medicines such as aspirin and ibuprofen. These medicines can thin your  blood. Do not take these medicines unless your health care provider tells you to take them. ? Taking over-the-counter medicines, vitamins, herbs, and supplements.  Wear clothing with sleeves that can be raised above the elbow.  Plan to have someone take you home from the hospital or clinic.  You may have a blood sample taken.  Your blood pressure, pulse rate, and breathing rate will be measured. What happens during the procedure?   To lower your risk of infection: ? Your health care team will wash or sanitize their hands. ? Your skin will be cleaned with an antiseptic.  You may be given a medicine to numb the area (local anesthetic).  A tourniquet will be placed on your arm.  A needle will be inserted into one of your veins.  Tubing and a collection bag will be attached to that needle.  Blood will flow through the needle and tubing into the collection bag.  The collection bag will be placed lower than your arm to allow gravity to help the flow of blood into the bag.  You may be asked to open and close your hand slowly and continually during the entire collection.  After the specified amount of blood has been removed from your body, the collection bag and tubing will be clamped.  The needle will be removed from your vein.  Pressure will be held on the site of the needle insertion to stop the bleeding.  A bandage (dressing) will be placed over the needle insertion site. The procedure may vary among health care providers and hospitals. What happens after the procedure?  Your blood pressure, pulse rate, and breathing rate will be   measured after the procedure.  You will be encouraged to drink fluids.  Your recovery will be assessed and monitored.  You can return to your normal activities as told by your health care provider. Summary  Therapeutic phlebotomy is the planned removal of blood from a person's body for the purpose of treating a medical condition.  Therapeutic  phlebotomy may be used to treat hemochromatosis, polycythemia vera, porphyria cutanea tarda, or sickle cell disease.  In the procedure, a needle is inserted and about a pint (470 mL, or 0.47 L) of blood is removed. The average adult has 9-12 pints (4.3-5.7 L) of blood in the body.  This is generally a safe procedure, but it can sometimes cause problems such as nausea, light-headedness, or low blood pressure (hypotension). This information is not intended to replace advice given to you by your health care provider. Make sure you discuss any questions you have with your health care provider. Document Revised: 06/06/2017 Document Reviewed: 06/06/2017 Elsevier Patient Education  2020 [employer]  

## 2020-02-04 NOTE — Progress Notes (Signed)
Jacob Bradford presents today for phlebotomy per MD orders. Phlebotomy procedure started at 0925 and ended at 0948 619 grams removed. Patient observed for 30 minutes after procedure without any incident. Patient tolerated procedure well. IV needle removed intact.

## 2020-02-04 NOTE — Progress Notes (Signed)
Hematology and Oncology Follow Up Visit  ELBY BLACKWELDER [id number] [birth year] 59 y.o. 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 Fristoe is here today for follow-up.  He is doing okay.  Unfortunate his weight still is going up.  He is trying to cut back on cigarettes.  He has not had the coronavirus vaccine.  He is still thinking about this.    There is been no problems with headache.  He has had no problems with cough or shortness of breath.  There is been no change in bowel or bladder habits.  When we last saw him in early July, his ferritin was 71 with an iron saturation of 26%.    Overall, he is doing very well with the fact to his chronic pain.  Overall, I would say his performance status is ECOG 1.  Medications:  Allergies as of 02/04/2020   No Known Allergies     Medication List       Accurate as of February 04, 2020  9:20 AM. If you have any questions, ask your nurse or doctor.        acetaminophen 500 MG tablet Commonly known as: TYLENOL Take 1,000 mg by mouth every 6 (six) hours as needed for moderate pain.   ALPRAZolam 1 MG tablet Commonly known as: XANAX Take 1 tablet (1 mg total) by mouth 3 (three) times daily as needed for anxiety. What changed: how much to take   aspirin 81 MG tablet Take 1 tablet (81 mg total) by mouth daily.   esomeprazole 40 MG capsule Commonly known as: NEXIUM Take 40 mg by mouth daily at 12 noon.   gemfibrozil 600 MG tablet Commonly known as: LOPID Take 600 mg by mouth 2 (two) times daily.   lisinopril 40 MG tablet Commonly known as: ZESTRIL Take 40 mg by mouth daily.   losartan-hydrochlorothiazide 100-12.5 MG  tablet Commonly known as: HYZAAR Take 1 tablet by mouth daily.   morphine 60 MG 12 hr tablet Commonly known as: MS CONTIN Take 1 tablet (60 mg total) by mouth every 12 (twelve) hours.   morphine 30 MG tablet Commonly known as: MSIR TAKE  (1) OR (2)  TABLETS EVERY SIX HOURS AS NEEDED FOR PAIN.    - MAY MAKE DROWSY -   omeprazole 20 MG capsule Commonly known as: PRILOSEC Take 20 mg by mouth daily.   polyethylene glycol 17 g packet Commonly known as: MIRALAX / GLYCOLAX Take 17 g by mouth daily as needed for moderate constipation.       Allergies:  No Known Allergies  Past Medical History, Surgical history, Social history, and Family History were reviewed and updated.  Review of Systems: Review of Systems  Constitutional: Negative.   HENT: Negative.   Eyes: Negative.   Respiratory: Negative.   Cardiovascular: Negative.   Gastrointestinal: Negative.   Genitourinary: Negative.   Musculoskeletal: Positive for back pain and neck pain.  Skin: Negative.   Neurological: Negative.   Endo/Heme/Allergies: Negative.   Psychiatric/Behavioral: Negative.      Physical Exam:  weight is 263 lb (119.3 kg). His oral temperature is 98.9 F (37.2 C). His blood pressure is 140/73 and his pulse  is 86. His respiration is 16 and oxygen saturation is 96%.   Wt Readings from Last 3 Encounters:  02/04/20 263 lb (119.3 kg)  12/04/19 257 lb (116.6 kg)  08/20/19 266 lb (120.7 kg)    Physical Exam Vitals reviewed.  HENT:     Head: Normocephalic and atraumatic.  Eyes:     Pupils: Pupils are equal, round, and reactive to light.  Cardiovascular:     Rate and Rhythm: Normal rate and regular rhythm.     Heart sounds: Normal heart sounds.  Pulmonary:     Effort: Pulmonary effort is normal.     Breath sounds: Normal breath sounds.  Abdominal:     General: Bowel sounds are normal.     Palpations: Abdomen is soft.  Musculoskeletal:        General: No tenderness or deformity. Normal range  of motion.     Cervical back: Normal range of motion.  Lymphadenopathy:     Cervical: No cervical adenopathy.  Skin:    General: Skin is warm and dry.     Findings: No erythema or rash.  Neurological:     Mental Status: He is alert and oriented to person, place, and time.  Psychiatric:        Behavior: Behavior normal.        Thought Content: Thought content normal.        Judgment: Judgment normal.      Lab Results  Component Value Date   WBC 8.2 02/04/2020   HGB 15.5 02/04/2020   HCT 46.6 02/04/2020   MCV 87.8 02/04/2020   PLT 220 02/04/2020   Lab Results  Component Value Date   FERRITIN 71 12/04/2019   IRON 113 12/04/2019   TIBC 444 (H) 12/04/2019   UIBC 330 12/04/2019   IRONPCTSAT 26 12/04/2019   Lab Results  Component Value Date   RETICCTPCT 1.3 06/13/2018   RBC 5.31 02/04/2020   RETICCTABS 77.0 02/22/2011   No results found for: KPAFRELGTCHN, LAMBDASER, KAPLAMBRATIO No results found for: Kandis Cocking, IGMSERUM No results found for: Odetta Pink, SPEI   Chemistry      Component Value Date/Time   NA 140 02/04/2020 0838   NA 141 05/09/2017 0910   K 4.0 02/04/2020 0838   K 4.2 05/09/2017 0910   CL 98 02/04/2020 0838   CL 105 11/13/2016 1123   CO2 33 (H) 02/04/2020 0838   CO2 24 05/09/2017 0910   BUN 16 02/04/2020 0838   BUN 15.2 05/09/2017 0910   CREATININE 1.23 02/04/2020 0838   CREATININE 1.2 05/09/2017 0910      Component Value Date/Time   CALCIUM 9.9 02/04/2020 0838   CALCIUM 9.1 05/09/2017 0910   ALKPHOS 70 02/04/2020 0838   ALKPHOS 96 05/09/2017 0910   AST 39 02/04/2020 0838   AST 22 05/09/2017 0910   ALT 60 (H) 02/04/2020 0838   ALT 30 05/09/2017 0910   BILITOT 0.5 02/04/2020 0838   BILITOT 0.48 05/09/2017 0910      Impression and Plan: Mr. Gammel is a very pleasant 59 yo caucasian gentleman with secondary polycythemia.  We will definitely phlebotomize him today.  I want to make  sure that his hematocrit is below 45%.  He is taking aspirin so this is certainly encouraging.  Hopefully, he will lose little bit of weight.  I think if he loses weight, this will certainly help with his hematocrit.  I would like to see him back before  the Thanksgiving holiday.  I would like to make sure that we get his blood to where it needs to be and get him through the holiday season.   Volanda Napoleon, MD 9/2/20219:20 AM

## 2020-02-12 ENCOUNTER — Other Ambulatory Visit: Payer: Self-pay | Admitting: Hematology & Oncology

## 2020-02-12 DIAGNOSIS — S63591S Other specified sprain of right wrist, sequela: Secondary | ICD-10-CM

## 2020-02-12 DIAGNOSIS — M47817 Spondylosis without myelopathy or radiculopathy, lumbosacral region: Secondary | ICD-10-CM

## 2020-03-11 ENCOUNTER — Other Ambulatory Visit: Payer: Self-pay | Admitting: Hematology & Oncology

## 2020-03-11 ENCOUNTER — Other Ambulatory Visit: Payer: Self-pay | Admitting: *Deleted

## 2020-03-11 DIAGNOSIS — S63591S Other specified sprain of right wrist, sequela: Secondary | ICD-10-CM

## 2020-03-11 DIAGNOSIS — M47817 Spondylosis without myelopathy or radiculopathy, lumbosacral region: Secondary | ICD-10-CM

## 2020-03-11 MED ORDER — MORPHINE SULFATE ER 60 MG PO TBCR
60.0000 mg | EXTENDED_RELEASE_TABLET | Freq: Two times a day (BID) | ORAL | 0 refills | Status: DC
Start: 2020-03-11 — End: 2020-04-11

## 2020-03-11 MED ORDER — MORPHINE SULFATE 30 MG PO TABS: ORAL_TABLET | ORAL | 0 refills | Status: DC

## 2020-04-11 ENCOUNTER — Other Ambulatory Visit: Payer: Self-pay | Admitting: Hematology & Oncology

## 2020-04-14 ENCOUNTER — Inpatient Hospital Stay: Payer: Medicare HMO | Attending: Hematology & Oncology

## 2020-04-14 ENCOUNTER — Inpatient Hospital Stay: Payer: Medicare HMO

## 2020-04-14 ENCOUNTER — Other Ambulatory Visit: Payer: Self-pay

## 2020-04-14 ENCOUNTER — Encounter: Payer: Self-pay | Admitting: Hematology & Oncology

## 2020-04-14 ENCOUNTER — Telehealth: Payer: Self-pay

## 2020-04-14 ENCOUNTER — Inpatient Hospital Stay (HOSPITAL_BASED_OUTPATIENT_CLINIC_OR_DEPARTMENT_OTHER): Payer: Medicare HMO | Admitting: Hematology & Oncology

## 2020-04-14 VITALS — BP 146/71 | HR 81 | Temp 98.8°F | Resp 18 | Wt 275.0 lb

## 2020-04-14 DIAGNOSIS — Z452 Encounter for adjustment and management of vascular access device: Secondary | ICD-10-CM | POA: Diagnosis not present

## 2020-04-14 DIAGNOSIS — D45 Polycythemia vera: Secondary | ICD-10-CM | POA: Diagnosis not present

## 2020-04-14 LAB — CBC WITH DIFFERENTIAL (CANCER CENTER ONLY)
Abs Immature Granulocytes: 0.02 10*3/uL (ref 0.00–0.07)
Basophils Absolute: 0.1 10*3/uL (ref 0.0–0.1)
Basophils Relative: 1 %
Eosinophils Absolute: 0.6 10*3/uL — ABNORMAL HIGH (ref 0.0–0.5)
Eosinophils Relative: 8 %
HCT: 46.7 % (ref 39.0–52.0)
Hemoglobin: 14.8 g/dL (ref 13.0–17.0)
Immature Granulocytes: 0 %
Lymphocytes Relative: 19 %
Lymphs Abs: 1.5 10*3/uL (ref 0.7–4.0)
MCH: 28.5 pg (ref 26.0–34.0)
MCHC: 31.7 g/dL (ref 30.0–36.0)
MCV: 89.8 fL (ref 80.0–100.0)
Monocytes Absolute: 0.9 10*3/uL (ref 0.1–1.0)
Monocytes Relative: 12 %
Neutro Abs: 4.7 10*3/uL (ref 1.7–7.7)
Neutrophils Relative %: 60 %
Platelet Count: 175 10*3/uL (ref 150–400)
RBC: 5.2 MIL/uL (ref 4.22–5.81)
RDW: 12.9 % (ref 11.5–15.5)
WBC Count: 7.8 10*3/uL (ref 4.0–10.5)
nRBC: 0 % (ref 0.0–0.2)

## 2020-04-14 LAB — CMP (CANCER CENTER ONLY)
ALT: 62 U/L — ABNORMAL HIGH (ref 0–44)
AST: 52 U/L — ABNORMAL HIGH (ref 15–41)
Albumin: 3.9 g/dL (ref 3.5–5.0)
Alkaline Phosphatase: 73 U/L (ref 38–126)
Anion gap: 6 (ref 5–15)
BUN: 20 mg/dL (ref 6–20)
CO2: 37 mmol/L — ABNORMAL HIGH (ref 22–32)
Calcium: 11 mg/dL — ABNORMAL HIGH (ref 8.9–10.3)
Chloride: 98 mmol/L (ref 98–111)
Creatinine: 1.21 mg/dL (ref 0.61–1.24)
GFR, Estimated: >60 mL/min
Glucose, Bld: 100 mg/dL — ABNORMAL HIGH (ref 70–99)
Potassium: 4.3 mmol/L (ref 3.5–5.1)
Sodium: 141 mmol/L (ref 135–145)
Total Bilirubin: 0.4 mg/dL (ref 0.3–1.2)
Total Protein: 6.7 g/dL (ref 6.5–8.1)

## 2020-04-14 LAB — IRON AND TIBC
Iron: 126 ug/dL (ref 42–163)
Saturation Ratios: 26 % (ref 20–55)
TIBC: 488 ug/dL — ABNORMAL HIGH (ref 202–409)
UIBC: 361 ug/dL (ref 117–376)

## 2020-04-14 LAB — FERRITIN: Ferritin: 55 ng/mL (ref 24–336)

## 2020-04-14 LAB — LACTATE DEHYDROGENASE: LDH: 212 U/L — ABNORMAL HIGH (ref 98–192)

## 2020-04-14 NOTE — Progress Notes (Signed)
Jacob Bradford presents today for phlebotomy per MD orders. Phlebotomy procedure started at 0950 with 16 g phlebotomy kit and ended at 1005. 550 grams removed. Refreshments given Patient observed for 30 minutes after procedure without any incident. Patient tolerated procedure well. IV needle removed intact.  Pt discharged in no apparent distress. Pt left ambulatory self without assistance. Pt aware of discharge instructions and verbalized understanding and had no further questions.

## 2020-04-14 NOTE — Patient Instructions (Addendum)
Patient is stable at discharge today.   Therapeutic Phlebotomy Therapeutic phlebotomy is the planned removal of blood from a person's body for the purpose of treating a medical condition. The procedure is similar to donating blood. Usually, about a pint (470 mL, or 0.47 L) of blood is removed. The average adult has 9-12 pints (4.3-5.7 L) of blood in the body. Therapeutic phlebotomy may be used to treat the following medical conditions:  Hemochromatosis. This is a condition in which the blood contains too much iron.  Polycythemia vera. This is a condition in which the blood contains too many red blood cells.  Porphyria cutanea tarda. This is a disease in which an important part of hemoglobin is not made properly. It results in the buildup of abnormal amounts of porphyrins in the body.  Sickle cell disease. This is a condition in which the red blood cells form an abnormal crescent shape rather than a round shape. Tell a health care provider about:  Any allergies you have.  All medicines you are taking, including vitamins, herbs, eye drops, creams, and over-the-counter medicines.  Any problems you or family members have had with anesthetic medicines.  Any blood disorders you have.  Any surgeries you have had.  Any medical conditions you have.  Whether you are pregnant or may be pregnant. What are the risks? Generally, this is a safe procedure. However, problems may occur, including:  Nausea or light-headedness.  Low blood pressure (hypotension).  Soreness, bleeding, swelling, or bruising at the needle insertion site.  Infection. What happens before the procedure?  Follow instructions from your health care provider about eating or drinking restrictions.  Ask your health care provider about: ? Changing or stopping your regular medicines. This is especially important if you are taking diabetes medicines or blood thinners (anticoagulants). ? Taking medicines such as aspirin  and ibuprofen. These medicines can thin your blood. Do not take these medicines unless your health care provider tells you to take them. ? Taking over-the-counter medicines, vitamins, herbs, and supplements.  Wear clothing with sleeves that can be raised above the elbow.  Plan to have someone take you home from the hospital or clinic.  You may have a blood sample taken.  Your blood pressure, pulse rate, and breathing rate will be measured. What happens during the procedure?   To lower your risk of infection: ? Your health care team will wash or sanitize their hands. ? Your skin will be cleaned with an antiseptic.  You may be given a medicine to numb the area (local anesthetic).  A tourniquet will be placed on your arm.  A needle will be inserted into one of your veins.  Tubing and a collection bag will be attached to that needle.  Blood will flow through the needle and tubing into the collection bag.  The collection bag will be placed lower than your arm to allow gravity to help the flow of blood into the bag.  You may be asked to open and close your hand slowly and continually during the entire collection.  After the specified amount of blood has been removed from your body, the collection bag and tubing will be clamped.  The needle will be removed from your vein.  Pressure will be held on the site of the needle insertion to stop the bleeding.  A bandage (dressing) will be placed over the needle insertion site. The procedure may vary among health care providers and hospitals. What happens after the procedure?  Your blood  pressure, pulse rate, and breathing rate will be measured after the procedure.  You will be encouraged to drink fluids.  Your recovery will be assessed and monitored.  You can return to your normal activities as told by your health care provider. Summary  Therapeutic phlebotomy is the planned removal of blood from a person's body for the purpose of  treating a medical condition.  Therapeutic phlebotomy may be used to treat hemochromatosis, polycythemia vera, porphyria cutanea tarda, or sickle cell disease.  In the procedure, a needle is inserted and about a pint (470 mL, or 0.47 L) of blood is removed. The average adult has 9-12 pints (4.3-5.7 L) of blood in the body.  This is generally a safe procedure, but it can sometimes cause problems such as nausea, light-headedness, or low blood pressure (hypotension). This information is not intended to replace advice given to you by your health care provider. Make sure you discuss any questions you have with your health care provider. Document Revised: 06/06/2017 Document Reviewed: 06/06/2017 Elsevier Patient Education  [place].

## 2020-04-14 NOTE — Telephone Encounter (Signed)
S/w pt and he is aware of his 06/23/20 appts per the 11.11.21 LOS.Marland KitchenMarland KitchenAOM

## 2020-04-14 NOTE — Progress Notes (Signed)
Hematology and Oncology Follow Up Visit  Jacob Bradford [id number] 1961/04/17 59 y.o. 04/14/2020   Principle Diagnosis:  Polycythemia vera - JAK2 negative Chronic low back pain secondary to degenerative disc disease - status post spinal fusion at L4-5 and L5-S1 Traumatic ligament damage to the right thumb and right knee  Current Therapy:   Phlebotomy to maintain hematocrit below 45% Aspirin 81 mg by mouth daily   Interim History:  Jacob Bradford is here today for follow-up.  He is doing okay.  His weight still is a problem.  His weight is up 12 pounds since we last saw him.  I just wish he would lose some weight.  He is going to see his family doctor soon.  He has had no problems with headache.  He has had no problems with cough or shortness of breath.  He is still hold off on the coronavirus vaccine.  He has had no bleeding.  He is on baby aspirin.  There is no change in bowel or bladder habits.  When we last saw him in September, his ferritin was 71 with iron saturation of 20%.  Overall, he is doing very well with the medications to help with his chronic pain.  Overall, I would say his performance status is ECOG 1.  Medications:  Allergies as of 04/14/2020   No Known Allergies     Medication List       Accurate as of April 14, 2020  9:19 AM. If you have any questions, ask your nurse or doctor.        acetaminophen 500 MG tablet Commonly known as: TYLENOL Take 1,000 mg by mouth every 6 (six) hours as needed for moderate pain.   ALPRAZolam 1 MG tablet Commonly known as: XANAX Take 1 tablet (1 mg total) by mouth 3 (three) times daily as needed for anxiety. What changed: how much to take   aspirin 81 MG tablet Take 1 tablet (81 mg total) by mouth daily.   esomeprazole 40 MG capsule Commonly known as: NEXIUM Take 40 mg by mouth daily at 12 noon.   gemfibrozil 600 MG tablet Commonly known as: LOPID Take 600 mg by mouth 2 (two) times daily.   lisinopril 40 MG  tablet Commonly known as: ZESTRIL Take 40 mg by mouth daily.   losartan-hydrochlorothiazide 100-12.5 MG tablet Commonly known as: HYZAAR Take 1 tablet by mouth daily.   morphine 30 MG tablet Commonly known as: MSIR TAKE  (1) OR (2)  TABLETS EVERY SIX HOURS AS NEEDED FOR PAIN.   - MAY MAKE DROWSY -   morphine 60 MG 12 hr tablet Commonly known as: MS CONTIN TAKE  (1)  TABLET  EVERY TWELVE HOURS.   omeprazole 20 MG capsule Commonly known as: PRILOSEC Take 20 mg by mouth daily.   polyethylene glycol 17 g packet Commonly known as: MIRALAX / GLYCOLAX Take 17 g by mouth daily as needed for moderate constipation.       Allergies:  No Known Allergies  Past Medical History, Surgical history, Social history, and Family History were reviewed and updated.  Review of Systems: Review of Systems  Constitutional: Negative.   HENT: Negative.   Eyes: Negative.   Respiratory: Negative.   Cardiovascular: Negative.   Gastrointestinal: Negative.   Genitourinary: Negative.   Musculoskeletal: Positive for back pain and neck pain.  Skin: Negative.   Neurological: Negative.   Endo/Heme/Allergies: Negative.   Psychiatric/Behavioral: Negative.      Physical Exam:  weight is 275  lb (124.7 kg). His oral temperature is 98.8 F (37.1 C). His blood pressure is 146/71 (abnormal) and his pulse is 81. His respiration is 18 and oxygen saturation is 94%.   Wt Readings from Last 3 Encounters:  04/14/20 275 lb (124.7 kg)  02/04/20 263 lb (119.3 kg)  12/04/19 257 lb (116.6 kg)    Physical Exam Vitals reviewed.  HENT:     Head: Normocephalic and atraumatic.  Eyes:     Pupils: Pupils are equal, round, and reactive to light.  Cardiovascular:     Rate and Rhythm: Normal rate and regular rhythm.     Heart sounds: Normal heart sounds.  Pulmonary:     Effort: Pulmonary effort is normal.     Breath sounds: Normal breath sounds.  Abdominal:     General: Bowel sounds are normal.     Palpations:  Abdomen is soft.  Musculoskeletal:        General: No tenderness or deformity. Normal range of motion.     Cervical back: Normal range of motion.  Lymphadenopathy:     Cervical: No cervical adenopathy.  Skin:    General: Skin is warm and dry.     Findings: No erythema or rash.  Neurological:     Mental Status: He is alert and oriented to person, place, and time.  Psychiatric:        Behavior: Behavior normal.        Thought Content: Thought content normal.        Judgment: Judgment normal.      Lab Results  Component Value Date   WBC 7.8 04/14/2020   HGB 14.8 04/14/2020   HCT 46.7 04/14/2020   MCV 89.8 04/14/2020   PLT 175 04/14/2020   Lab Results  Component Value Date   FERRITIN 71 02/04/2020   IRON 84 02/04/2020   TIBC 431 (H) 02/04/2020   UIBC 347 02/04/2020   IRONPCTSAT 20 02/04/2020   Lab Results  Component Value Date   RETICCTPCT 1.3 06/13/2018   RBC 5.20 04/14/2020   RETICCTABS 77.0 02/22/2011   No results found for: KPAFRELGTCHN, LAMBDASER, KAPLAMBRATIO No results found for: Kandis Cocking, IGMSERUM No results found for: Odetta Pink, SPEI   Chemistry      Component Value Date/Time   NA 141 04/14/2020 0841   NA 141 05/09/2017 0910   K 4.3 04/14/2020 0841   K 4.2 05/09/2017 0910   CL 98 04/14/2020 0841   CL 105 11/13/2016 1123   CO2 37 (H) 04/14/2020 0841   CO2 24 05/09/2017 0910   BUN 20 04/14/2020 0841   BUN 15.2 05/09/2017 0910   CREATININE 1.21 04/14/2020 0841   CREATININE 1.2 05/09/2017 0910      Component Value Date/Time   CALCIUM 11.0 (H) 04/14/2020 0841   CALCIUM 9.1 05/09/2017 0910   ALKPHOS 73 04/14/2020 0841   ALKPHOS 96 05/09/2017 0910   AST 52 (H) 04/14/2020 0841   AST 22 05/09/2017 0910   ALT 62 (H) 04/14/2020 0841   ALT 30 05/09/2017 0910   BILITOT 0.4 04/14/2020 0841   BILITOT 0.48 05/09/2017 0910      Impression and Plan: Jacob Bradford is a very pleasant 59 yo caucasian  gentleman with secondary polycythemia.  We will definitely phlebotomize him today.  I want to make sure that his hematocrit is below 45%.  He is taking aspirin so this is certainly encouraging.  I do think that if he lost weight, this  would help.  I am sure he has an element of sleep apnea which probably is increasing his hemoglobin and hematocrit..  We will now get him through the holiday season.  I will plan to see him back next year.  Volanda Napoleon, MD 11/11/20219:19 AM

## 2020-05-09 ENCOUNTER — Other Ambulatory Visit: Payer: Self-pay | Admitting: Hematology & Oncology

## 2020-05-09 MED ORDER — MORPHINE SULFATE 30 MG PO TABS: ORAL_TABLET | ORAL | 0 refills | Status: DC

## 2020-05-09 MED ORDER — MORPHINE SULFATE ER 60 MG PO TBCR: EXTENDED_RELEASE_TABLET | ORAL | 0 refills | Status: DC

## 2020-05-11 DIAGNOSIS — F419 Anxiety disorder, unspecified: Secondary | ICD-10-CM | POA: Diagnosis not present

## 2020-05-11 DIAGNOSIS — F411 Generalized anxiety disorder: Secondary | ICD-10-CM | POA: Diagnosis not present

## 2020-05-20 DIAGNOSIS — Z13228 Encounter for screening for other metabolic disorders: Secondary | ICD-10-CM | POA: Diagnosis not present

## 2020-05-20 DIAGNOSIS — Z1159 Encounter for screening for other viral diseases: Secondary | ICD-10-CM | POA: Diagnosis not present

## 2020-05-20 DIAGNOSIS — R739 Hyperglycemia, unspecified: Secondary | ICD-10-CM | POA: Diagnosis not present

## 2020-05-20 DIAGNOSIS — Z125 Encounter for screening for malignant neoplasm of prostate: Secondary | ICD-10-CM | POA: Diagnosis not present

## 2020-05-20 DIAGNOSIS — E785 Hyperlipidemia, unspecified: Secondary | ICD-10-CM | POA: Diagnosis not present

## 2020-05-31 DIAGNOSIS — E781 Pure hyperglyceridemia: Secondary | ICD-10-CM | POA: Diagnosis not present

## 2020-05-31 DIAGNOSIS — I10 Essential (primary) hypertension: Secondary | ICD-10-CM | POA: Diagnosis not present

## 2020-05-31 DIAGNOSIS — Z0000 Encounter for general adult medical examination without abnormal findings: Secondary | ICD-10-CM | POA: Diagnosis not present

## 2020-05-31 DIAGNOSIS — F419 Anxiety disorder, unspecified: Secondary | ICD-10-CM | POA: Diagnosis not present

## 2020-05-31 DIAGNOSIS — Z191 Hormone sensitive malignancy status: Secondary | ICD-10-CM | POA: Diagnosis not present

## 2020-05-31 DIAGNOSIS — Z1211 Encounter for screening for malignant neoplasm of colon: Secondary | ICD-10-CM | POA: Diagnosis not present

## 2020-05-31 DIAGNOSIS — Z23 Encounter for immunization: Secondary | ICD-10-CM | POA: Diagnosis not present

## 2020-06-07 ENCOUNTER — Other Ambulatory Visit: Payer: Self-pay | Admitting: Hematology & Oncology

## 2020-06-07 MED ORDER — MORPHINE SULFATE ER 60 MG PO TBCR: EXTENDED_RELEASE_TABLET | ORAL | 0 refills | Status: DC

## 2020-06-07 MED ORDER — MORPHINE SULFATE 30 MG PO TABS: ORAL_TABLET | ORAL | 0 refills | Status: DC

## 2020-06-23 ENCOUNTER — Telehealth: Payer: Self-pay | Admitting: Hematology & Oncology

## 2020-06-23 ENCOUNTER — Inpatient Hospital Stay (HOSPITAL_BASED_OUTPATIENT_CLINIC_OR_DEPARTMENT_OTHER): Payer: Medicare HMO | Admitting: Hematology & Oncology

## 2020-06-23 ENCOUNTER — Inpatient Hospital Stay: Payer: Medicare HMO | Attending: Hematology & Oncology

## 2020-06-23 ENCOUNTER — Encounter: Payer: Self-pay | Admitting: Hematology & Oncology

## 2020-06-23 ENCOUNTER — Inpatient Hospital Stay: Payer: Medicare HMO

## 2020-06-23 ENCOUNTER — Other Ambulatory Visit: Payer: Self-pay

## 2020-06-23 VITALS — BP 159/81 | HR 91 | Temp 98.3°F | Resp 18 | Wt 272.0 lb

## 2020-06-23 DIAGNOSIS — D45 Polycythemia vera: Secondary | ICD-10-CM | POA: Insufficient documentation

## 2020-06-23 LAB — IRON AND TIBC
Iron: 151 ug/dL (ref 42–163)
Saturation Ratios: 25 % (ref 20–55)
TIBC: 613 ug/dL — ABNORMAL HIGH (ref 202–409)
UIBC: 462 ug/dL — ABNORMAL HIGH (ref 117–376)

## 2020-06-23 LAB — CMP (CANCER CENTER ONLY)
ALT: 46 U/L — ABNORMAL HIGH (ref 0–44)
AST: 35 U/L (ref 15–41)
Albumin: 4.1 g/dL (ref 3.5–5.0)
Alkaline Phosphatase: 76 U/L (ref 38–126)
Anion gap: 8 (ref 5–15)
BUN: 23 mg/dL — ABNORMAL HIGH (ref 6–20)
CO2: 29 mmol/L (ref 22–32)
Calcium: 9.7 mg/dL (ref 8.9–10.3)
Chloride: 98 mmol/L (ref 98–111)
Creatinine: 1.25 mg/dL — ABNORMAL HIGH (ref 0.61–1.24)
GFR, Estimated: >60 mL/min (ref >60)
Glucose, Bld: 174 mg/dL — ABNORMAL HIGH (ref 70–99)
Potassium: 4.1 mmol/L (ref 3.5–5.1)
Sodium: 135 mmol/L (ref 135–145)
Total Bilirubin: 0.7 mg/dL (ref 0.3–1.2)
Total Protein: 7.6 g/dL (ref 6.5–8.1)

## 2020-06-23 LAB — CBC WITH DIFFERENTIAL (CANCER CENTER ONLY)
Abs Immature Granulocytes: 0.03 10*3/uL (ref 0.00–0.07)
Basophils Absolute: 0.1 10*3/uL (ref 0.0–0.1)
Basophils Relative: 1 %
Eosinophils Absolute: 0.2 10*3/uL (ref 0.0–0.5)
Eosinophils Relative: 3 %
HCT: 46.5 % (ref 39.0–52.0)
Hemoglobin: 15.3 g/dL (ref 13.0–17.0)
Immature Granulocytes: 0 %
Lymphocytes Relative: 18 %
Lymphs Abs: 1.3 10*3/uL (ref 0.7–4.0)
MCH: 27.7 pg (ref 26.0–34.0)
MCHC: 32.9 g/dL (ref 30.0–36.0)
MCV: 84.1 fL (ref 80.0–100.0)
Monocytes Absolute: 0.5 10*3/uL (ref 0.1–1.0)
Monocytes Relative: 7 %
Neutro Abs: 5.3 10*3/uL (ref 1.7–7.7)
Neutrophils Relative %: 71 %
Platelet Count: 206 10*3/uL (ref 150–400)
RBC: 5.53 MIL/uL (ref 4.22–5.81)
RDW: 14.6 % (ref 11.5–15.5)
WBC Count: 7.4 10*3/uL (ref 4.0–10.5)
nRBC: 0 % (ref 0.0–0.2)

## 2020-06-23 LAB — FERRITIN: Ferritin: 34 ng/mL (ref 24–336)

## 2020-06-23 NOTE — Telephone Encounter (Signed)
Appointments scheduled patient has My Chart for updates per 1/20 los

## 2020-06-23 NOTE — Progress Notes (Signed)
Hematology and Oncology Follow Up Visit  Jacob Bradford [id number] 03-31-1961 60 y.o. 06/23/2020   Principle Diagnosis:  Polycythemia vera - JAK2 negative Chronic low back pain secondary to degenerative disc disease - status post spinal fusion at L4-5 and L5-S1 Traumatic ligament damage to the right thumb and right knee  Current Therapy:   Phlebotomy to maintain hematocrit below 45% Aspirin 81 mg by mouth daily   Interim History:  Jacob Bradford is here today for follow-up.  He is under quite a bit of stress.  Unfortunately, his son who is not working and does not really want to work, is it causing issues.  He is having some legal trouble.  Unfortunately, Jacob Bradford is 1 who has to deal with all of this.  I just feel bad for him.  His weight is still up.  Hopefully, he will be able to lose a little bit of weight.  Is on cholesterol medicine now.  He is doing fairly well with this.  He does see his family doctor.  He does need to be phlebotomized today.  He has had no fever.  He has been very cautious with the coronavirus.  Hopefully, he will just move out of his house.  His son is staying there.  I think he just moved out, his son would be forced to get a job.  His pain is under fairly good control.  He has had no issues with nausea or vomiting.  He has had no issues with bowels or bladder.  He says he goes for a colonoscopy and March.  Overall, I would say his performance status is ECOG 1.    Medications:  Allergies as of 06/23/2020   No Known Allergies     Medication List       Accurate as of June 23, 2020  9:39 AM. If you have any questions, ask your nurse or doctor.        acetaminophen 500 MG tablet Commonly known as: TYLENOL Take 1,000 mg by mouth every 6 (six) hours as needed for moderate pain.   ALPRAZolam 1 MG tablet Commonly known as: XANAX Take 1 tablet (1 mg total) by mouth 3 (three) times daily as needed for anxiety. What changed: how much to  take   aspirin 81 MG tablet Take 1 tablet (81 mg total) by mouth daily.   esomeprazole 40 MG capsule Commonly known as: NEXIUM Take 40 mg by mouth daily at 12 noon.   gemfibrozil 600 MG tablet Commonly known as: LOPID Take 600 mg by mouth 2 (two) times daily.   lisinopril 40 MG tablet Commonly known as: ZESTRIL Take 40 mg by mouth daily.   losartan-hydrochlorothiazide 100-12.5 MG tablet Commonly known as: HYZAAR Take 1 tablet by mouth daily.   morphine 60 MG 12 hr tablet Commonly known as: MS CONTIN TAKE  (1)  TABLET  EVERY TWELVE HOURS.   morphine 30 MG tablet Commonly known as: MSIR TAKE  (1) OR (2)  TABLETS EVERY SIX HOURS AS NEEDED FOR PAIN.   - MAY MAKE DROWSY -   omeprazole 20 MG capsule Commonly known as: PRILOSEC Take 20 mg by mouth daily.   polyethylene glycol 17 g packet Commonly known as: MIRALAX / GLYCOLAX Take 17 g by mouth daily as needed for moderate constipation.       Allergies:  No Known Allergies  Past Medical History, Surgical history, Social history, and Family History were reviewed and updated.  Review of Systems: Review of Systems  Constitutional: Negative.   HENT: Negative.   Eyes: Negative.   Respiratory: Negative.   Cardiovascular: Negative.   Gastrointestinal: Negative.   Genitourinary: Negative.   Musculoskeletal: Positive for back pain and neck pain.  Skin: Negative.   Neurological: Negative.   Endo/Heme/Allergies: Negative.   Psychiatric/Behavioral: Negative.      Physical Exam:  weight is 272 lb (123.4 kg). His oral temperature is 98.3 F (36.8 C). His blood pressure is 159/81 (abnormal) and his pulse is 91. His respiration is 18 and oxygen saturation is 97%.   Wt Readings from Last 3 Encounters:  06/23/20 272 lb (123.4 kg)  04/14/20 275 lb (124.7 kg)  02/04/20 263 lb (119.3 kg)    Physical Exam Vitals reviewed.  HENT:     Head: Normocephalic and atraumatic.  Eyes:     Pupils: Pupils are equal, round, and  reactive to light.  Cardiovascular:     Rate and Rhythm: Normal rate and regular rhythm.     Heart sounds: Normal heart sounds.  Pulmonary:     Effort: Pulmonary effort is normal.     Breath sounds: Normal breath sounds.  Abdominal:     General: Bowel sounds are normal.     Palpations: Abdomen is soft.  Musculoskeletal:        General: No tenderness or deformity. Normal range of motion.     Cervical back: Normal range of motion.  Lymphadenopathy:     Cervical: No cervical adenopathy.  Skin:    General: Skin is warm and dry.     Findings: No erythema or rash.  Neurological:     Mental Status: He is alert and oriented to person, place, and time.  Psychiatric:        Behavior: Behavior normal.        Thought Content: Thought content normal.        Judgment: Judgment normal.      Lab Results  Component Value Date   WBC 7.4 06/23/2020   HGB 15.3 06/23/2020   HCT 46.5 06/23/2020   MCV 84.1 06/23/2020   PLT 206 06/23/2020   Lab Results  Component Value Date   FERRITIN 55 04/14/2020   IRON 126 04/14/2020   TIBC 488 (H) 04/14/2020   UIBC 361 04/14/2020   IRONPCTSAT 26 04/14/2020   Lab Results  Component Value Date   RETICCTPCT 1.3 06/13/2018   RBC 5.53 06/23/2020   RETICCTABS 77.0 02/22/2011   No results found for: KPAFRELGTCHN, LAMBDASER, KAPLAMBRATIO No results found for: IGGSERUM, IGA, IGMSERUM No results found for: Odetta Pink, SPEI   Chemistry      Component Value Date/Time   NA 135 06/23/2020 0847   NA 141 05/09/2017 0910   K 4.1 06/23/2020 0847   K 4.2 05/09/2017 0910   CL 98 06/23/2020 0847   CL 105 11/13/2016 1123   CO2 29 06/23/2020 0847   CO2 24 05/09/2017 0910   BUN 23 (H) 06/23/2020 0847   BUN 15.2 05/09/2017 0910   CREATININE 1.25 (H) 06/23/2020 0847   CREATININE 1.2 05/09/2017 0910      Component Value Date/Time   CALCIUM 9.7 06/23/2020 0847   CALCIUM 9.1 05/09/2017 0910   ALKPHOS 76  06/23/2020 0847   ALKPHOS 96 05/09/2017 0910   AST 35 06/23/2020 0847   AST 22 05/09/2017 0910   ALT 46 (H) 06/23/2020 0847   ALT 30 05/09/2017 0910   BILITOT 0.7 06/23/2020 0847   BILITOT 0.48 05/09/2017  0910      Impression and Plan: Jacob Bradford is a very pleasant 60 yo caucasian gentleman with secondary polycythemia.  We will definitely phlebotomize him today.  I want to make sure that his hematocrit is below 45%.  He is taking aspirin so this is certainly encouraging.  I do think that if he lost weight, this would help.  I think his hemoglobin would improve with weight loss.  It would not surprise me if he has an element of sleep apnea.  We will now plan to get him back to see [place] in another 6 weeks.     Volanda Napoleon, MD 1/20/20229:39 AM

## 2020-06-23 NOTE — Patient Instructions (Signed)

## 2020-07-08 ENCOUNTER — Other Ambulatory Visit: Payer: Self-pay | Admitting: Hematology & Oncology

## 2020-07-08 MED ORDER — MORPHINE SULFATE 30 MG PO TABS: ORAL_TABLET | ORAL | 0 refills | Status: DC

## 2020-07-08 MED ORDER — MORPHINE SULFATE ER 60 MG PO TBCR: EXTENDED_RELEASE_TABLET | ORAL | 0 refills | Status: DC

## 2020-07-28 ENCOUNTER — Inpatient Hospital Stay: Payer: Medicare HMO | Admitting: Hematology & Oncology

## 2020-07-28 ENCOUNTER — Other Ambulatory Visit: Payer: Self-pay

## 2020-07-28 ENCOUNTER — Inpatient Hospital Stay: Payer: Medicare HMO

## 2020-07-28 ENCOUNTER — Inpatient Hospital Stay: Payer: Medicare HMO | Attending: Hematology & Oncology

## 2020-07-28 ENCOUNTER — Encounter: Payer: Self-pay | Admitting: Hematology & Oncology

## 2020-07-28 VITALS — BP 100/61 | HR 68

## 2020-07-28 VITALS — BP 143/65 | HR 87 | Temp 98.2°F | Resp 20 | Wt 280.0 lb

## 2020-07-28 DIAGNOSIS — D45 Polycythemia vera: Secondary | ICD-10-CM

## 2020-07-28 LAB — RETICULOCYTES
Immature Retic Fract: 14.7 % (ref 2.3–15.9)
RBC.: 5.22 MIL/uL (ref 4.22–5.81)
Retic Count, Absolute: 73.6 10*3/uL (ref 19.0–186.0)
Retic Ct Pct: 1.4 % (ref 0.4–3.1)

## 2020-07-28 LAB — CMP (CANCER CENTER ONLY)
ALT: 35 U/L (ref 0–44)
AST: 27 U/L (ref 15–41)
Albumin: 4 g/dL (ref 3.5–5.0)
Alkaline Phosphatase: 81 U/L (ref 38–126)
Anion gap: 8 (ref 5–15)
BUN: 14 mg/dL (ref 6–20)
CO2: 30 mmol/L (ref 22–32)
Calcium: 9.7 mg/dL (ref 8.9–10.3)
Chloride: 101 mmol/L (ref 98–111)
Creatinine: 1.19 mg/dL (ref 0.61–1.24)
GFR, Estimated: >60 mL/min (ref >60)
Glucose, Bld: 127 mg/dL — ABNORMAL HIGH (ref 70–99)
Potassium: 4.1 mmol/L (ref 3.5–5.1)
Sodium: 139 mmol/L (ref 135–145)
Total Bilirubin: 0.5 mg/dL (ref 0.3–1.2)
Total Protein: 7 g/dL (ref 6.5–8.1)

## 2020-07-28 LAB — CBC WITH DIFFERENTIAL (CANCER CENTER ONLY)
Abs Immature Granulocytes: 0.03 10*3/uL (ref 0.00–0.07)
Basophils Absolute: 0.1 10*3/uL (ref 0.0–0.1)
Basophils Relative: 1 %
Eosinophils Absolute: 0.4 10*3/uL (ref 0.0–0.5)
Eosinophils Relative: 6 %
HCT: 44.5 % (ref 39.0–52.0)
Hemoglobin: 14.2 g/dL (ref 13.0–17.0)
Immature Granulocytes: 0 %
Lymphocytes Relative: 23 %
Lymphs Abs: 1.6 10*3/uL (ref 0.7–4.0)
MCH: 27.4 pg (ref 26.0–34.0)
MCHC: 31.9 g/dL (ref 30.0–36.0)
MCV: 85.7 fL (ref 80.0–100.0)
Monocytes Absolute: 0.5 10*3/uL (ref 0.1–1.0)
Monocytes Relative: 7 %
Neutro Abs: 4.3 10*3/uL (ref 1.7–7.7)
Neutrophils Relative %: 63 %
Platelet Count: ADEQUATE 10*3/uL (ref 150–400)
RBC: 5.19 MIL/uL (ref 4.22–5.81)
RDW: 14 % (ref 11.5–15.5)
WBC Count: 6.9 10*3/uL (ref 4.0–10.5)
nRBC: 0 % (ref 0.0–0.2)

## 2020-07-28 LAB — IRON AND TIBC
Iron: 108 ug/dL (ref 42–163)
Saturation Ratios: 22 % (ref 20–55)
TIBC: 483 ug/dL — ABNORMAL HIGH (ref 202–409)
UIBC: 374 ug/dL (ref 117–376)

## 2020-07-28 LAB — FERRITIN: Ferritin: 31 ng/mL (ref 24–336)

## 2020-07-28 NOTE — Progress Notes (Signed)
Three unsuccessful sticks this am, both left and right antecubitals .  MMorris, RN

## 2020-07-28 NOTE — Patient Instructions (Signed)
Therapeutic Phlebotomy Therapeutic phlebotomy is the planned removal of blood from a person's body for the purpose of treating a medical condition. The procedure is similar to donating blood. Usually, about a pint (470 mL, or 0.47 L) of blood is removed. The average adult has 9-12 pints (4.3-5.7 L) of blood in the body. Therapeutic phlebotomy may be used to treat the following medical conditions:  Hemochromatosis. This is a condition in which the blood contains too much iron.  Polycythemia vera. This is a condition in which the blood contains too many red blood cells.  Porphyria cutanea tarda. This is a disease in which an important part of hemoglobin is not made properly. It results in the buildup of abnormal amounts of porphyrins in the body.  Sickle cell disease. This is a condition in which the red blood cells form an abnormal crescent shape rather than a round shape. Tell a health care provider about:  Any allergies you have.  All medicines you are taking, including vitamins, herbs, eye drops, creams, and over-the-counter medicines.  Any problems you or family members have had with anesthetic medicines.  Any blood disorders you have.  Any surgeries you have had.  Any medical conditions you have.  Whether you are pregnant or may be pregnant. What are the risks? Generally, this is a safe procedure. However, problems may occur, including:  Nausea or light-headedness.  Low blood pressure (hypotension).  Soreness, bleeding, swelling, or bruising at the needle insertion site.  Infection. What happens before the procedure?  Follow instructions from your health care provider about eating or drinking restrictions.  Ask your health care provider about: ? Changing or stopping your regular medicines. This is especially important if you are taking diabetes medicines or blood thinners (anticoagulants). ? Taking medicines such as aspirin and ibuprofen. These medicines can thin your  blood. Do not take these medicines unless your health care provider tells you to take them. ? Taking over-the-counter medicines, vitamins, herbs, and supplements.  Wear clothing with sleeves that can be raised above the elbow.  Plan to have someone take you home from the hospital or clinic.  You may have a blood sample taken.  Your blood pressure, pulse rate, and breathing rate will be measured. What happens during the procedure?  To lower your risk of infection: ? Your health care team will wash or sanitize their hands. ? Your skin will be cleaned with an antiseptic.  You may be given a medicine to numb the area (local anesthetic).  A tourniquet will be placed on your arm.  A needle will be inserted into one of your veins.  Tubing and a collection bag will be attached to that needle.  Blood will flow through the needle and tubing into the collection bag.  The collection bag will be placed lower than your arm to allow gravity to help the flow of blood into the bag.  You may be asked to open and close your hand slowly and continually during the entire collection.  After the specified amount of blood has been removed from your body, the collection bag and tubing will be clamped.  The needle will be removed from your vein.  Pressure will be held on the site of the needle insertion to stop the bleeding.  A bandage (dressing) will be placed over the needle insertion site. The procedure may vary among health care providers and hospitals.   What happens after the procedure?  Your blood pressure, pulse rate, and breathing rate will   be measured after the procedure.  You will be encouraged to drink fluids.  Your recovery will be assessed and monitored.  You can return to your normal activities as told by your health care provider. Summary  Therapeutic phlebotomy is the planned removal of blood from a person's body for the purpose of treating a medical condition.  Therapeutic  phlebotomy may be used to treat hemochromatosis, polycythemia vera, porphyria cutanea tarda, or sickle cell disease.  In the procedure, a needle is inserted and about a pint (470 mL, or 0.47 L) of blood is removed. The average adult has 9-12 pints (4.3-5.7 L) of blood in the body.  This is generally a safe procedure, but it can sometimes cause problems such as nausea, light-headedness, or low blood pressure (hypotension). This information is not intended to replace advice given to you by your health care provider. Make sure you discuss any questions you have with your health care provider. Document Revised: 06/06/2017 Document Reviewed: 06/06/2017 Elsevier Patient Education  2021 [employer]vier Inc.  
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
## 2020-07-28 NOTE — Progress Notes (Signed)
Jacob Bradford presents today for phlebotomy per MD orders. Phlebotomy procedure started at 1030 with 20 g angiocath and ended at 1100. 550 grams removed. Patient observed for 30 minutes after procedure without any incident. Patient tolerated procedure well. IV needle removed intact.

## 2020-07-29 ENCOUNTER — Telehealth: Payer: Self-pay | Admitting: *Deleted

## 2020-07-29 NOTE — Telephone Encounter (Signed)
Per los 07/28/20 - called patient and gave upcoming appts. - mailed calendar - view mychart

## 2020-08-05 ENCOUNTER — Other Ambulatory Visit: Payer: Self-pay

## 2020-08-05 ENCOUNTER — Other Ambulatory Visit: Payer: Self-pay | Admitting: Hematology & Oncology

## 2020-08-05 ENCOUNTER — Ambulatory Visit (AMBULATORY_SURGERY_CENTER): Payer: Self-pay | Admitting: *Deleted

## 2020-08-05 VITALS — Ht 72.0 in | Wt 274.0 lb

## 2020-08-05 DIAGNOSIS — Z1211 Encounter for screening for malignant neoplasm of colon: Secondary | ICD-10-CM

## 2020-08-05 MED ORDER — MORPHINE SULFATE ER 60 MG PO TBCR: EXTENDED_RELEASE_TABLET | ORAL | 0 refills | Status: DC

## 2020-08-05 MED ORDER — MORPHINE SULFATE 30 MG PO TABS: ORAL_TABLET | ORAL | 0 refills | Status: DC

## 2020-08-05 MED ORDER — SUPREP BOWEL PREP KIT 17.5-3.13-1.6 GM/177ML PO SOLN: 1.0000 | Freq: Once | ORAL | 0 refills | Status: AC

## 2020-08-05 NOTE — Progress Notes (Signed)
No egg or soy allergy known to patient  No issues with past sedation with any surgeries or procedures No intubation problems in the past  No FH of Malignant Hyperthermia No diet pills per patient No home 02 use per patient  No blood thinners per patient  Pt denies issues with constipation regularly- he uses Miralax PRN- doesn't eat every day and this affects stooling pattern  No A fib or A flutter  EMMI video to pt or via [place] 19 guidelines implemented in PV today with Pt and RN  Pt is not  vaccinated  for Covid    Due to the COVID-19 pandemic we are asking patients to follow certain guidelines.  Pt aware of COVID protocols and [facility] guidelines

## 2020-08-19 ENCOUNTER — Encounter: Payer: Self-pay | Admitting: Gastroenterology

## 2020-08-19 ENCOUNTER — Ambulatory Visit (AMBULATORY_SURGERY_CENTER): Payer: Medicare HMO | Admitting: Gastroenterology

## 2020-08-19 ENCOUNTER — Other Ambulatory Visit: Payer: Self-pay

## 2020-08-19 VITALS — BP 160/62 | HR 61 | Temp 98.9°F | Resp 18 | Ht 72.0 in | Wt 274.0 lb

## 2020-08-19 DIAGNOSIS — K621 Rectal polyp: Secondary | ICD-10-CM

## 2020-08-19 DIAGNOSIS — D128 Benign neoplasm of rectum: Secondary | ICD-10-CM

## 2020-08-19 DIAGNOSIS — Z86010 Personal history of colonic polyps: Secondary | ICD-10-CM

## 2020-08-19 MED ORDER — SODIUM CHLORIDE 0.9 % IV SOLN
500.0000 mL | Freq: Once | INTRAVENOUS | Status: DC
Start: 2020-08-19 — End: 2020-08-19

## 2020-08-19 NOTE — Addendum Note (Signed)
Addended by: Etheleen Nicks on: 08/19/2020 01:31 PM   Modules accepted: Orders

## 2020-08-19 NOTE — Patient Instructions (Signed)
YOU HAD AN ENDOSCOPIC PROCEDURE TODAY AT THE [facility]:   Refer to the procedure report that was given to you for any specific questions about what was found during the examination.  If the procedure report does not answer your questions, please call your gastroenterologist to clarify.  If you requested that your care partner not be given the details of your procedure findings, then the procedure report has been included in a sealed envelope for you to review at your convenience later.  YOU SHOULD EXPECT: Some feelings of bloating in the abdomen. Passage of more gas than usual.  Walking can help get rid of the air that was put into your GI tract during the procedure and reduce the bloating. If you had a lower endoscopy (such as a colonoscopy or flexible sigmoidoscopy) you may notice spotting of blood in your stool or on the toilet paper. If you underwent a bowel prep for your procedure, you may not have a normal bowel movement for a few days.  Please Note:  You might notice some irritation and congestion in your nose or some drainage.  This is from the oxygen used during your procedure.  There is no need for concern and it should clear up in a day or so.  SYMPTOMS TO REPORT IMMEDIATELY:   Following lower endoscopy (colonoscopy or flexible sigmoidoscopy):  Excessive amounts of blood in the stool  Significant tenderness or worsening of abdominal pains  Swelling of the abdomen that is new, acute  Fever of 100F or higher   Following upper endoscopy (EGD)  Vomiting of blood or coffee ground material  New chest pain or pain under the shoulder blades  Painful or persistently difficult swallowing  New shortness of breath  Fever of 100F or higher  Black, tarry-looking stools  For urgent or emergent issues, a gastroenterologist can be reached at any hour by calling [phone]. Do not use MyChart messaging for urgent concerns.    DIET:  We do recommend a small meal at first, but  then you may proceed to your regular diet.  Drink plenty of fluids but you should avoid alcoholic beverages for 24 hours.  ACTIVITY:  You should plan to take it easy for the rest of today and you should NOT DRIVE or use heavy machinery until tomorrow (because of the sedation medicines used during the test).    FOLLOW UP: Our staff will call the number listed on your records 48-72 hours following your procedure to check on you and address any questions or concerns that you may have regarding the information given to you following your procedure. If we do not reach you, we will leave a message.  We will attempt to reach you two times.  During this call, we will ask if you have developed any symptoms of COVID 19. If you develop any symptoms (ie: fever, flu-like symptoms, shortness of breath, cough etc.) before then, please call [phone].  If you test positive for Covid 19 in the 2 weeks post procedure, please call and report this information to us.    If any biopsies were taken you will be contacted by phone or by letter within the next 1-3 weeks.  Please call us at [phone] if you have not heard about the biopsies in 3 weeks.    SIGNATURES/CONFIDENTIALITY: You and/or your care partner have signed paperwork which will be entered into your electronic medical record.  These signatures attest to the fact that that the information above on   your After Visit Summary has been reviewed and is understood.  Full responsibility of the confidentiality of this discharge information lies with you and/or your care-partner. 

## 2020-08-19 NOTE — Op Note (Signed)
[facility] Patient Name: Jacob Bradford Procedure Date: 08/19/2020 11:01 AM MRN: [id number] Endoscopist: Remo Lipps P. Havery [name] , MD Age: 60 Referring MD:  Date of Birth: Apr 17, 1961 Gender: Male Account #: [id number] Procedure:                Colonoscopy Indications:              High risk colon cancer surveillance: Personal                            history of colonic polyps - reported polyps removed                            at age 27, time of last exam Medicines:                Monitored Anesthesia Care Procedure:                Pre-Anesthesia Assessment:                           - Prior to the procedure, a History and Physical                            was performed, and patient medications and                            allergies were reviewed. The patient's tolerance of                            previous anesthesia was also reviewed. The risks                            and benefits of the procedure and the sedation                            options and risks were discussed with the patient.                            All questions were answered, and informed consent                            was obtained. Prior Anticoagulants: The patient has                            taken no previous anticoagulant or antiplatelet                            agents. ASA Grade Assessment: III - A patient with                            severe systemic disease. After reviewing the risks                            and benefits, the patient was deemed in  satisfactory condition to undergo the procedure.                           After obtaining informed consent, the colonoscope                            was passed under direct vision. Throughout the                            procedure, the patient's blood pressure, pulse, and                            oxygen saturations were monitored continuously. The                            Olympus CF-HQ190L  [id number]) Colonoscope was                            introduced through the anus and advanced to the the                            cecum, identified by appendiceal orifice and                            ileocecal valve. The colonoscopy was performed                            without difficulty. The patient tolerated the                            procedure well. The quality of the bowel                            preparation was adequate. The ileocecal valve,                            appendiceal orifice, and rectum were photographed. Scope In: 11:05:58 AM Scope Out: 11:23:54 AM Scope Withdrawal Time: 0 hours 14 minutes 17 seconds  Total Procedure Duration: 0 hours 17 minutes 56 seconds  Findings:                 The perianal and digital rectal examinations were                            normal.                           Multiple small-mouthed diverticula were found in                            the sigmoid colon.                           A 3 mm polyp was found in the rectum. The polyp was  sessile. The polyp was removed with a cold snare.                            Resection and retrieval were complete.                           Internal hemorrhoids were found during retroflexion.                           The exam was otherwise without abnormality. Complications:            No immediate complications. Estimated blood loss:                            Minimal. Estimated Blood Loss:     Estimated blood loss was minimal. Impression:               - Diverticulosis in the sigmoid colon.                           - One 3 mm polyp in the rectum, removed with a cold                            snare. Resected and retrieved.                           - Internal hemorrhoids.                           - The examination was otherwise normal. Recommendation:           - Patient has a contact number available for                            emergencies. The signs and  symptoms of potential                            delayed complications were discussed with the                            patient. Return to normal activities tomorrow.                            Written discharge instructions were provided to the                            patient.                           - Resume previous diet.                           - Continue present medications.                           - Await pathology results. Remo Lipps P. Jodi Kappes, MD 08/19/2020 11:32:00 AM This report has been signed electronically.

## 2020-08-19 NOTE — Progress Notes (Signed)
error 

## 2020-08-19 NOTE — Progress Notes (Signed)
VS by CW  I have reviewed the patient's medical history in detail and updated the computerized patient record.  

## 2020-08-19 NOTE — Progress Notes (Signed)
To Pacu, VSS. Report to [email or web address] 

## 2020-08-23 ENCOUNTER — Telehealth: Payer: Self-pay | Admitting: *Deleted

## 2020-08-23 NOTE — Telephone Encounter (Signed)
  Follow up Call-  Call back number 08/19/2020  Post procedure Call Back phone  # [phone]  Permission to leave phone message Yes  Some recent data might be hidden     Patient questions:  Do you have a fever, pain , or abdominal swelling? No. Pain Score  0 *  Have you tolerated food without any problems? Yes.    Have you been able to return to your normal activities? Yes.    Do you have any questions about your discharge instructions: Diet   No. Medications  No. Follow up visit  No.  Do you have questions or concerns about your Care? No.  Actions: * If pain score is 4 or above: 1. No action needed, pain <4.Have you developed a fever since your procedure? no  2.   Have you had an respiratory symptoms (SOB or cough) since your procedure? no  3.   Have you tested positive for COVID 19 since your procedure no  4.   Have you had any family members/close contacts diagnosed with the COVID 19 since your procedure?  no   If yes to any of these questions please route to Joylene John, RN and Joella Prince, RN

## 2020-09-05 ENCOUNTER — Other Ambulatory Visit: Payer: Self-pay | Admitting: Hematology & Oncology

## 2020-09-05 DIAGNOSIS — D45 Polycythemia vera: Secondary | ICD-10-CM

## 2020-09-26 ENCOUNTER — Inpatient Hospital Stay: Payer: Medicare HMO

## 2020-09-26 ENCOUNTER — Inpatient Hospital Stay (HOSPITAL_BASED_OUTPATIENT_CLINIC_OR_DEPARTMENT_OTHER): Payer: Medicare HMO | Admitting: Hematology & Oncology

## 2020-09-26 ENCOUNTER — Telehealth: Payer: Self-pay

## 2020-09-26 ENCOUNTER — Other Ambulatory Visit: Payer: Self-pay

## 2020-09-26 ENCOUNTER — Inpatient Hospital Stay: Payer: Medicare HMO | Attending: Hematology & Oncology

## 2020-09-26 ENCOUNTER — Encounter: Payer: Self-pay | Admitting: Hematology & Oncology

## 2020-09-26 VITALS — BP 138/82 | HR 84 | Temp 98.1°F | Resp 19 | Wt 297.0 lb

## 2020-09-26 DIAGNOSIS — M5136 Other intervertebral disc degeneration, lumbar region: Secondary | ICD-10-CM | POA: Diagnosis not present

## 2020-09-26 DIAGNOSIS — Z7982 Long term (current) use of aspirin: Secondary | ICD-10-CM | POA: Insufficient documentation

## 2020-09-26 DIAGNOSIS — D45 Polycythemia vera: Secondary | ICD-10-CM

## 2020-09-26 DIAGNOSIS — Z79899 Other long term (current) drug therapy: Secondary | ICD-10-CM | POA: Insufficient documentation

## 2020-09-26 DIAGNOSIS — M71461 Calcium deposit in bursa, right knee: Secondary | ICD-10-CM | POA: Diagnosis not present

## 2020-09-26 DIAGNOSIS — M25561 Pain in right knee: Secondary | ICD-10-CM | POA: Diagnosis not present

## 2020-09-26 LAB — CBC WITH DIFFERENTIAL (CANCER CENTER ONLY)
Abs Immature Granulocytes: 0.03 10*3/uL (ref 0.00–0.07)
Basophils Absolute: 0.1 10*3/uL (ref 0.0–0.1)
Basophils Relative: 1 %
Eosinophils Absolute: 0.4 10*3/uL (ref 0.0–0.5)
Eosinophils Relative: 6 %
HCT: 40.4 % (ref 39.0–52.0)
Hemoglobin: 12.7 g/dL — ABNORMAL LOW (ref 13.0–17.0)
Immature Granulocytes: 0 %
Lymphocytes Relative: 30 %
Lymphs Abs: 2.1 10*3/uL (ref 0.7–4.0)
MCH: 27.1 pg (ref 26.0–34.0)
MCHC: 31.4 g/dL (ref 30.0–36.0)
MCV: 86.3 fL (ref 80.0–100.0)
Monocytes Absolute: 0.5 10*3/uL (ref 0.1–1.0)
Monocytes Relative: 8 %
Neutro Abs: 3.7 10*3/uL (ref 1.7–7.7)
Neutrophils Relative %: 55 %
Platelet Count: 240 10*3/uL (ref 150–400)
RBC: 4.68 MIL/uL (ref 4.22–5.81)
RDW: 13.6 % (ref 11.5–15.5)
WBC Count: 6.9 10*3/uL (ref 4.0–10.5)
nRBC: 0 % (ref 0.0–0.2)

## 2020-09-26 LAB — CMP (CANCER CENTER ONLY)
ALT: 18 U/L (ref 0–44)
AST: 15 U/L (ref 15–41)
Albumin: 3.7 g/dL (ref 3.5–5.0)
Alkaline Phosphatase: 68 U/L (ref 38–126)
Anion gap: 7 (ref 5–15)
BUN: 14 mg/dL (ref 6–20)
CO2: 33 mmol/L — ABNORMAL HIGH (ref 22–32)
Calcium: 8.9 mg/dL (ref 8.9–10.3)
Chloride: 100 mmol/L (ref 98–111)
Creatinine: 1.1 mg/dL (ref 0.61–1.24)
GFR, Estimated: >60 mL/min (ref >60)
Glucose, Bld: 124 mg/dL — ABNORMAL HIGH (ref 70–99)
Potassium: 4.2 mmol/L (ref 3.5–5.1)
Sodium: 140 mmol/L (ref 135–145)
Total Bilirubin: 0.3 mg/dL (ref 0.3–1.2)
Total Protein: 6.7 g/dL (ref 6.5–8.1)

## 2020-09-26 LAB — IRON AND TIBC
Iron: 52 ug/dL (ref 42–163)
Saturation Ratios: 10 % — ABNORMAL LOW (ref 20–55)
TIBC: 501 ug/dL — ABNORMAL HIGH (ref 202–409)
UIBC: 449 ug/dL — ABNORMAL HIGH (ref 117–376)

## 2020-09-26 LAB — FERRITIN: Ferritin: 10 ng/mL — ABNORMAL LOW (ref 24–336)

## 2020-09-26 NOTE — Telephone Encounter (Signed)
appts made per verbal los for 38mo f/u, calendar printed for pt   Jacob Bradford
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
## 2020-10-05 ENCOUNTER — Other Ambulatory Visit: Payer: Self-pay | Admitting: Hematology & Oncology

## 2020-10-05 DIAGNOSIS — D45 Polycythemia vera: Secondary | ICD-10-CM

## 2020-10-05 MED ORDER — MORPHINE SULFATE ER 60 MG PO TBCR: EXTENDED_RELEASE_TABLET | ORAL | 0 refills | Status: DC

## 2020-10-05 MED ORDER — MORPHINE SULFATE 30 MG PO TABS: ORAL_TABLET | ORAL | 0 refills | Status: DC

## 2020-11-03 ENCOUNTER — Other Ambulatory Visit (HOSPITAL_COMMUNITY): Payer: Self-pay | Admitting: Hematology & Oncology

## 2020-11-03 DIAGNOSIS — D45 Polycythemia vera: Secondary | ICD-10-CM

## 2020-11-03 MED ORDER — MORPHINE SULFATE 30 MG PO TABS: ORAL_TABLET | ORAL | 0 refills | Status: DC

## 2020-11-03 MED ORDER — MORPHINE SULFATE ER 60 MG PO TBCR: EXTENDED_RELEASE_TABLET | ORAL | 0 refills | Status: DC

## 2020-11-03 NOTE — Progress Notes (Signed)
Refill

## 2020-11-22 DIAGNOSIS — F419 Anxiety disorder, unspecified: Secondary | ICD-10-CM | POA: Diagnosis not present

## 2020-11-22 DIAGNOSIS — I10 Essential (primary) hypertension: Secondary | ICD-10-CM | POA: Diagnosis not present

## 2020-11-30 ENCOUNTER — Other Ambulatory Visit (INDEPENDENT_AMBULATORY_CARE_PROVIDER_SITE_OTHER): Payer: Self-pay | Admitting: Hematology & Oncology

## 2020-11-30 DIAGNOSIS — D45 Polycythemia vera: Secondary | ICD-10-CM

## 2020-12-01 ENCOUNTER — Other Ambulatory Visit: Payer: Self-pay | Admitting: Hematology & Oncology

## 2020-12-01 DIAGNOSIS — D45 Polycythemia vera: Secondary | ICD-10-CM

## 2020-12-01 MED ORDER — MORPHINE SULFATE ER 60 MG PO TBCR: EXTENDED_RELEASE_TABLET | ORAL | 0 refills | Status: DC

## 2020-12-01 MED ORDER — MORPHINE SULFATE 30 MG PO TABS: ORAL_TABLET | ORAL | 0 refills | Status: DC

## 2020-12-28 ENCOUNTER — Inpatient Hospital Stay: Payer: Medicare HMO | Attending: Hematology & Oncology

## 2020-12-28 ENCOUNTER — Other Ambulatory Visit: Payer: Self-pay

## 2020-12-28 ENCOUNTER — Inpatient Hospital Stay: Payer: Medicare HMO

## 2020-12-28 ENCOUNTER — Encounter: Payer: Self-pay | Admitting: Hematology & Oncology

## 2020-12-28 ENCOUNTER — Telehealth: Payer: Self-pay

## 2020-12-28 ENCOUNTER — Inpatient Hospital Stay: Payer: Medicare HMO | Admitting: Hematology & Oncology

## 2020-12-28 DIAGNOSIS — D45 Polycythemia vera: Secondary | ICD-10-CM | POA: Diagnosis not present

## 2020-12-28 LAB — IRON AND TIBC
Iron: 66 ug/dL (ref 42–163)
Saturation Ratios: 14 % — ABNORMAL LOW (ref 20–55)
TIBC: 485 ug/dL — ABNORMAL HIGH (ref 202–409)
UIBC: 419 ug/dL — ABNORMAL HIGH (ref 117–376)

## 2020-12-28 LAB — CMP (CANCER CENTER ONLY)
ALT: 32 U/L (ref 0–44)
AST: 23 U/L (ref 15–41)
Albumin: 4.3 g/dL (ref 3.5–5.0)
Alkaline Phosphatase: 90 U/L (ref 38–126)
Anion gap: 10 (ref 5–15)
BUN: 9 mg/dL (ref 6–20)
CO2: 29 mmol/L (ref 22–32)
Calcium: 9.7 mg/dL (ref 8.9–10.3)
Chloride: 99 mmol/L (ref 98–111)
Creatinine: 1.08 mg/dL (ref 0.61–1.24)
GFR, Estimated: >60 mL/min (ref >60)
Glucose, Bld: 126 mg/dL — ABNORMAL HIGH (ref 70–99)
Potassium: 3.7 mmol/L (ref 3.5–5.1)
Sodium: 138 mmol/L (ref 135–145)
Total Bilirubin: 0.7 mg/dL (ref 0.3–1.2)
Total Protein: 7.8 g/dL (ref 6.5–8.1)

## 2020-12-28 LAB — CBC WITH DIFFERENTIAL (CANCER CENTER ONLY)
Abs Immature Granulocytes: 0.04 10*3/uL (ref 0.00–0.07)
Basophils Absolute: 0.1 10*3/uL (ref 0.0–0.1)
Basophils Relative: 1 %
Eosinophils Absolute: 0.2 10*3/uL (ref 0.0–0.5)
Eosinophils Relative: 3 %
HCT: 49.8 % (ref 39.0–52.0)
Hemoglobin: 15.9 g/dL (ref 13.0–17.0)
Immature Granulocytes: 0 %
Lymphocytes Relative: 19 %
Lymphs Abs: 1.8 10*3/uL (ref 0.7–4.0)
MCH: 26.5 pg (ref 26.0–34.0)
MCHC: 31.9 g/dL (ref 30.0–36.0)
MCV: 83.1 fL (ref 80.0–100.0)
Monocytes Absolute: 0.6 10*3/uL (ref 0.1–1.0)
Monocytes Relative: 7 %
Neutro Abs: 6.8 10*3/uL (ref 1.7–7.7)
Neutrophils Relative %: 70 %
Platelet Count: 247 10*3/uL (ref 150–400)
RBC: 5.99 MIL/uL — ABNORMAL HIGH (ref 4.22–5.81)
RDW: 14.8 % (ref 11.5–15.5)
WBC Count: 9.6 10*3/uL (ref 4.0–10.5)
nRBC: 0 % (ref 0.0–0.2)

## 2020-12-28 LAB — LACTATE DEHYDROGENASE: LDH: 160 U/L (ref 98–192)

## 2020-12-28 LAB — FERRITIN: Ferritin: 22 ng/mL — ABNORMAL LOW (ref 24–336)

## 2020-12-28 MED ORDER — MORPHINE SULFATE ER 60 MG PO TBCR
EXTENDED_RELEASE_TABLET | ORAL | 0 refills | Status: DC
Start: 2020-12-28 — End: 2021-01-24

## 2020-12-28 MED ORDER — MORPHINE SULFATE 30 MG PO TABS
ORAL_TABLET | ORAL | 0 refills | Status: DC
Start: 2020-12-28 — End: 2021-01-24

## 2020-12-28 NOTE — Progress Notes (Signed)
Jacob Bradford presents today for phlebotomy per MD orders. Phlebotomy procedure started at 1021 and ended at 1029. 550 grams removed. Patient refused to be observed for 30 minutes after procedure.VSS  Patient tolerated procedure well. IV needle removed intact.
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
## 2020-12-28 NOTE — Patient Instructions (Signed)
Therapeutic Phlebotomy Therapeutic phlebotomy is the planned removal of blood from a person's body for the purpose of treating a medical condition. The procedure is similar to donating blood. Usually, about a pint (470 mL, or 0.47 L) of blood is removed.The average adult has 9-12 pints (4.3-5.7 L) of blood in the body. Therapeutic phlebotomy may be used to treat the following medical conditions: Hemochromatosis. This is a condition in which the blood contains too much iron. Polycythemia vera. This is a condition in which the blood contains too many red blood cells. Porphyria cutanea tarda. This is a disease in which an important part of hemoglobin is not made properly. It results in the buildup of abnormal amounts of porphyrins in the body. Sickle cell disease. This is a condition in which the red blood cells form an abnormal crescent shape rather than a round shape. Tell a health care provider about: Any allergies you have. All medicines you are taking, including vitamins, herbs, eye drops, creams, and over-the-counter medicines. Any problems you or family members have had with anesthetic medicines. Any blood disorders you have. Any surgeries you have had. Any medical conditions you have. Whether you are pregnant or may be pregnant. What are the risks? Generally, this is a safe procedure. However, problems may occur, including: Nausea or light-headedness. Low blood pressure (hypotension). Soreness, bleeding, swelling, or bruising at the needle insertion site. Infection. What happens before the procedure? Follow instructions from your health care provider about eating or drinking restrictions. Ask your health care provider about: Changing or stopping your regular medicines. This is especially important if you are taking diabetes medicines or blood thinners (anticoagulants). Taking medicines such as aspirin and ibuprofen. These medicines can thin your blood. Do not take these medicines unless  your health care provider tells you to take them. Taking over-the-counter medicines, vitamins, herbs, and supplements. Wear clothing with sleeves that can be raised above the elbow. Plan to have someone take you home from the hospital or clinic. You may have a blood sample taken. Your blood pressure, pulse rate, and breathing rate will be measured. What happens during the procedure?  To lower your risk of infection: Your health care team will wash or sanitize their hands. Your skin will be cleaned with an antiseptic. You may be given a medicine to numb the area (local anesthetic). A tourniquet will be placed on your arm. A needle will be inserted into one of your veins. Tubing and a collection bag will be attached to that needle. Blood will flow through the needle and tubing into the collection bag. The collection bag will be placed lower than your arm to allow gravity to help the flow of blood into the bag. You may be asked to open and close your hand slowly and continually during the entire collection. After the specified amount of blood has been removed from your body, the collection bag and tubing will be clamped. The needle will be removed from your vein. Pressure will be held on the site of the needle insertion to stop the bleeding. A bandage (dressing) will be placed over the needle insertion site. The procedure may vary among health care providers and hospitals. What happens after the procedure? Your blood pressure, pulse rate, and breathing rate will be measured after the procedure. You will be encouraged to drink fluids. Your recovery will be assessed and monitored. You can return to your normal activities as told by your health care provider. Summary Therapeutic phlebotomy is the planned removal of   blood from a person's body for the purpose of treating a medical condition. Therapeutic phlebotomy may be used to treat hemochromatosis, polycythemia vera, porphyria cutanea tarda,  or sickle cell disease. In the procedure, a needle is inserted and about a pint (470 mL, or 0.47 L) of blood is removed. The average adult has 9-12 pints (4.3-5.7 L) of blood in the body. This is generally a safe procedure, but it can sometimes cause problems such as nausea, light-headedness, or low blood pressure (hypotension). This information is not intended to replace advice given to you by your health care provider. Make sure you discuss any questions you have with your healthcare provider. Document Revised: 06/06/2017 Document Reviewed: 06/06/2017 Elsevier Patient Education  2022 [employer]  

## 2021-01-24 ENCOUNTER — Other Ambulatory Visit: Payer: Self-pay | Admitting: Hematology & Oncology

## 2021-01-24 DIAGNOSIS — D45 Polycythemia vera: Secondary | ICD-10-CM

## 2021-01-24 NOTE — Telephone Encounter (Signed)
Please advise for refills, thank you.

## 2021-01-25 ENCOUNTER — Encounter: Payer: Self-pay | Admitting: Hematology & Oncology

## 2021-01-25 MED ORDER — MORPHINE SULFATE ER 60 MG PO TBCR: EXTENDED_RELEASE_TABLET | ORAL | 0 refills | Status: DC

## 2021-01-25 MED ORDER — MORPHINE SULFATE 30 MG PO TABS: ORAL_TABLET | ORAL | 0 refills | Status: DC

## 2021-02-22 ENCOUNTER — Other Ambulatory Visit: Payer: Self-pay | Admitting: Hematology & Oncology

## 2021-02-22 DIAGNOSIS — D45 Polycythemia vera: Secondary | ICD-10-CM

## 2021-02-22 MED ORDER — MORPHINE SULFATE ER 60 MG PO TBCR: EXTENDED_RELEASE_TABLET | ORAL | 0 refills | Status: DC

## 2021-02-22 MED ORDER — MORPHINE SULFATE 30 MG PO TABS
ORAL_TABLET | ORAL | 0 refills | Status: DC
Start: 2021-02-22 — End: 2021-03-24

## 2021-02-22 NOTE — Telephone Encounter (Signed)
Please advise if ok to refill Morphine 30 mg and 60 mg. Last refilled both meds on 01/25/21. The 30 mg was sent in for #180 with no Rfs and the 30 mg was sent in for #60 with no Rfs. Thank you

## 2021-03-03 ENCOUNTER — Other Ambulatory Visit: Payer: Self-pay

## 2021-03-03 ENCOUNTER — Inpatient Hospital Stay (HOSPITAL_BASED_OUTPATIENT_CLINIC_OR_DEPARTMENT_OTHER): Payer: Medicare HMO | Admitting: Hematology & Oncology

## 2021-03-03 ENCOUNTER — Encounter: Payer: Self-pay | Admitting: Hematology & Oncology

## 2021-03-03 ENCOUNTER — Inpatient Hospital Stay: Payer: Medicare HMO | Attending: Hematology & Oncology

## 2021-03-03 VITALS — BP 148/74 | HR 71 | Temp 98.3°F | Resp 18 | Wt 283.0 lb

## 2021-03-03 DIAGNOSIS — F17290 Nicotine dependence, other tobacco product, uncomplicated: Secondary | ICD-10-CM | POA: Insufficient documentation

## 2021-03-03 DIAGNOSIS — M5136 Other intervertebral disc degeneration, lumbar region: Secondary | ICD-10-CM | POA: Insufficient documentation

## 2021-03-03 DIAGNOSIS — D45 Polycythemia vera: Secondary | ICD-10-CM | POA: Diagnosis not present

## 2021-03-03 DIAGNOSIS — Z79899 Other long term (current) drug therapy: Secondary | ICD-10-CM | POA: Diagnosis not present

## 2021-03-03 DIAGNOSIS — Z7982 Long term (current) use of aspirin: Secondary | ICD-10-CM | POA: Diagnosis not present

## 2021-03-03 LAB — CMP (CANCER CENTER ONLY)
ALT: 31 U/L (ref 0–44)
AST: 20 U/L (ref 15–41)
Albumin: 3.9 g/dL (ref 3.5–5.0)
Alkaline Phosphatase: 95 U/L (ref 38–126)
Anion gap: 7 (ref 5–15)
BUN: 10 mg/dL (ref 6–20)
CO2: 33 mmol/L — ABNORMAL HIGH (ref 22–32)
Calcium: 9 mg/dL (ref 8.9–10.3)
Chloride: 100 mmol/L (ref 98–111)
Creatinine: 1.05 mg/dL (ref 0.61–1.24)
GFR, Estimated: >60 mL/min (ref >60)
Glucose, Bld: 131 mg/dL — ABNORMAL HIGH (ref 70–99)
Potassium: 4.3 mmol/L (ref 3.5–5.1)
Sodium: 140 mmol/L (ref 135–145)
Total Bilirubin: 0.4 mg/dL (ref 0.3–1.2)
Total Protein: 6.7 g/dL (ref 6.5–8.1)

## 2021-03-03 LAB — CBC WITH DIFFERENTIAL (CANCER CENTER ONLY)
Abs Immature Granulocytes: 0.03 10*3/uL (ref 0.00–0.07)
Basophils Absolute: 0 10*3/uL (ref 0.0–0.1)
Basophils Relative: 1 %
Eosinophils Absolute: 0.4 10*3/uL (ref 0.0–0.5)
Eosinophils Relative: 6 %
HCT: 44 % (ref 39.0–52.0)
Hemoglobin: 14.2 g/dL (ref 13.0–17.0)
Immature Granulocytes: 1 %
Lymphocytes Relative: 27 %
Lymphs Abs: 1.6 10*3/uL (ref 0.7–4.0)
MCH: 27.7 pg (ref 26.0–34.0)
MCHC: 32.3 g/dL (ref 30.0–36.0)
MCV: 85.9 fL (ref 80.0–100.0)
Monocytes Absolute: 0.6 10*3/uL (ref 0.1–1.0)
Monocytes Relative: 9 %
Neutro Abs: 3.3 10*3/uL (ref 1.7–7.7)
Neutrophils Relative %: 56 %
Platelet Count: 183 10*3/uL (ref 150–400)
RBC: 5.12 MIL/uL (ref 4.22–5.81)
RDW: 13.8 % (ref 11.5–15.5)
WBC Count: 5.8 10*3/uL (ref 4.0–10.5)
nRBC: 0 % (ref 0.0–0.2)

## 2021-03-03 LAB — IRON AND TIBC
Iron: 64 ug/dL (ref 42–163)
Saturation Ratios: 14 % — ABNORMAL LOW (ref 20–55)
TIBC: 448 ug/dL — ABNORMAL HIGH (ref 202–409)
UIBC: 383 ug/dL — ABNORMAL HIGH (ref 117–376)

## 2021-03-03 LAB — FERRITIN: Ferritin: 17 ng/mL — ABNORMAL LOW (ref 24–336)

## 2021-03-03 NOTE — Progress Notes (Signed)
Hematology and Oncology Follow Up Visit  Jacob Bradford [id number] 1960-09-26 60 y.o. 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  We last saw him back in July.  Since then, he has been doing pretty well.  He really has had no specific complaints.  Unfortunately, his weight has been going up quite a bit.  Hopefully he will lose some weight at some point.  He is try to stay active.  He bought a 2004 Chevy.  He is trying to rebuild this.  His son and his girlfriend and moved in.  This is been a little bit stressful for him.  He has had no problems with nausea or vomiting.  He has had no issues with COVID.  He is still smoking but trying to cut back on how much he smokes.  He has had no change in bowel or bladder habits.  His pain control is doing quite nicely.  He is able to function quite well because of his pain medication.  Overall, his performance status is ECOG 1.    Medications:  Allergies as of 03/03/2021   No Known Allergies      Medication List        Accurate as of March 03, 2021 10:37 AM. If you have any questions, ask your nurse or doctor.          acetaminophen 500 MG tablet Commonly known as: TYLENOL Take 1,000 mg by mouth every 6 (six) hours as needed for moderate pain.   ALPRAZolam 1 MG tablet Commonly known as: XANAX Take 1 tablet (1 mg total) by mouth 3 (three) times daily as needed for anxiety. What changed: how much to take   aspirin 81 MG tablet Take 1 tablet (81 mg total) by mouth daily.   calcium carbonate 500 MG chewable tablet Commonly known as: TUMS - dosed in mg elemental calcium Chew 1  tablet by mouth daily.   esomeprazole 40 MG capsule Commonly known as: NEXIUM Take 40 mg by mouth daily at 12 noon.   Goodys Extra Strength [id number] MG Pack Generic drug: Aspirin-Acetaminophen-Caffeine Take by mouth.   lisinopril 40 MG tablet Commonly known as: ZESTRIL Take 40 mg by mouth daily.   losartan-hydrochlorothiazide 100-12.5 MG tablet Commonly known as: HYZAAR Take 1 tablet by mouth daily.   morphine 60 MG 12 hr tablet Commonly known as: MS CONTIN Take twice a day   morphine 30 MG tablet Commonly known as: MSIR Take 1 pill is needed every 4-6 hours for pain   omeprazole 20 MG capsule Commonly known as: PRILOSEC Take 20 mg by mouth.   polyethylene glycol 17 g packet Commonly known as: MIRALAX / GLYCOLAX Take 17 g by mouth daily as needed for moderate constipation.        Allergies:  No Known Allergies  Past Medical History, Surgical history, Social history, and Family History were reviewed and updated.  Review of Systems: Review of Systems  Constitutional: Negative.   HENT: Negative.    Eyes: Negative.   Respiratory: Negative.    Cardiovascular: Negative.   Gastrointestinal: Negative.   Genitourinary: Negative.   Musculoskeletal:  Positive for back  pain and neck pain.  Skin: Negative.   Neurological: Negative.   Endo/Heme/Allergies: Negative.   Psychiatric/Behavioral: Negative.      Physical Exam:  weight is 283 lb (128.4 kg). His oral temperature is 98.3 F (36.8 C). His blood pressure is 148/74 (abnormal) and his pulse is 71. His respiration is 18 and oxygen saturation is 95%.   Wt Readings from Last 3 Encounters:  03/03/21 283 lb (128.4 kg)  12/28/20 271 lb (122.9 kg)  09/26/20 297 lb (134.7 kg)    Physical Exam Vitals reviewed.  HENT:     Head: Normocephalic and atraumatic.  Eyes:     Pupils: Pupils are equal, round, and reactive to light.  Cardiovascular:     Rate and Rhythm: Normal rate and regular rhythm.     Heart sounds:  Normal heart sounds.  Pulmonary:     Effort: Pulmonary effort is normal.     Breath sounds: Normal breath sounds.  Abdominal:     General: Bowel sounds are normal.     Palpations: Abdomen is soft.  Musculoskeletal:        General: No tenderness or deformity. Normal range of motion.     Cervical back: Normal range of motion.  Lymphadenopathy:     Cervical: No cervical adenopathy.  Skin:    General: Skin is warm and dry.     Findings: No erythema or rash.  Neurological:     Mental Status: He is alert and oriented to person, place, and time.  Psychiatric:        Behavior: Behavior normal.        Thought Content: Thought content normal.        Judgment: Judgment normal.     Lab Results  Component Value Date   WBC 5.8 03/03/2021   HGB 14.2 03/03/2021   HCT 44.0 03/03/2021   MCV 85.9 03/03/2021   PLT 183 03/03/2021   Lab Results  Component Value Date   FERRITIN 22 (L) 12/28/2020   IRON 66 12/28/2020   TIBC 485 (H) 12/28/2020   UIBC 419 (H) 12/28/2020   IRONPCTSAT 14 (L) 12/28/2020   Lab Results  Component Value Date   RETICCTPCT 1.4 07/28/2020   RBC 5.12 03/03/2021   RETICCTABS 77.0 02/22/2011   No results found for: KPAFRELGTCHN, LAMBDASER, KAPLAMBRATIO No results found for: IGGSERUM, IGA, IGMSERUM No results found for: Odetta Pink, SPEI   Chemistry      Component Value Date/Time   NA 140 03/03/2021 0958   NA 141 05/09/2017 0910   K 4.3 03/03/2021 0958   K 4.2 05/09/2017 0910   CL 100 03/03/2021 0958   CL 105 11/13/2016 1123   CO2 33 (H) 03/03/2021 0958   CO2 24 05/09/2017 0910   BUN 10 03/03/2021 0958   BUN 15.2 05/09/2017 0910   CREATININE 1.05 03/03/2021 0958   CREATININE 1.2 05/09/2017 0910      Component Value Date/Time   CALCIUM 9.0 03/03/2021 0958   CALCIUM 9.1 05/09/2017 0910   ALKPHOS 95 03/03/2021 0958   ALKPHOS 96 05/09/2017 0910   AST 20 03/03/2021 0958   AST 22 05/09/2017 0910   ALT  31 03/03/2021 0958   ALT 30 05/09/2017 0910   BILITOT 0.4 03/03/2021 0958   BILITOT 0.48 05/09/2017 0910      Impression and Plan: Jacob Bradford is a very pleasant 60 yo caucasian gentleman with secondary polycythemia.  A very impressed with his blood count.  He  does not need to be phlebotomized.  I am very happy about this.  I think we now get him through the holiday season.  I will get him back in January 2023.  I told him that his weight has to be below 260 pounds when we see him back.  I think if he does not lose weight, he is going to run into problems with diabetes, heart disease, high blood pressure, etc.   Volanda Napoleon, MD 9/30/202210:37 AM

## 2021-03-24 ENCOUNTER — Other Ambulatory Visit: Payer: Self-pay | Admitting: Hematology & Oncology

## 2021-03-24 DIAGNOSIS — D45 Polycythemia vera: Secondary | ICD-10-CM

## 2021-03-24 MED ORDER — MORPHINE SULFATE 30 MG PO TABS: ORAL_TABLET | ORAL | 0 refills | Status: DC

## 2021-03-24 MED ORDER — MORPHINE SULFATE ER 60 MG PO TBCR: EXTENDED_RELEASE_TABLET | ORAL | 0 refills | Status: DC

## 2021-04-03 ENCOUNTER — Telehealth: Payer: Self-pay | Admitting: Hematology & Oncology

## 2021-04-24 ENCOUNTER — Other Ambulatory Visit: Payer: Self-pay | Admitting: Hematology & Oncology

## 2021-04-24 DIAGNOSIS — D45 Polycythemia vera: Secondary | ICD-10-CM

## 2021-04-24 MED ORDER — MORPHINE SULFATE ER 60 MG PO TBCR: EXTENDED_RELEASE_TABLET | ORAL | 0 refills | Status: DC

## 2021-04-24 MED ORDER — MORPHINE SULFATE 30 MG PO TABS: ORAL_TABLET | ORAL | 0 refills | Status: DC

## 2021-05-22 ENCOUNTER — Other Ambulatory Visit: Payer: Self-pay | Admitting: Hematology & Oncology

## 2021-05-22 DIAGNOSIS — D45 Polycythemia vera: Secondary | ICD-10-CM

## 2021-06-23 ENCOUNTER — Other Ambulatory Visit: Payer: Medicare HMO

## 2021-06-23 ENCOUNTER — Ambulatory Visit: Payer: Medicare HMO | Admitting: Hematology & Oncology

## 2021-06-26 ENCOUNTER — Other Ambulatory Visit: Payer: Self-pay

## 2021-06-26 ENCOUNTER — Inpatient Hospital Stay: Payer: Medicare HMO

## 2021-06-26 ENCOUNTER — Inpatient Hospital Stay: Payer: Medicare HMO | Attending: Hematology & Oncology

## 2021-06-26 ENCOUNTER — Encounter (HOSPITAL_COMMUNITY): Payer: Self-pay | Admitting: Emergency Medicine

## 2021-06-26 ENCOUNTER — Inpatient Hospital Stay (HOSPITAL_BASED_OUTPATIENT_CLINIC_OR_DEPARTMENT_OTHER): Payer: Medicare HMO | Admitting: Hematology & Oncology

## 2021-06-26 ENCOUNTER — Encounter: Payer: Self-pay | Admitting: Hematology & Oncology

## 2021-06-26 VITALS — BP 110/68 | HR 71 | Temp 97.9°F | Resp 18

## 2021-06-26 DIAGNOSIS — D45 Polycythemia vera: Secondary | ICD-10-CM

## 2021-06-26 LAB — CBC WITH DIFFERENTIAL (CANCER CENTER ONLY)
Abs Immature Granulocytes: 0.05 10*3/uL (ref 0.00–0.07)
Basophils Absolute: 0.1 10*3/uL (ref 0.0–0.1)
Basophils Relative: 1 %
Eosinophils Absolute: 0.3 10*3/uL (ref 0.0–0.5)
Eosinophils Relative: 4 %
HCT: 46.8 % (ref 39.0–52.0)
Hemoglobin: 14.7 g/dL (ref 13.0–17.0)
Immature Granulocytes: 1 %
Lymphocytes Relative: 24 %
Lymphs Abs: 2 10*3/uL (ref 0.7–4.0)
MCH: 27.4 pg (ref 26.0–34.0)
MCHC: 31.4 g/dL (ref 30.0–36.0)
MCV: 87.3 fL (ref 80.0–100.0)
Monocytes Absolute: 0.8 10*3/uL (ref 0.1–1.0)
Monocytes Relative: 10 %
Neutro Abs: 5 10*3/uL (ref 1.7–7.7)
Neutrophils Relative %: 60 %
Platelet Count: 229 10*3/uL (ref 150–400)
RBC: 5.36 MIL/uL (ref 4.22–5.81)
RDW: 13.7 % (ref 11.5–15.5)
WBC Count: 8.3 10*3/uL (ref 4.0–10.5)
nRBC: 0 % (ref 0.0–0.2)

## 2021-06-26 LAB — CMP (CANCER CENTER ONLY)
ALT: 19 U/L (ref 0–44)
AST: 13 U/L — ABNORMAL LOW (ref 15–41)
Albumin: 3.9 g/dL (ref 3.5–5.0)
Alkaline Phosphatase: 117 U/L (ref 38–126)
Anion gap: 4 — ABNORMAL LOW (ref 5–15)
BUN: 14 mg/dL (ref 6–20)
CO2: 36 mmol/L — ABNORMAL HIGH (ref 22–32)
Calcium: 9.2 mg/dL (ref 8.9–10.3)
Chloride: 98 mmol/L (ref 98–111)
Creatinine: 1.13 mg/dL (ref 0.61–1.24)
GFR, Estimated: >60 mL/min (ref >60)
Glucose, Bld: 122 mg/dL — ABNORMAL HIGH (ref 70–99)
Potassium: 4.6 mmol/L (ref 3.5–5.1)
Sodium: 138 mmol/L (ref 135–145)
Total Bilirubin: 0.3 mg/dL (ref 0.3–1.2)
Total Protein: 6.8 g/dL (ref 6.5–8.1)

## 2021-06-26 LAB — FERRITIN: Ferritin: 16 ng/mL — ABNORMAL LOW (ref 24–336)

## 2021-06-26 LAB — IRON AND IRON BINDING CAPACITY (CC-WL,HP ONLY)
Iron: 52 ug/dL (ref 45–182)
Saturation Ratios: 10 % — ABNORMAL LOW (ref 17.9–39.5)
TIBC: 508 ug/dL — ABNORMAL HIGH (ref 250–450)
UIBC: 456 ug/dL — ABNORMAL HIGH (ref 117–376)

## 2021-06-26 MED ORDER — MORPHINE SULFATE ER 60 MG PO TBCR: EXTENDED_RELEASE_TABLET | ORAL | 0 refills | Status: DC

## 2021-06-26 MED ORDER — MORPHINE SULFATE 30 MG PO TABS: ORAL_TABLET | ORAL | 0 refills | Status: DC

## 2021-06-26 NOTE — Progress Notes (Signed)
Hematology and Oncology Follow Up Visit  Jacob Bradford [id number] 03/02/1961 61 y.o. 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  He made it through the holidays.  He is doing pretty well right now.  He really has no specific complaints.  He will be phlebotomized today.  He is still working on a 2004 [employer]vy.  Thankfully, his son has a nice job now.  Hopefully this will help some of the stress.  He has had no problems with cough or shortness of breath.  He is still smoking.  He has had no change in bowel or bladder habits.  He has noticed some skin lesions.  They look like keratoses.  As such, he will see his dermatologist.  He has had no problems with leg swelling.  Overall, his performance status is ECOG 1.    Medications:  Allergies as of 06/26/2021   No Known Allergies      Medication List        Accurate as of June 26, 2021 11:50 AM. If you have any questions, ask your nurse or doctor.          acetaminophen 500 MG tablet Commonly known as: TYLENOL Take 1,000 mg by mouth every 6 (six) hours as needed for moderate pain.   ALPRAZolam 1 MG tablet Commonly known as: XANAX Take 1 tablet (1 mg total) by mouth 3 (three) times daily as needed for anxiety. What changed: how much to take   aspirin 81 MG tablet Take 1 tablet (81 mg total) by mouth daily.   calcium carbonate 500 MG chewable tablet Commonly known as: TUMS - dosed in mg elemental calcium Chew 1 tablet by mouth daily.   esomeprazole 40 MG capsule Commonly known as: NEXIUM Take 40 mg by mouth daily at 12 noon.   [employer] Extra Strength [id number] MG  Pack Generic drug: Aspirin-Acetaminophen-Caffeine Take by mouth.   lisinopril 40 MG tablet Commonly known as: ZESTRIL Take 40 mg by mouth daily.   losartan-hydrochlorothiazide 100-12.5 MG tablet Commonly known as: HYZAAR Take 1 tablet by mouth daily.   morphine 60 MG 12 hr tablet Commonly known as: MS CONTIN TAKE 1 TABLET 2 TIMES A DAY   morphine 30 MG tablet Commonly known as: MSIR TAKE 1 TABLET EVERY 4 TO 6 HOURS AS NEEDED FOR PAIN   omeprazole 20 MG capsule Commonly known as: PRILOSEC Take 20 mg by mouth.   polyethylene glycol 17 g packet Commonly known as: MIRALAX / GLYCOLAX Take 17 g by mouth daily as needed for moderate constipation.        Allergies:  No Known Allergies  Past Medical History, Surgical history, Social history, and Family History were reviewed and updated.  Review of Systems: Review of Systems  Constitutional: Negative.   HENT: Negative.    Eyes: Negative.   Respiratory: Negative.    Cardiovascular: Negative.   Gastrointestinal: Negative.   Genitourinary: Negative.   Musculoskeletal:  Positive for back pain and neck pain.  Skin: Negative.   Neurological: Negative.   Endo/Heme/Allergies: Negative.   Psychiatric/Behavioral: Negative.      Physical Exam:  weight is 287 lb (130.2 kg).  His blood pressure is 111/75 and his pulse is 77.   Wt Readings from Last 3 Encounters:  06/26/21 287 lb (130.2 kg)  03/03/21 283 lb (128.4 kg)  12/28/20 271 lb (122.9 kg)    Physical Exam Vitals reviewed.  HENT:     Head: Normocephalic and atraumatic.  Eyes:     Pupils: Pupils are equal, round, and reactive to light.  Cardiovascular:     Rate and Rhythm: Normal rate and regular rhythm.     Heart sounds: Normal heart sounds.  Pulmonary:     Effort: Pulmonary effort is normal.     Breath sounds: Normal breath sounds.  Abdominal:     General: Bowel sounds are normal.     Palpations: Abdomen is soft.  Musculoskeletal:        General: No  tenderness or deformity. Normal range of motion.     Cervical back: Normal range of motion.  Lymphadenopathy:     Cervical: No cervical adenopathy.  Skin:    General: Skin is warm and dry.     Findings: No erythema or rash.  Neurological:     Mental Status: He is alert and oriented to person, place, and time.  Psychiatric:        Behavior: Behavior normal.        Thought Content: Thought content normal.        Judgment: Judgment normal.     Lab Results  Component Value Date   WBC 8.3 06/26/2021   HGB 14.7 06/26/2021   HCT 46.8 06/26/2021   MCV 87.3 06/26/2021   PLT 229 06/26/2021   Lab Results  Component Value Date   FERRITIN 17 (L) 03/03/2021   IRON 64 03/03/2021   TIBC 448 (H) 03/03/2021   UIBC 383 (H) 03/03/2021   IRONPCTSAT 14 (L) 03/03/2021   Lab Results  Component Value Date   RETICCTPCT 1.4 07/28/2020   RBC 5.36 06/26/2021   RETICCTABS 77.0 02/22/2011   No results found for: KPAFRELGTCHN, LAMBDASER, KAPLAMBRATIO No results found for: IGGSERUM, IGA, IGMSERUM No results found for: Odetta Pink, SPEI   Chemistry      Component Value Date/Time   NA 138 06/26/2021 0933   NA 141 05/09/2017 0910   K 4.6 06/26/2021 0933   K 4.2 05/09/2017 0910   CL 98 06/26/2021 0933   CL 105 11/13/2016 1123   CO2 36 (H) 06/26/2021 0933   CO2 24 05/09/2017 0910   BUN 14 06/26/2021 0933   BUN 15.2 05/09/2017 0910   CREATININE 1.13 06/26/2021 0933   CREATININE 1.2 05/09/2017 0910      Component Value Date/Time   CALCIUM 9.2 06/26/2021 0933   CALCIUM 9.1 05/09/2017 0910   ALKPHOS 117 06/26/2021 0933   ALKPHOS 96 05/09/2017 0910   AST 13 (L) 06/26/2021 0933   AST 22 05/09/2017 0910   ALT 19 06/26/2021 0933   ALT 30 05/09/2017 0910   BILITOT 0.3 06/26/2021 0933   BILITOT 0.48 05/09/2017 0910      Impression and Plan: Jacob Bradford is a very pleasant 61 yo caucasian gentleman with secondary polycythemia.  Again, we  will phlebotomize him.  I think if we do this, we would probably not have to get him back until the spring.  I am just happy that everything is going well with him and that stress wise, he is improving.  Hopefully he will continue to lose weight.  It be nice if he got his  weight down below 250 pounds this year.   Volanda Napoleon, MD 1/23/202311:50 AM

## 2021-06-26 NOTE — Progress Notes (Signed)
Jacob Bradford presents today for phlebotomy per MD orders. Phlebotomy procedure started at 1105 and ended at 1115 via phlebotomy kit to right ac. 550 grams removed per Hilario Quarry RN.  Patient observed for 30 minutes after procedure without any incident. Patient tolerated procedure well. IV needle removed intact.

## 2021-06-26 NOTE — Progress Notes (Signed)
PA started with [employer] over the phone for MS Contin 60 mg.  Reference number [id number].  Approved until 06/03/2022.

## 2021-07-24 ENCOUNTER — Other Ambulatory Visit: Payer: Self-pay | Admitting: Hematology & Oncology

## 2021-07-24 DIAGNOSIS — D45 Polycythemia vera: Secondary | ICD-10-CM

## 2021-07-24 MED ORDER — MORPHINE SULFATE ER 60 MG PO TBCR: EXTENDED_RELEASE_TABLET | ORAL | 0 refills | Status: DC

## 2021-07-24 MED ORDER — MORPHINE SULFATE 30 MG PO TABS: ORAL_TABLET | ORAL | 0 refills | Status: DC

## 2021-08-23 ENCOUNTER — Other Ambulatory Visit: Payer: Self-pay | Admitting: Hematology & Oncology

## 2021-08-23 DIAGNOSIS — D45 Polycythemia vera: Secondary | ICD-10-CM

## 2021-08-23 MED ORDER — MORPHINE SULFATE ER 60 MG PO TBCR: EXTENDED_RELEASE_TABLET | ORAL | 0 refills | Status: DC

## 2021-08-23 MED ORDER — MORPHINE SULFATE 30 MG PO TABS: ORAL_TABLET | ORAL | 0 refills | Status: DC

## 2021-09-22 ENCOUNTER — Other Ambulatory Visit: Payer: Self-pay | Admitting: Hematology & Oncology

## 2021-09-22 DIAGNOSIS — D45 Polycythemia vera: Secondary | ICD-10-CM

## 2021-09-22 MED ORDER — MORPHINE SULFATE ER 60 MG PO TBCR: EXTENDED_RELEASE_TABLET | ORAL | 0 refills | Status: DC

## 2021-09-22 MED ORDER — MORPHINE SULFATE 30 MG PO TABS: ORAL_TABLET | ORAL | 0 refills | Status: DC

## 2021-10-02 ENCOUNTER — Other Ambulatory Visit: Payer: Self-pay

## 2021-10-02 ENCOUNTER — Inpatient Hospital Stay: Payer: Medicare HMO | Admitting: Hematology & Oncology

## 2021-10-02 ENCOUNTER — Encounter: Payer: Self-pay | Admitting: Hematology & Oncology

## 2021-10-02 ENCOUNTER — Inpatient Hospital Stay: Payer: Medicare HMO

## 2021-10-02 ENCOUNTER — Inpatient Hospital Stay: Payer: Medicare HMO | Attending: Hematology & Oncology

## 2021-10-02 VITALS — BP 151/64 | HR 74 | Temp 98.0°F | Resp 18 | Ht 72.05 in | Wt 301.8 lb

## 2021-10-02 DIAGNOSIS — D45 Polycythemia vera: Secondary | ICD-10-CM | POA: Diagnosis present

## 2021-10-02 LAB — CBC WITH DIFFERENTIAL (CANCER CENTER ONLY)
Abs Immature Granulocytes: 0.01 10*3/uL (ref 0.00–0.07)
Basophils Absolute: 0 10*3/uL (ref 0.0–0.1)
Basophils Relative: 1 %
Eosinophils Absolute: 0.2 10*3/uL (ref 0.0–0.5)
Eosinophils Relative: 4 %
HCT: 42.7 % (ref 39.0–52.0)
Hemoglobin: 13.2 g/dL (ref 13.0–17.0)
Immature Granulocytes: 0 %
Lymphocytes Relative: 29 %
Lymphs Abs: 1.5 10*3/uL (ref 0.7–4.0)
MCH: 26.5 pg (ref 26.0–34.0)
MCHC: 30.9 g/dL (ref 30.0–36.0)
MCV: 85.6 fL (ref 80.0–100.0)
Monocytes Absolute: 0.4 10*3/uL (ref 0.1–1.0)
Monocytes Relative: 8 %
Neutro Abs: 3 10*3/uL (ref 1.7–7.7)
Neutrophils Relative %: 58 %
Platelet Count: 174 10*3/uL (ref 150–400)
RBC: 4.99 MIL/uL (ref 4.22–5.81)
RDW: 14.8 % (ref 11.5–15.5)
WBC Count: 5.1 10*3/uL (ref 4.0–10.5)
nRBC: 0 % (ref 0.0–0.2)

## 2021-10-02 LAB — CMP (CANCER CENTER ONLY)
ALT: 24 U/L (ref 0–44)
AST: 18 U/L (ref 15–41)
Albumin: 3.7 g/dL (ref 3.5–5.0)
Alkaline Phosphatase: 86 U/L (ref 38–126)
Anion gap: 8 (ref 5–15)
BUN: 17 mg/dL (ref 6–20)
CO2: 33 mmol/L — ABNORMAL HIGH (ref 22–32)
Calcium: 9 mg/dL (ref 8.9–10.3)
Chloride: 99 mmol/L (ref 98–111)
Creatinine: 1.05 mg/dL (ref 0.61–1.24)
GFR, Estimated: >60 mL/min (ref >60)
Glucose, Bld: 154 mg/dL — ABNORMAL HIGH (ref 70–99)
Potassium: 4 mmol/L (ref 3.5–5.1)
Sodium: 140 mmol/L (ref 135–145)
Total Bilirubin: 0.4 mg/dL (ref 0.3–1.2)
Total Protein: 6.6 g/dL (ref 6.5–8.1)

## 2021-10-02 LAB — IRON AND IRON BINDING CAPACITY (CC-WL,HP ONLY)
Iron: 90 ug/dL (ref 45–182)
Saturation Ratios: 18 % (ref 17.9–39.5)
TIBC: 514 ug/dL — ABNORMAL HIGH (ref 250–450)
UIBC: 424 ug/dL — ABNORMAL HIGH (ref 117–376)

## 2021-10-02 LAB — FERRITIN: Ferritin: 13 ng/mL — ABNORMAL LOW (ref 24–336)

## 2021-10-02 NOTE — Progress Notes (Signed)
?Hematology and Oncology Follow Up Visit ? ?Jacob Bradford ?[id number] ?17-Feb-1961 61 y.o. ?10/02/2021 ? ? 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 ?Current Therapy:   ?Phlebotomy to maintain hematocrit below 45% ?Aspirin 81 mg by mouth daily ?  ?Interim History:  Jacob Bradford is here today for follow-up.  Unfortunately, he is gaining weight.  His weight now is over 300 pounds.  I really troubled by this.  I just wish to be able to lose weight.  He just is not exercising.  He is not eating as much.  But again, he just is not burning calories. ? ?He has had no problems with fever.  He has had a decrease in his tobacco use.  He says he may have 1 pack of cigarettes a week. ? ?He has had no issues with headache. ? ?He still has a chronic pain issues. ? ?His last iron studies done back in January showed a ferritin of 16 with an iron saturation of 10%. ? ?He has had no change in bowel or bladder habits.  There has been no rashes.  Is a little bit of leg swelling, which I think is probably from the weight gain. ? ?Overall, I would have to say that his performance status is ECOG 1..   ? ?Medications:  ?Allergies as of 10/02/2021   ?No Known Allergies ?  ? ?  ?Medication List  ?  ? ?  ? Accurate as of Oct 02, 2021 12:42 PM. If you have any questions, ask your nurse or doctor.  ?  ?  ? ?  ? ?acetaminophen 500 MG tablet ?Commonly known as: TYLENOL ?Take 1,000 mg by mouth every 6 (six) hours as needed for moderate pain. ?  ?ALPRAZolam 1 MG tablet ?Commonly known as: Duanne Moron ?Take 1 tablet (1 mg total) by mouth 3 (three) times daily as needed for anxiety. ?What changed: how much to take ?  ?aspirin 81 MG tablet ?Take 1 tablet (81 mg total) by mouth daily. ?  ?calcium carbonate 500 MG chewable tablet ?Commonly known as: TUMS - dosed in mg elemental  calcium ?Chew 1 tablet by mouth daily. ?  ?esomeprazole 40 MG capsule ?Commonly known as: Peetz ?Take 40 mg by mouth daily at 12 noon. ?  ?[employer] Extra Strength [id number] MG Pack ?Generic drug: Aspirin-Acetaminophen-Caffeine ?Take by mouth. ?  ?lisinopril 40 MG tablet ?Commonly known as: ZESTRIL ?Take 40 mg by mouth daily. ?  ?losartan-hydrochlorothiazide 100-12.5 MG tablet ?Commonly known as: HYZAAR ?Take 1 tablet by mouth daily. ?  ?morphine 60 MG 12 hr tablet ?Commonly known as: MS CONTIN ?TAKE 1 TABLET 2 TIMES A DAY ?  ?morphine 30 MG tablet ?Commonly known as: MSIR ?TAKE 1 TABLET EVERY 4 TO 6 HOURS AS NEEDED FOR PAIN ?  ?omeprazole 20 MG capsule ?Commonly known as: PRILOSEC ?Take 20 mg by mouth. ?  ?polyethylene glycol 17 g packet ?Commonly known as: MIRALAX / GLYCOLAX ?Take 17 g by mouth daily as needed for moderate constipation. ?  ? ?  ? ? ?Allergies:  ?No Known Allergies ? ?Past Medical History, Surgical history, Social history, and Family History were reviewed and updated. ? ?Review of Systems: ?Review of Systems  ?Constitutional: Negative.   ?HENT: Negative.    ?Eyes: Negative.   ?Respiratory: Negative.    ?Cardiovascular: Negative.   ?  Gastrointestinal: Negative.   ?Genitourinary: Negative.   ?Musculoskeletal:  Positive for back pain and neck pain.  ?Skin: Negative.   ?Neurological: Negative.   ?Endo/Heme/Allergies: Negative.   ?Psychiatric/Behavioral: Negative.    ? ? ?Physical Exam: ? height is 6' 0.05" (1.83 m) and weight is 301 lb 12.8 oz (136.9 kg) (abnormal). His oral temperature is 98 ?F (36.7 ?C). His blood pressure is 151/64 (abnormal) and his pulse is 74. His respiration is 18 and oxygen saturation is 95%.  ? ?Wt Readings from Last 3 Encounters:  ?10/02/21 (!) 301 lb 12.8 oz (136.9 kg)  ?06/26/21 287 lb (130.2 kg)  ?03/03/21 283 lb (128.4 kg)  ? ? ?Physical Exam ?Vitals reviewed.  ?HENT:  ?   Head: Normocephalic and atraumatic.  ?Eyes:  ?   Pupils: Pupils are equal, round, and reactive to  light.  ?Cardiovascular:  ?   Rate and Rhythm: Normal rate and regular rhythm.  ?   Heart sounds: Normal heart sounds.  ?Pulmonary:  ?   Effort: Pulmonary effort is normal.  ?   Breath sounds: Normal breath sounds.  ?Abdominal:  ?   General: Bowel sounds are normal.  ?   Palpations: Abdomen is soft.  ?Musculoskeletal:     ?   General: No tenderness or deformity. Normal range of motion.  ?   Cervical back: Normal range of motion.  ?Lymphadenopathy:  ?   Cervical: No cervical adenopathy.  ?Skin: ?   General: Skin is warm and dry.  ?   Findings: No erythema or rash.  ?Neurological:  ?   Mental Status: He is alert and oriented to person, place, and time.  ?Psychiatric:     ?   Behavior: Behavior normal.     ?   Thought Content: Thought content normal.     ?   Judgment: Judgment normal.  ? ? ? ?Lab Results  ?Component Value Date  ? WBC 5.1 10/02/2021  ? HGB 13.2 10/02/2021  ? HCT 42.7 10/02/2021  ? MCV 85.6 10/02/2021  ? PLT 174 10/02/2021  ? ?Lab Results  ?Component Value Date  ? FERRITIN 16 (L) 06/26/2021  ? IRON 52 06/26/2021  ? TIBC 508 (H) 06/26/2021  ? UIBC 456 (H) 06/26/2021  ? IRONPCTSAT 10 (L) 06/26/2021  ? ?Lab Results  ?Component Value Date  ? RETICCTPCT 1.4 07/28/2020  ? RBC 4.99 10/02/2021  ? RETICCTABS 77.0 02/22/2011  ? ?No results found for: KPAFRELGTCHN, LAMBDASER, KAPLAMBRATIO ?No results found for: IGGSERUM, IGA, IGMSERUM ?No results found for: TOTALPROTELP, ALBUMINELP, A1GS, A2GS, BETS, BETA2SER, GAMS, MSPIKE, SPEI ?  Chemistry   ?   ?Component Value Date/Time  ? NA 140 10/02/2021 1148  ? NA 141 05/09/2017 0910  ? K 4.0 10/02/2021 1148  ? K 4.2 05/09/2017 0910  ? CL 99 10/02/2021 1148  ? CL 105 11/13/2016 1123  ? CO2 33 (H) 10/02/2021 1148  ? CO2 24 05/09/2017 0910  ? BUN 17 10/02/2021 1148  ? BUN 15.2 05/09/2017 0910  ? CREATININE 1.05 10/02/2021 1148  ? CREATININE 1.2 05/09/2017 0910  ?    ?Component Value Date/Time  ? CALCIUM 9.0 10/02/2021 1148  ? CALCIUM 9.1 05/09/2017 0910  ? ALKPHOS 86  10/02/2021 1148  ? ALKPHOS 96 05/09/2017 0910  ? AST 18 10/02/2021 1148  ? AST 22 05/09/2017 0910  ? ALT 24 10/02/2021 1148  ? ALT 30 05/09/2017 0910  ? BILITOT 0.4 10/02/2021 1148  ? BILITOT 0.48 05/09/2017 0910  ?  ? ? ?  Impression and Plan: Jacob Bradford is a very pleasant 61 yo caucasian gentleman with secondary polycythemia. ? ?I am happy that he does not need a phlebotomy.  Hopefully, some of this might be from the fact that he is not smoking that much. ? ?Again the weight gain is going to be a problem.  If he does not lose weight, he will start to run into difficulties. ? ?We will plan to get him back to see [place] in another 3 to 4 months.  We will try to get him through most of the summer if possible. ? ? ? ?Volanda Napoleon, MD ?5/1/202312:42 PM ?

## 2021-10-22 ENCOUNTER — Other Ambulatory Visit: Payer: Self-pay | Admitting: Hematology & Oncology

## 2021-10-22 DIAGNOSIS — D45 Polycythemia vera: Secondary | ICD-10-CM

## 2021-10-23 ENCOUNTER — Encounter: Payer: Self-pay | Admitting: Hematology & Oncology

## 2021-10-23 MED ORDER — MORPHINE SULFATE ER 60 MG PO TBCR: EXTENDED_RELEASE_TABLET | ORAL | 0 refills | Status: DC

## 2021-10-23 MED ORDER — MORPHINE SULFATE 30 MG PO TABS: ORAL_TABLET | ORAL | 0 refills | Status: DC

## 2021-10-23 NOTE — Telephone Encounter (Signed)
Last OV 10/02/21 and Rx last refilled 09/22/20. Please advise, thanks.

## 2021-11-21 ENCOUNTER — Other Ambulatory Visit: Payer: Self-pay | Admitting: Hematology & Oncology

## 2021-11-21 DIAGNOSIS — D45 Polycythemia vera: Secondary | ICD-10-CM

## 2021-11-21 MED ORDER — MORPHINE SULFATE 30 MG PO TABS: ORAL_TABLET | ORAL | 0 refills | Status: DC

## 2021-11-21 MED ORDER — MORPHINE SULFATE ER 60 MG PO TBCR: EXTENDED_RELEASE_TABLET | ORAL | 0 refills | Status: DC

## 2021-12-21 ENCOUNTER — Other Ambulatory Visit: Payer: Self-pay | Admitting: Hematology & Oncology

## 2021-12-21 DIAGNOSIS — D45 Polycythemia vera: Secondary | ICD-10-CM

## 2021-12-22 ENCOUNTER — Other Ambulatory Visit: Payer: Self-pay | Admitting: Hematology & Oncology

## 2021-12-22 DIAGNOSIS — D45 Polycythemia vera: Secondary | ICD-10-CM

## 2021-12-23 ENCOUNTER — Encounter: Payer: Self-pay | Admitting: Hematology & Oncology

## 2021-12-25 ENCOUNTER — Encounter: Payer: Self-pay | Admitting: Hematology & Oncology

## 2021-12-25 NOTE — Telephone Encounter (Signed)
This encounter was created in error - please disregard.

## 2022-01-22 ENCOUNTER — Other Ambulatory Visit: Payer: Self-pay | Admitting: Hematology & Oncology

## 2022-01-22 DIAGNOSIS — D45 Polycythemia vera: Secondary | ICD-10-CM

## 2022-01-23 ENCOUNTER — Encounter: Payer: Self-pay | Admitting: Hematology & Oncology

## 2022-01-23 MED ORDER — MORPHINE SULFATE 30 MG PO TABS: ORAL_TABLET | ORAL | 0 refills | Status: DC

## 2022-01-23 MED ORDER — MORPHINE SULFATE ER 60 MG PO TBCR: EXTENDED_RELEASE_TABLET | ORAL | 0 refills | Status: DC

## 2022-01-23 NOTE — Telephone Encounter (Signed)
Refill request received for Morphine 30 mg #180 and Morphine 60 mg #60. Last refilled 12/23/21. Please advise for refills, thanks.

## 2022-01-25 ENCOUNTER — Inpatient Hospital Stay: Payer: Medicare HMO

## 2022-01-25 ENCOUNTER — Inpatient Hospital Stay: Payer: Medicare HMO | Admitting: Hematology & Oncology

## 2022-01-25 ENCOUNTER — Encounter: Payer: Self-pay | Admitting: Hematology & Oncology

## 2022-01-25 ENCOUNTER — Other Ambulatory Visit: Payer: Self-pay

## 2022-01-25 ENCOUNTER — Inpatient Hospital Stay: Payer: Medicare HMO | Attending: Hematology & Oncology

## 2022-01-25 VITALS — BP 112/61 | HR 63 | Resp 17

## 2022-01-25 VITALS — BP 129/67 | HR 74 | Temp 98.2°F | Resp 18 | Ht 72.0 in | Wt 288.1 lb

## 2022-01-25 DIAGNOSIS — D45 Polycythemia vera: Secondary | ICD-10-CM | POA: Insufficient documentation

## 2022-01-25 LAB — CBC WITH DIFFERENTIAL (CANCER CENTER ONLY)
Abs Immature Granulocytes: 0.05 10*3/uL (ref 0.00–0.07)
Basophils Absolute: 0.1 10*3/uL (ref 0.0–0.1)
Basophils Relative: 1 %
Eosinophils Absolute: 0.4 10*3/uL (ref 0.0–0.5)
Eosinophils Relative: 4 %
HCT: 45.2 % (ref 39.0–52.0)
Hemoglobin: 14.7 g/dL (ref 13.0–17.0)
Immature Granulocytes: 1 %
Lymphocytes Relative: 23 %
Lymphs Abs: 2.4 10*3/uL (ref 0.7–4.0)
MCH: 28.5 pg (ref 26.0–34.0)
MCHC: 32.5 g/dL (ref 30.0–36.0)
MCV: 87.6 fL (ref 80.0–100.0)
Monocytes Absolute: 0.9 10*3/uL (ref 0.1–1.0)
Monocytes Relative: 8 %
Neutro Abs: 6.5 10*3/uL (ref 1.7–7.7)
Neutrophils Relative %: 63 %
Platelet Count: 212 10*3/uL (ref 150–400)
RBC: 5.16 MIL/uL (ref 4.22–5.81)
RDW: 13.4 % (ref 11.5–15.5)
WBC Count: 10.2 10*3/uL (ref 4.0–10.5)
nRBC: 0 % (ref 0.0–0.2)

## 2022-01-25 LAB — CMP (CANCER CENTER ONLY)
ALT: 18 U/L (ref 0–44)
AST: 14 U/L — ABNORMAL LOW (ref 15–41)
Albumin: 4.3 g/dL (ref 3.5–5.0)
Alkaline Phosphatase: 92 U/L (ref 38–126)
Anion gap: 8 (ref 5–15)
BUN: 14 mg/dL (ref 6–20)
CO2: 29 mmol/L (ref 22–32)
Calcium: 9.1 mg/dL (ref 8.9–10.3)
Chloride: 99 mmol/L (ref 98–111)
Creatinine: 1.13 mg/dL (ref 0.61–1.24)
GFR, Estimated: >60 mL/min (ref >60)
Glucose, Bld: 109 mg/dL — ABNORMAL HIGH (ref 70–99)
Potassium: 3.6 mmol/L (ref 3.5–5.1)
Sodium: 136 mmol/L (ref 135–145)
Total Bilirubin: 0.6 mg/dL (ref 0.3–1.2)
Total Protein: 7.4 g/dL (ref 6.5–8.1)

## 2022-01-25 LAB — FERRITIN: Ferritin: 24 ng/mL (ref 24–336)

## 2022-01-25 NOTE — Patient Instructions (Signed)

## 2022-01-25 NOTE — Progress Notes (Signed)
Jacob Bradford presents today for phlebotomy per MD orders. Phlebotomy procedure started at 1455 and ended at 1328 415 grams removed. Patient observed for 30 minutes after procedure without any incident. Patient tolerated procedure well. IV needle removed intact.
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
## 2022-01-26 LAB — IRON AND IRON BINDING CAPACITY (CC-WL,HP ONLY)
Iron: 52 ug/dL (ref 45–182)
Saturation Ratios: 11 % — ABNORMAL LOW (ref 17.9–39.5)
TIBC: 468 ug/dL — ABNORMAL HIGH (ref 250–450)
UIBC: 416 ug/dL — ABNORMAL HIGH (ref 117–376)

## 2022-02-20 ENCOUNTER — Other Ambulatory Visit: Payer: Self-pay | Admitting: Hematology & Oncology

## 2022-02-20 DIAGNOSIS — D45 Polycythemia vera: Secondary | ICD-10-CM

## 2022-02-21 ENCOUNTER — Encounter: Payer: Self-pay | Admitting: Hematology & Oncology

## 2022-02-21 MED ORDER — MORPHINE SULFATE 30 MG PO TABS: ORAL_TABLET | ORAL | 0 refills | Status: DC

## 2022-02-21 MED ORDER — MORPHINE SULFATE ER 60 MG PO TBCR: EXTENDED_RELEASE_TABLET | ORAL | 0 refills | Status: DC

## 2022-02-21 NOTE — Telephone Encounter (Signed)
Last refilled both prescriptions x 1 month on 01/23/22. Please advise, thanks!

## 2022-03-21 ENCOUNTER — Other Ambulatory Visit: Payer: Self-pay | Admitting: Hematology & Oncology

## 2022-03-21 ENCOUNTER — Encounter: Payer: Self-pay | Admitting: Orthopaedic Surgery

## 2022-03-21 ENCOUNTER — Ambulatory Visit (INDEPENDENT_AMBULATORY_CARE_PROVIDER_SITE_OTHER): Payer: Medicare HMO | Admitting: Orthopaedic Surgery

## 2022-03-21 ENCOUNTER — Ambulatory Visit (INDEPENDENT_AMBULATORY_CARE_PROVIDER_SITE_OTHER): Payer: Medicare HMO

## 2022-03-21 VITALS — Ht 71.0 in | Wt 297.0 lb

## 2022-03-21 DIAGNOSIS — M1611 Unilateral primary osteoarthritis, right hip: Secondary | ICD-10-CM | POA: Diagnosis not present

## 2022-03-21 DIAGNOSIS — Z6841 Body Mass Index (BMI) 40.0 and over, adult: Secondary | ICD-10-CM | POA: Diagnosis not present

## 2022-03-21 DIAGNOSIS — M25551 Pain in right hip: Secondary | ICD-10-CM

## 2022-03-21 DIAGNOSIS — D45 Polycythemia vera: Secondary | ICD-10-CM

## 2022-03-21 MED ORDER — MORPHINE SULFATE 30 MG PO TABS: ORAL_TABLET | ORAL | 0 refills | Status: DC

## 2022-03-21 MED ORDER — MORPHINE SULFATE ER 60 MG PO TBCR: EXTENDED_RELEASE_TABLET | ORAL | 0 refills | Status: DC

## 2022-03-21 NOTE — Progress Notes (Signed)
Office Visit Note   Patient: Jacob Bradford           Date of Birth: May 30, 1961           MRN: [id number] Visit Date: 03/21/2022              Requested by: Everardo Beals, NP [place],  [place] [address] PCP: Everardo Beals, NP   Assessment & Plan: Visit Diagnoses:  1. Primary osteoarthritis of right hip   2. Body mass index 40.0-44.9, adult (HCC)     Plan: Impression is end-stage right hip DJD.  He is bone-on-bone unfortunately.  However his BMI poses a risk for complications as well as the possible fungal infection in his skin folds.  He will see his PCP to get this treated and he will make all efforts at weight loss before we can proceed with surgery.  We have made a referral to [facility] a weight loss clinic.  Follow-up as needed.  Follow-Up Instructions: No follow-ups on file.   Orders:  Orders Placed This Encounter  Procedures   XR HIP UNILAT W OR W/O PELVIS 2-3 VIEWS RIGHT   Prealbumin   Amb Ref to Medical Weight Management   No orders of the defined types were placed in this encounter.     Procedures: No procedures performed   Clinical Data: No additional findings.   Subjective: Chief Complaint  Patient presents with   Right Hip - Pain    HPI  Jacob Bradford is a very pleasant 61 year old gentleman here for chronic right hip pain for a year.  He has noticed significant pain and difficulty with ADLs such as standing and walking.  He is unable to exercise.  He can barely stand for any period of time.  He is on MS Contin for chronic back pain.  He is status post back surgery 2017 by Dr. Ronnald Ramp.  Currently walks with a cane.  Takes baby aspirin for polycythemia vera.  He lives with his son.  On disability.  Review of Systems  Constitutional: Negative.   All other systems reviewed and are negative.    Objective: Vital Signs: Ht '5\' 11"'$  (1.803 m)   Wt 297 lb (134.7 kg)   BMI 41.42 kg/m   Physical Exam Vitals and nursing note reviewed.   Constitutional:      Appearance: He is well-developed.  HENT:     Head: Normocephalic and atraumatic.  Eyes:     Pupils: Pupils are equal, round, and reactive to light.  Pulmonary:     Effort: Pulmonary effort is normal.  Abdominal:     Palpations: Abdomen is soft.  Musculoskeletal:        General: Normal range of motion.     Cervical back: Neck supple.  Skin:    General: Skin is warm.  Neurological:     Mental Status: He is alert and oriented to person, place, and time.  Psychiatric:        Behavior: Behavior normal.        Thought Content: Thought content normal.        Judgment: Judgment normal.     Ortho Exam Examination of right hip shows antalgic gait and pain with flexion past 40 degrees.  Pain with internal rotation and logroll.  He does not have any trochanteric tenderness.  He does have redness in the groin crease and folds concerning for fungal infection. Specialty Comments:  No specialty comments available.  Imaging: XR HIP UNILAT W OR  W/O PELVIS 2-3 VIEWS RIGHT  Result Date: 03/21/2022 Bone-on-bone right hip DJD.    PMFS History: Patient Active Problem List   Diagnosis Date Noted   Primary osteoarthritis of right hip 03/21/2022   Body mass index 40.0-44.9, adult ([place]) 03/21/2022   IDA (iron deficiency anemia) 11/13/2016   S/P lumbar spinal fusion 03/07/2016   Lunotriquetral ligament tear 10/11/2014   Tear of anterior cruciate ligament of knee 10/11/2014   Lumbosacral spondylosis without myelopathy 04/24/2011   Polycythemia vera ([employer]) 04/24/2011   ANKLE SPRAIN 04/21/2009   Past Medical History:  Diagnosis Date   Anxiety    Arthritis    Depression    GERD (gastroesophageal reflux disease)    Hyperlipidemia    Hypertension    Lunotriquetral ligament tear 10/11/2014   Polycythemia vera (HCC)    Polycythemia vera (HCC)    Spinal stenosis    Tear of anterior cruciate ligament of knee 10/11/2014    Family History  Problem Relation Age of Onset    Colon cancer Neg Hx    Colon polyps Neg Hx    Esophageal cancer Neg Hx    Rectal cancer Neg Hx    Stomach cancer Neg Hx     Past Surgical History:  Procedure Laterality Date   COLONOSCOPY     age 46- 3 benign polyps per pt    MAXIMUM ACCESS (MAS)POSTERIOR LUMBAR INTERBODY FUSION (PLIF) 2 LEVEL N/A 03/07/2016   Procedure: LUMBAR FOUR-FIVE,LUMBAR FIVE-SACRAL ONE MAXIMUM ACCESS (MAS) POSTERIOR LUMBAR INTERBODY FUSION (PLIF) 2 LEVEL;  Surgeon: Eustace Moore, MD;  Location: [facility];  Service: Neurosurgery;  Laterality: N/A;   TONSILLECTOMY     and Adenoid   Social History   Occupational History   Not on file  Tobacco Use   Smoking status: Some Days    Packs/day: 1.00    Years: 32.00    Total pack years: 32.00    Types: Cigarettes, Cigars    Start date: 07/14/1979   Smokeless tobacco: Never   Tobacco comments:    "no nicotene, but borrowed ecig from son so may have still using vapor cigarettes  Vaping Use   Vaping Use: Former  Substance and Sexual Activity   Alcohol use: Yes    Alcohol/week: 0.0 standard drinks of alcohol    Comment: 2-3  drinks a month if this- rare    Drug use: No   Sexual activity: Yes

## 2022-03-22 LAB — PREALBUMIN: Prealbumin: 27 mg/dL (ref 21–43)

## 2022-04-20 ENCOUNTER — Other Ambulatory Visit: Payer: Self-pay | Admitting: Hematology & Oncology

## 2022-04-20 DIAGNOSIS — D45 Polycythemia vera: Secondary | ICD-10-CM

## 2022-04-23 ENCOUNTER — Other Ambulatory Visit: Payer: Self-pay | Admitting: Hematology & Oncology

## 2022-04-23 DIAGNOSIS — D45 Polycythemia vera: Secondary | ICD-10-CM

## 2022-04-23 MED ORDER — MORPHINE SULFATE 30 MG PO TABS: ORAL_TABLET | ORAL | 0 refills | Status: DC

## 2022-04-23 MED ORDER — MORPHINE SULFATE ER 60 MG PO TBCR: 60.0000 mg | EXTENDED_RELEASE_TABLET | Freq: Two times a day (BID) | ORAL | 0 refills | Status: DC

## 2022-05-21 ENCOUNTER — Other Ambulatory Visit: Payer: Self-pay | Admitting: Hematology & Oncology

## 2022-05-21 DIAGNOSIS — D45 Polycythemia vera: Secondary | ICD-10-CM

## 2022-05-22 MED ORDER — MORPHINE SULFATE ER 60 MG PO TBCR: 60.0000 mg | EXTENDED_RELEASE_TABLET | Freq: Two times a day (BID) | ORAL | 0 refills | Status: DC

## 2022-05-22 MED ORDER — MORPHINE SULFATE 30 MG PO TABS: ORAL_TABLET | ORAL | 0 refills | Status: DC

## 2022-06-01 ENCOUNTER — Emergency Department (HOSPITAL_COMMUNITY): Payer: Medicare HMO

## 2022-06-01 ENCOUNTER — Inpatient Hospital Stay (HOSPITAL_COMMUNITY)
Admission: EM | Admit: 2022-06-01 | Discharge: 2022-06-04 | DRG: 193 | Disposition: A | Discharge status: Home / Self Care | Payer: Medicare HMO | Attending: Internal Medicine | Admitting: Internal Medicine

## 2022-06-01 ENCOUNTER — Encounter (HOSPITAL_COMMUNITY): Payer: Self-pay

## 2022-06-01 ENCOUNTER — Other Ambulatory Visit: Payer: Self-pay

## 2022-06-01 DIAGNOSIS — N179 Acute kidney failure, unspecified: Secondary | ICD-10-CM | POA: Diagnosis present

## 2022-06-01 DIAGNOSIS — G8929 Other chronic pain: Secondary | ICD-10-CM | POA: Diagnosis not present

## 2022-06-01 DIAGNOSIS — J189 Pneumonia, unspecified organism: Secondary | ICD-10-CM | POA: Diagnosis present

## 2022-06-01 DIAGNOSIS — E669 Obesity, unspecified: Secondary | ICD-10-CM | POA: Diagnosis present

## 2022-06-01 DIAGNOSIS — E785 Hyperlipidemia, unspecified: Secondary | ICD-10-CM | POA: Diagnosis present

## 2022-06-01 DIAGNOSIS — Z1152 Encounter for screening for COVID-19: Secondary | ICD-10-CM | POA: Diagnosis not present

## 2022-06-01 DIAGNOSIS — J101 Influenza due to other identified influenza virus with other respiratory manifestations: Principal | ICD-10-CM

## 2022-06-01 DIAGNOSIS — F419 Anxiety disorder, unspecified: Secondary | ICD-10-CM | POA: Diagnosis present

## 2022-06-01 DIAGNOSIS — J9601 Acute respiratory failure with hypoxia: Secondary | ICD-10-CM | POA: Diagnosis present

## 2022-06-01 DIAGNOSIS — R0603 Acute respiratory distress: Secondary | ICD-10-CM | POA: Diagnosis present

## 2022-06-01 DIAGNOSIS — Z79899 Other long term (current) drug therapy: Secondary | ICD-10-CM | POA: Diagnosis not present

## 2022-06-01 DIAGNOSIS — F32A Depression, unspecified: Secondary | ICD-10-CM | POA: Diagnosis present

## 2022-06-01 DIAGNOSIS — D45 Polycythemia vera: Secondary | ICD-10-CM | POA: Diagnosis present

## 2022-06-01 DIAGNOSIS — Z6841 Body Mass Index (BMI) 40.0 and over, adult: Secondary | ICD-10-CM | POA: Diagnosis not present

## 2022-06-01 DIAGNOSIS — K219 Gastro-esophageal reflux disease without esophagitis: Secondary | ICD-10-CM | POA: Diagnosis present

## 2022-06-01 DIAGNOSIS — G4730 Sleep apnea, unspecified: Secondary | ICD-10-CM | POA: Diagnosis present

## 2022-06-01 DIAGNOSIS — Z7982 Long term (current) use of aspirin: Secondary | ICD-10-CM | POA: Diagnosis not present

## 2022-06-01 DIAGNOSIS — F17210 Nicotine dependence, cigarettes, uncomplicated: Secondary | ICD-10-CM | POA: Diagnosis present

## 2022-06-01 DIAGNOSIS — J1000 Influenza due to other identified influenza virus with unspecified type of pneumonia: Secondary | ICD-10-CM | POA: Diagnosis present

## 2022-06-01 DIAGNOSIS — Z981 Arthrodesis status: Secondary | ICD-10-CM

## 2022-06-01 DIAGNOSIS — F17290 Nicotine dependence, other tobacco product, uncomplicated: Secondary | ICD-10-CM | POA: Diagnosis present

## 2022-06-01 DIAGNOSIS — I10 Essential (primary) hypertension: Secondary | ICD-10-CM | POA: Diagnosis present

## 2022-06-01 DIAGNOSIS — R0902 Hypoxemia: Secondary | ICD-10-CM

## 2022-06-01 DIAGNOSIS — Z720 Tobacco use: Secondary | ICD-10-CM | POA: Diagnosis not present

## 2022-06-01 LAB — COMPREHENSIVE METABOLIC PANEL
ALT: 36 U/L (ref 0–44)
AST: 43 U/L — ABNORMAL HIGH (ref 15–41)
Albumin: 3.4 g/dL — ABNORMAL LOW (ref 3.5–5.0)
Alkaline Phosphatase: 72 U/L (ref 38–126)
Anion gap: 9 (ref 5–15)
BUN: 22 mg/dL (ref 8–23)
CO2: 32 mmol/L (ref 22–32)
Calcium: 7.7 mg/dL — ABNORMAL LOW (ref 8.9–10.3)
Chloride: 95 mmol/L — ABNORMAL LOW (ref 98–111)
Creatinine, Ser: 1.78 mg/dL — ABNORMAL HIGH (ref 0.61–1.24)
GFR, Estimated: 43 mL/min — ABNORMAL LOW (ref >60)
Glucose, Bld: 126 mg/dL — ABNORMAL HIGH (ref 70–99)
Potassium: 3.7 mmol/L (ref 3.5–5.1)
Sodium: 136 mmol/L (ref 135–145)
Total Bilirubin: 0.4 mg/dL (ref 0.3–1.2)
Total Protein: 7.5 g/dL (ref 6.5–8.1)

## 2022-06-01 LAB — CBC WITH DIFFERENTIAL/PLATELET
Abs Immature Granulocytes: 0.02 10*3/uL (ref 0.00–0.07)
Basophils Absolute: 0 10*3/uL (ref 0.0–0.1)
Basophils Relative: 0 %
Eosinophils Absolute: 0 10*3/uL (ref 0.0–0.5)
Eosinophils Relative: 0 %
HCT: 43.9 % (ref 39.0–52.0)
Hemoglobin: 13.7 g/dL (ref 13.0–17.0)
Immature Granulocytes: 0 %
Lymphocytes Relative: 17 %
Lymphs Abs: 0.9 10*3/uL (ref 0.7–4.0)
MCH: 27.2 pg (ref 26.0–34.0)
MCHC: 31.2 g/dL (ref 30.0–36.0)
MCV: 87.3 fL (ref 80.0–100.0)
Monocytes Absolute: 0.8 10*3/uL (ref 0.1–1.0)
Monocytes Relative: 14 %
Neutro Abs: 3.8 10*3/uL (ref 1.7–7.7)
Neutrophils Relative %: 69 %
Platelets: 160 10*3/uL (ref 150–400)
RBC: 5.03 MIL/uL (ref 4.22–5.81)
RDW: 14.3 % (ref 11.5–15.5)
WBC: 5.6 10*3/uL (ref 4.0–10.5)
nRBC: 0 % (ref 0.0–0.2)

## 2022-06-01 LAB — RESP PANEL BY RT-PCR (RSV, FLU A&B, COVID)  RVPGX2
Influenza A by PCR: POSITIVE — AB
Influenza B by PCR: NEGATIVE
Resp Syncytial Virus by PCR: NEGATIVE
SARS Coronavirus 2 by RT PCR: NEGATIVE

## 2022-06-01 LAB — TROPONIN I (HIGH SENSITIVITY)
Troponin I (High Sensitivity): 33 ng/L — ABNORMAL HIGH (ref <18)
Troponin I (High Sensitivity): 26 ng/L — ABNORMAL HIGH (ref <18)

## 2022-06-01 LAB — BRAIN NATRIURETIC PEPTIDE: B Natriuretic Peptide: 105.7 pg/mL — ABNORMAL HIGH (ref 0.0–100.0)

## 2022-06-01 LAB — MRSA NEXT GEN BY PCR, NASAL: MRSA by PCR Next Gen: NOT DETECTED

## 2022-06-01 MED ORDER — ACETAMINOPHEN 325 MG PO TABS: 650.0000 mg | ORAL_TABLET | Freq: Four times a day (QID) | ORAL | Status: DC | PRN

## 2022-06-01 MED ORDER — HYDROCODONE-ACETAMINOPHEN 5-325 MG PO TABS: 1.0000 | ORAL_TABLET | ORAL | Status: DC | PRN

## 2022-06-01 MED ORDER — GUAIFENESIN ER 600 MG PO TB12
600.0000 mg | ORAL_TABLET | Freq: Two times a day (BID) | ORAL | Status: DC
  Administered 2022-06-01 – 2022-06-04 (×6): 600 mg via ORAL
  Filled 2022-06-01 (×6): qty 1

## 2022-06-01 MED ORDER — HEPARIN SODIUM (PORCINE) 5000 UNIT/ML IJ SOLN
5000.0000 [IU] | Freq: Three times a day (TID) | INTRAMUSCULAR | Status: DC
  Administered 2022-06-01 – 2022-06-04 (×8): 5000 [IU] via SUBCUTANEOUS
  Filled 2022-06-01 (×8): qty 1

## 2022-06-01 MED ORDER — MORPHINE SULFATE 30 MG PO TABS
30.0000 mg | ORAL_TABLET | Freq: Four times a day (QID) | ORAL | Status: DC | PRN
  Administered 2022-06-02: 60 mg via ORAL
  Filled 2022-06-01: qty 1
  Filled 2022-06-01: qty 2

## 2022-06-01 MED ORDER — POLYETHYLENE GLYCOL 3350 17 G PO PACK: 17.0000 g | PACK | Freq: Every day | ORAL | Status: DC | PRN

## 2022-06-01 MED ORDER — PANTOPRAZOLE SODIUM 40 MG PO TBEC
40.0000 mg | DELAYED_RELEASE_TABLET | Freq: Every day | ORAL | Status: DC
  Administered 2022-06-01 – 2022-06-04 (×4): 40 mg via ORAL
  Filled 2022-06-01 (×4): qty 1

## 2022-06-01 MED ORDER — SODIUM CHLORIDE 0.9 % IV SOLN
500.0000 mg | INTRAVENOUS | Status: DC
  Administered 2022-06-02 – 2022-06-03 (×2): 500 mg via INTRAVENOUS
  Filled 2022-06-01 (×3): qty 5

## 2022-06-01 MED ORDER — SODIUM CHLORIDE 0.9 % IV SOLN
1.0000 g | Freq: Once | INTRAVENOUS | Status: AC
  Administered 2022-06-01: 1 g via INTRAVENOUS
  Filled 2022-06-01: qty 10

## 2022-06-01 MED ORDER — ALBUTEROL SULFATE (2.5 MG/3ML) 0.083% IN NEBU
2.5000 mg | INHALATION_SOLUTION | Freq: Four times a day (QID) | RESPIRATORY_TRACT | Status: DC
  Administered 2022-06-01 – 2022-06-02 (×4): 2.5 mg via RESPIRATORY_TRACT
  Filled 2022-06-01 (×5): qty 3

## 2022-06-01 MED ORDER — SODIUM CHLORIDE 0.9 % IV SOLN
500.0000 mg | Freq: Once | INTRAVENOUS | Status: AC
  Administered 2022-06-01: 500 mg via INTRAVENOUS
  Filled 2022-06-01: qty 5

## 2022-06-01 MED ORDER — ONDANSETRON HCL 4 MG/2ML IJ SOLN: 4.0000 mg | Freq: Four times a day (QID) | INTRAMUSCULAR | Status: DC | PRN

## 2022-06-01 MED ORDER — ADULT MULTIVITAMIN W/MINERALS CH
1.0000 | ORAL_TABLET | Freq: Every day | ORAL | Status: DC
  Administered 2022-06-01 – 2022-06-04 (×4): 1 via ORAL
  Filled 2022-06-01 (×4): qty 1

## 2022-06-01 MED ORDER — ACETAMINOPHEN 650 MG RE SUPP: 650.0000 mg | Freq: Four times a day (QID) | RECTAL | Status: DC | PRN

## 2022-06-01 MED ORDER — SODIUM CHLORIDE 0.9 % IV SOLN
INTRAVENOUS | Status: DC
  Administered 2022-06-02: 1000 mL via INTRAVENOUS

## 2022-06-01 MED ORDER — OSELTAMIVIR PHOSPHATE 75 MG PO CAPS
75.0000 mg | ORAL_CAPSULE | Freq: Once | ORAL | Status: AC
  Administered 2022-06-01: 75 mg via ORAL
  Filled 2022-06-01: qty 1

## 2022-06-01 MED ORDER — METOPROLOL TARTRATE 5 MG/5ML IV SOLN: 5.0000 mg | Freq: Four times a day (QID) | INTRAVENOUS | Status: DC | PRN

## 2022-06-01 MED ORDER — ALPRAZOLAM 0.5 MG PO TABS
1.0000 mg | ORAL_TABLET | Freq: Three times a day (TID) | ORAL | Status: DC | PRN
  Administered 2022-06-02 – 2022-06-03 (×2): 1 mg via ORAL
  Filled 2022-06-01 (×2): qty 2

## 2022-06-01 MED ORDER — OSELTAMIVIR PHOSPHATE 75 MG PO CAPS
75.0000 mg | ORAL_CAPSULE | Freq: Two times a day (BID) | ORAL | Status: DC
  Administered 2022-06-02 – 2022-06-04 (×5): 75 mg via ORAL
  Filled 2022-06-01 (×5): qty 1

## 2022-06-01 MED ORDER — SODIUM CHLORIDE 0.9 % IV SOLN
2.0000 g | INTRAVENOUS | Status: DC
  Administered 2022-06-02 – 2022-06-03 (×2): 2 g via INTRAVENOUS
  Filled 2022-06-01 (×2): qty 20

## 2022-06-01 MED ORDER — ONDANSETRON HCL 4 MG PO TABS: 4.0000 mg | ORAL_TABLET | Freq: Four times a day (QID) | ORAL | Status: DC | PRN

## 2022-06-01 MED ORDER — CALCIUM CARBONATE ANTACID 500 MG PO CHEW
1.0000 | CHEWABLE_TABLET | Freq: Every day | ORAL | Status: DC
  Administered 2022-06-02 – 2022-06-04 (×3): 200 mg via ORAL
  Filled 2022-06-01 (×3): qty 1

## 2022-06-01 MED ORDER — MORPHINE SULFATE ER 30 MG PO TBCR
60.0000 mg | EXTENDED_RELEASE_TABLET | Freq: Two times a day (BID) | ORAL | Status: DC
  Administered 2022-06-01 – 2022-06-04 (×6): 60 mg via ORAL
  Filled 2022-06-01: qty 4
  Filled 2022-06-01 (×2): qty 2
  Filled 2022-06-01: qty 4
  Filled 2022-06-01 (×2): qty 2

## 2022-06-01 MED ORDER — SENNOSIDES-DOCUSATE SODIUM 8.6-50 MG PO TABS: 1.0000 | ORAL_TABLET | Freq: Every evening | ORAL | Status: DC | PRN

## 2022-06-01 MED ORDER — NICOTINE 14 MG/24HR TD PT24
14.0000 mg | MEDICATED_PATCH | TRANSDERMAL | Status: DC
  Administered 2022-06-01 – 2022-06-03 (×3): 14 mg via TRANSDERMAL
  Filled 2022-06-01 (×3): qty 1

## 2022-06-01 NOTE — ED Provider Triage Note (Signed)
Emergency Medicine Provider Triage Evaluation Note  LUQMAN PERRELLI , a 61 y.o. male  was evaluated in triage.  Pt complains of cough and  shortness of breath.  Pt's 02 low in primary Md's office.  Pt on 4 liters now.  Possible pneumonia   Review of Systems  Positive: cough Negative: fever  Physical Exam  BP 131/70 (BP Location: Left Arm)   Pulse 83   Temp 98.9 F (37.2 C) (Oral)   Resp 20   SpO2 95% Comment: on 4L [place] Gen:   Awake, no distress   Resp:  Normal effort, rhonchi  MSK:   Moves extremities without difficulty Other:    Medical Decision Making  Medically screening exam initiated at 2:29 PM.  Appropriate orders placed.  Kaspar Albornoz Deremer was informed that the remainder of the evaluation will be completed by another provider, this initial triage assessment does not replace that evaluation, and the importance of remaining in the ED until their evaluation is complete.     Fransico Meadow, [place] 06/01/22 1433

## 2022-06-01 NOTE — ED Triage Notes (Signed)
Pt arrived via EMS, from UC, SOB x3 days. Chest xray and covid swab negative. States he was hypoxic in the 70s per UC. EMS found pt mid 80% on RA, placed on 4L  with good effect.

## 2022-06-01 NOTE — H&P (Signed)
History and Physical    Patient: Jacob Bradford LKG:[id number] DOB: Jan 16, 1961 DOA: 06/01/2022 DOS: the patient was seen and examined on 06/01/2022 PCP: Everardo Beals, NP  Patient coming from: Home  Chief Complaint:  Chief Complaint  Patient presents with   Shortness of Breath   HPI: Jacob Bradford is a 62 y.o. male with medical history significant of polycytopenia vera, hypertension anxiety, cocaine dependence, who presents to the emergency department from PCPs office on account of cough, shortness of breath and generalized body weakness.  States that his symptoms started approximately 4 days ago when he started having a cough which was nonproductive and generalized bodyaches and pains.  Made an appointment with his PCP to take a flu shot today.  During his office visit he was noted to be hypoxic, he received albuterol treatment but his oxygen saturations remained between 82 and 88.  He was transferred to the ER for management.  At the ER he was noted to be hypoxic as well requiring 5 L of oxygen.  He was noted to be influenza positive.  Chest x-ray showed patchy peripheral left lung base opacity, possible pneumonia, no pleural effusion, normal cardiomediastinal silhouette, no pneumothorax.  Denies abdominal pain, nausea, vomiting, headaches, visual changes, lightheadedness, chest pain, palpitations fevers or chills. Review of Systems: As mentioned in the history of present illness. All other systems reviewed and are negative. Past Medical History:  Diagnosis Date   Anxiety    Arthritis    Depression    GERD (gastroesophageal reflux disease)    Hyperlipidemia    Hypertension    Lunotriquetral ligament tear 10/11/2014   Polycythemia vera ([place])    Polycythemia vera ([place])    Spinal stenosis    Tear of anterior cruciate ligament of knee 10/11/2014   Past Surgical History:  Procedure Laterality Date   COLONOSCOPY     age 14- 3 benign polyps per pt    MAXIMUM ACCESS (MAS)POSTERIOR  LUMBAR INTERBODY FUSION (PLIF) 2 LEVEL N/A 03/07/2016   Procedure: LUMBAR FOUR-FIVE,LUMBAR FIVE-SACRAL ONE MAXIMUM ACCESS (MAS) POSTERIOR LUMBAR INTERBODY FUSION (PLIF) 2 LEVEL;  Surgeon: Eustace Moore, MD;  Location: [facility];  Service: Neurosurgery;  Laterality: N/A;   TONSILLECTOMY     and Adenoid   Social History:  reports that he has been smoking cigarettes and cigars. He started smoking about 42 years ago. He has a 32.00 pack-year smoking history. He has never used smokeless tobacco. He reports current alcohol use. He reports that he does not use drugs.  No Known Allergies  Family History  Problem Relation Age of Onset   Colon cancer Neg Hx    Colon polyps Neg Hx    Esophageal cancer Neg Hx    Rectal cancer Neg Hx    Stomach cancer Neg Hx     Prior to Admission medications   Medication Sig Start Date End Date Taking? Authorizing Provider  acetaminophen (TYLENOL) 500 MG tablet Take 1,000 mg by mouth every 6 (six) hours as needed for moderate pain.    [provider]  ALPRAZolam Duanne Moron) 1 MG tablet Take 1 tablet (1 mg total) by mouth 3 (three) times daily as needed for anxiety. Patient taking differently: Take 0.5-1 mg by mouth 3 (three) times daily as needed for anxiety. 11/17/15   Volanda Napoleon, MD  aspirin 81 MG tablet Take 1 tablet (81 mg total) by mouth daily. 09/25/13   Volanda Napoleon, MD  Aspirin-Acetaminophen-Caffeine (GOODYS EXTRA STRENGTH) [phone] MG PACK Take by  mouth.    [provider]  calcium carbonate (TUMS - DOSED IN MG ELEMENTAL CALCIUM) 500 MG chewable tablet Chew 1 tablet by mouth daily.    [provider]  esomeprazole (NEXIUM) 40 MG capsule Take 40 mg by mouth daily at 12 noon.    [provider]  lisinopril (PRINIVIL,ZESTRIL) 40 MG tablet Take 40 mg by mouth daily.    [provider]  losartan-hydrochlorothiazide (HYZAAR) 100-12.5 MG tablet Take 1 tablet by mouth daily. 11/04/19   [provider]  morphine  (MS CONTIN) 60 MG 12 hr tablet Take 1 tablet (60 mg total) by mouth 2 (two) times daily. 05/22/22   Volanda Napoleon, MD  morphine (MSIR) 30 MG tablet Take 1-2 every 6 hrs as needed for pain 05/22/22   Volanda Napoleon, MD  omeprazole (PRILOSEC) 20 MG capsule Take 20 mg by mouth. 10/06/20   [provider]  polyethylene glycol (MIRALAX / GLYCOLAX) packet Take 17 g by mouth daily as needed for moderate constipation.    [provider]    Physical Exam: Vitals:   06/01/22 1430 06/01/22 1530 06/01/22 1600 06/01/22 1737  BP: 115/68 122/81 133/64   Pulse: 84 72 84 79  Resp:  20  16  Temp:  97.8 F (36.6 C)    TempSrc:  Oral    SpO2: 100% 92% 91% 93%   Physical Exam Vitals and nursing note reviewed.  Constitutional:      Appearance: He is well-developed.  HENT:     Head: Normocephalic and atraumatic.  Eyes:     Extraocular Movements: Extraocular movements intact.     Pupils: Pupils are equal, round, and reactive to light.  Cardiovascular:     Rate and Rhythm: Normal rate.  Pulmonary:     Effort: Pulmonary effort is normal.     Comments: Diminished  breath sounds bilaterally Abdominal:     General: Bowel sounds are normal.     Palpations: Abdomen is soft.  Musculoskeletal:        General: Normal range of motion.     Cervical back: Normal range of motion and neck supple.  Skin:    General: Skin is warm and dry.     Capillary Refill: Capillary refill takes less than 2 seconds.  Neurological:     General: No focal deficit present.     Mental Status: He is alert.  Psychiatric:        Mood and Affect: Mood normal.        Behavior: Behavior normal.     Data Reviewed:  There are no new results to review at this time.  Assessment and Plan: Principal problem Influenza Community Acquired Pneumonia  Active problem Acute respiratory failure with hypoxia AKI Anxiety Polycytemia vera Hypertension   Influenza Community-acquired pneumonia -Presents with a  dry cough,  shortness of breath, myalgias and arthralgias with a positive influenza A. X-ray concerning for pneumonia.  He has been started on ceftriaxone and azithromycin, follow-up blood cultures. Check MRSA, Legionella. Droplet precaution  Acute respiratory failure with hypoxia -Secondary to influenza/pneumonia, currently on 4 L of nasal cannula.  Wean as tolerated.  AKI -Likely prerenal in nature.  Has had poor oral intake.  Continue IV hydration at 100 cc/h.  Trend daily serum creatinine levels. -Hold Nephrotoxins-ACE/ARBs  Polycytemia Vera-follows with oncology.  Continue pain management with oral morphine.  Hypertension -Hold his losartan-hydrochlorothiazide for now -Ordered as needed hydralazine for SBP greater than 170.   Nicotine dependence -  ordered nicotine replacement.  Obesity Lifestyle and dietary modifications,  Admit to Inpatient    Advance Care Planning:   Code Status: Full Code   Family Communication: None at bedside   Author: Cristela Felt, MD 06/01/2022 5:58 PM  For on call review [email or web address].

## 2022-06-01 NOTE — ED Provider Notes (Signed)
[facility] DEPT Provider Note  CSN: [id number] Arrival date & time: 06/01/22 1326  Chief Complaint(s) Shortness of Breath  HPI Jacob Bradford is a 61 y.o. male with past medical history as below, significant for tobacco use, HLD, HTN GERD  who presents to the ED with complaint of dib Pt with cough/ fever/ chills/ malaise over last 4 days. No home o2 use/cpap use, he smokes cigarettes daily. + sick contacts at home w/ similar complaint. Pt went to UC earlier today for eval, RVP and CXR were neg per EMS but pt hypoxic on arrival 75-80% on RA, placed on 4L [place] with improvement, no home O2 use. On arrival pt feels his breathing is improved on the oxygen but still persistent. Cough reported as not productive. No abd pain, n/v, change to bowel/bladder fxn. Reports poor appetite but tolerating liquids as normal.   Past Medical History Past Medical History:  Diagnosis Date   Anxiety    Arthritis    Depression    GERD (gastroesophageal reflux disease)    Hyperlipidemia    Hypertension    Lunotriquetral ligament tear 10/11/2014   Polycythemia vera ([place])    Polycythemia vera ([place])    Spinal stenosis    Tear of anterior cruciate ligament of knee 10/11/2014   Patient Active Problem List   Diagnosis Date Noted   Community acquired pneumonia 06/01/2022   Primary osteoarthritis of right hip 03/21/2022   Body mass index 40.0-44.9, adult ([place]) 03/21/2022   IDA (iron deficiency anemia) 11/13/2016   S/P lumbar spinal fusion 03/07/2016   Lunotriquetral ligament tear 10/11/2014   Tear of anterior cruciate ligament of knee 10/11/2014   Lumbosacral spondylosis without myelopathy 04/24/2011   Polycythemia vera ([place]) 04/24/2011   ANKLE SPRAIN 04/21/2009   Home Medication(s) Prior to Admission medications   Medication Sig Start Date End Date Taking? Authorizing Provider  acetaminophen (TYLENOL) 500 MG tablet Take 1,000 mg by mouth every 6 (six) hours as needed for  moderate pain.    [provider]  ALPRAZolam Duanne Moron) 1 MG tablet Take 1 tablet (1 mg total) by mouth 3 (three) times daily as needed for anxiety. Patient taking differently: Take 0.5-1 mg by mouth 3 (three) times daily as needed for anxiety. 11/17/15   Volanda Napoleon, MD  aspirin 81 MG tablet Take 1 tablet (81 mg total) by mouth daily. 09/25/13   Volanda Napoleon, MD  Aspirin-Acetaminophen-Caffeine (GOODYS EXTRA STRENGTH) [phone] MG PACK Take by mouth.    [provider]  calcium carbonate (TUMS - DOSED IN MG ELEMENTAL CALCIUM) 500 MG chewable tablet Chew 1 tablet by mouth daily.    [provider]  esomeprazole (NEXIUM) 40 MG capsule Take 40 mg by mouth daily at 12 noon.    [provider]  lisinopril (PRINIVIL,ZESTRIL) 40 MG tablet Take 40 mg by mouth daily.    [provider]  losartan-hydrochlorothiazide (HYZAAR) 100-12.5 MG tablet Take 1 tablet by mouth daily. 11/04/19   [provider]  morphine (MS CONTIN) 60 MG 12 hr tablet Take 1 tablet (60 mg total) by mouth 2 (two) times daily. 05/22/22   Volanda Napoleon, MD  morphine (MSIR) 30 MG tablet Take 1-2 every 6 hrs as needed for pain 05/22/22   Volanda Napoleon, MD  omeprazole (PRILOSEC) 20 MG capsule Take 20 mg by mouth. 10/06/20   [provider]  polyethylene glycol (MIRALAX / GLYCOLAX) packet Take 17 g by mouth daily as needed for moderate constipation.  [provider]                                                                                                                                    Past Surgical History Past Surgical History:  Procedure Laterality Date   COLONOSCOPY     age 24- 3 benign polyps per pt    MAXIMUM ACCESS (MAS)POSTERIOR LUMBAR INTERBODY FUSION (PLIF) 2 LEVEL N/A 03/07/2016   Procedure: LUMBAR FOUR-FIVE,LUMBAR FIVE-SACRAL ONE MAXIMUM ACCESS (MAS) POSTERIOR LUMBAR INTERBODY FUSION (PLIF) 2 LEVEL;  Surgeon: Eustace Moore, MD;  Location: [facility];  Service: Neurosurgery;  Laterality: N/A;   TONSILLECTOMY     and Adenoid   Family History Family History  Problem Relation Age of Onset   Colon cancer Neg Hx    Colon polyps Neg Hx    Esophageal cancer Neg Hx    Rectal cancer Neg Hx    Stomach cancer Neg Hx     Social History Social History   Tobacco Use   Smoking status: Some Days    Packs/day: 1.00    Years: 32.00    Total pack years: 32.00    Types: Cigarettes, Cigars    Start date: 07/14/1979   Smokeless tobacco: Never   Tobacco comments:    "no nicotene, but borrowed ecig from son so may have still using vapor cigarettes  Vaping Use   Vaping Use: Former  Substance Use Topics   Alcohol use: Yes    Alcohol/week: 0.0 standard drinks of alcohol    Comment: 2-3  drinks a month if this- rare    Drug use: No   Allergies Patient has no known allergies.  Review of Systems Review of Systems  Constitutional:  Positive for appetite change, chills, fatigue and fever.  HENT:  Negative for facial swelling and trouble swallowing.   Eyes:  Negative for photophobia and visual disturbance.  Respiratory:  Positive for cough and shortness of breath.   Cardiovascular:  Negative for chest pain and palpitations.  Gastrointestinal:  Negative for abdominal pain, nausea and vomiting.  Endocrine: Negative for polydipsia and polyuria.  Genitourinary:  Negative for difficulty urinating and hematuria.  Musculoskeletal:  Negative for gait problem and joint swelling.  Skin:  Negative for pallor and rash.  Neurological:  Negative for syncope and headaches.  Psychiatric/Behavioral:  Negative for agitation and confusion.     Physical Exam Vital Signs  I have reviewed the triage vital signs BP 133/64   Pulse 79   Temp 97.8 F (36.6 C) (Oral)   Resp 16   SpO2 93%  Physical Exam Vitals and nursing note reviewed.  Constitutional:      General: He is not in acute distress.    Appearance: He is well-developed. He is obese.  HENT:      Head: Normocephalic and atraumatic.     Right Ear: External ear normal.     Left Ear:  External ear normal.     Mouth/Throat:     Mouth: Mucous membranes are moist.  Eyes:     General: No scleral icterus. Cardiovascular:     Rate and Rhythm: Normal rate and regular rhythm.     Pulses: Normal pulses.     Heart sounds: Normal heart sounds.  Pulmonary:     Effort: Pulmonary effort is normal. Tachypnea present. No respiratory distress.     Breath sounds: Decreased breath sounds present. No wheezing.  Abdominal:     General: Abdomen is flat.     Palpations: Abdomen is soft.     Tenderness: There is no abdominal tenderness.  Musculoskeletal:        General: Normal range of motion.     Cervical back: Normal range of motion.     Right lower leg: Edema present.     Left lower leg: Edema present.  Skin:    General: Skin is warm and dry.     Capillary Refill: Capillary refill takes less than 2 seconds.  Neurological:     Mental Status: He is alert and oriented to person, place, and time.     GCS: GCS eye subscore is 4. GCS verbal subscore is 5. GCS motor subscore is 6.  Psychiatric:        Mood and Affect: Mood normal.        Behavior: Behavior normal.     ED Results and Treatments Labs (all labs ordered are listed, but only abnormal results are displayed) Labs Reviewed  RESP PANEL BY RT-PCR (RSV, FLU A&B, COVID)  RVPGX2 - Abnormal; Notable for the following components:      Result Value   Influenza A by PCR POSITIVE (*)    All other components within normal limits  COMPREHENSIVE METABOLIC PANEL - Abnormal; Notable for the following components:   Chloride 95 (*)    Glucose, Bld 126 (*)    Creatinine, Ser 1.78 (*)    Calcium 7.7 (*)    Albumin 3.4 (*)    AST 43 (*)    GFR, Estimated 43 (*)    All other components within normal limits  BRAIN NATRIURETIC PEPTIDE - Abnormal; Notable for the following components:   B Natriuretic Peptide 105.7 (*)    All other components within  normal limits  TROPONIN I (HIGH SENSITIVITY) - Abnormal; Notable for the following components:   Troponin I (High Sensitivity) 33 (*)    All other components within normal limits  MRSA NEXT GEN BY PCR, NASAL  CBC WITH DIFFERENTIAL/PLATELET  BASIC METABOLIC PANEL  CBC  LEGIONELLA PNEUMOPHILA SEROGP 1 UR AG  TROPONIN I (HIGH SENSITIVITY)                                                                                                                          Radiology DG Chest 2 View  Result Date: 06/01/2022 CLINICAL DATA:  Shortness of breath EXAM: CHEST - 2 VIEW COMPARISON:  None Available. FINDINGS:  Patchy peripheral left lung base opacity. No pleural effusion. Normal cardiomediastinal silhouette. No pneumothorax IMPRESSION: Patchy peripheral left lung base opacity, possible pneumonia. Radiographic follow-up to resolution is recommended. Electronically Signed   By: Donavan Foil M.D.   On: 06/01/2022 15:20    Pertinent labs & imaging results that were available during my care of the patient were reviewed by me and considered in my medical decision making (see MDM for details).  Medications Ordered in ED Medications  azithromycin (ZITHROMAX) 500 mg in sodium chloride 0.9 % 250 mL IVPB (500 mg Intravenous New Bag/Given 06/01/22 1721)  cefTRIAXone (ROCEPHIN) 2 g in sodium chloride 0.9 % 100 mL IVPB (has no administration in time range)  oseltamivir (TAMIFLU) capsule 75 mg (has no administration in time range)  azithromycin (ZITHROMAX) 500 mg in sodium chloride 0.9 % 250 mL IVPB (has no administration in time range)  heparin injection 5,000 Units (has no administration in time range)  0.9 %  sodium chloride infusion (has no administration in time range)  acetaminophen (TYLENOL) tablet 650 mg (has no administration in time range)    Or  acetaminophen (TYLENOL) suppository 650 mg (has no administration in time range)  senna-docusate (Senokot-S) tablet 1 tablet (has no administration in time  range)  ondansetron (ZOFRAN) tablet 4 mg (has no administration in time range)    Or  ondansetron (ZOFRAN) injection 4 mg (has no administration in time range)  multivitamin with minerals tablet 1 tablet (has no administration in time range)  albuterol (PROVENTIL) (2.5 MG/3ML) 0.083% nebulizer solution 2.5 mg (has no administration in time range)  guaiFENesin (MUCINEX) 12 hr tablet 600 mg (has no administration in time range)  metoprolol tartrate (LOPRESSOR) injection 5 mg (has no administration in time range)  morphine (MS CONTIN) 12 hr tablet 60 mg (has no administration in time range)  morphine (MSIR) tablet 30 mg (has no administration in time range)  ALPRAZolam (XANAX) tablet 1 mg (has no administration in time range)  calcium carbonate (TUMS - dosed in mg elemental calcium) chewable tablet 200 mg of elemental calcium (has no administration in time range)  pantoprazole (PROTONIX) EC tablet 40 mg (has no administration in time range)  polyethylene glycol (MIRALAX / GLYCOLAX) packet 17 g (has no administration in time range)  nicotine (NICODERM CQ - dosed in mg/24 hours) patch 14 mg (has no administration in time range)  cefTRIAXone (ROCEPHIN) 1 g in sodium chloride 0.9 % 100 mL IVPB (0 g Intravenous Stopped 06/01/22 1720)  oseltamivir (TAMIFLU) capsule 75 mg (75 mg Oral Given 06/01/22 1629)                                                                                                                                     Procedures .Critical Care  Performed by: Jeanell Sparrow, DO Authorized by: Jeanell Sparrow, DO   Critical care provider statement:    Critical care time (minutes):  3   Critical care time was exclusive of:  Separately billable procedures and treating other patients   Critical care was necessary to treat or prevent imminent or life-threatening deterioration of the following conditions:  Respiratory failure   Critical care was time spent personally by me on the following  activities:  Development of treatment plan with patient or surrogate, discussions with consultants, evaluation of patient's response to treatment, examination of patient, ordering and review of laboratory studies, ordering and review of radiographic studies, ordering and performing treatments and interventions, pulse oximetry, re-evaluation of patient's condition, review of old charts and obtaining history from patient or surrogate   Care discussed with: admitting provider     (including critical care time)  Medical Decision Making / ED Course   MDM:  Jacob Bradford is a 61 y.o. male with past medical history as above, significant for tobacco use, as above which further complicates the presenting complaint. Pt presents to the ED with complaint of cough/dib/malaise. The complaint involves an extensive differential diagnosis and also carries with it a high risk of complications and morbidity.  Serious etiology was considered. Ddx includes but is not limited to: In my evaluation of this patient's dyspnea my DDx includes, but is not limited to, pneumonia, pulmonary embolism, pneumothorax, pulmonary edema, metabolic acidosis, asthma, COPD, cardiac cause, anemia, anxiety, etc.   Differential diagnosis for adult fever includes but is not exclusive to community-acquired pneumonia, urinary tract infection, acute cholecystitis, viral syndrome, cellulitis, tick bourne disease,  decubitus ulcer, necrotizing fasciitis, meningitis, encephalitis, influenza, etc.  On initial assessment the patient is: breathing comfortably on 5LNC, HDS; when off [place] will become hypoxic/conversational dib Vital signs and nursing notes were reviewed  Clinical Course as of 06/01/22 1813  Fri Jun 01, 2022  1621 Troponin I (High Sensitivity)(!): 33 No chest pain, favor ischemic demand from hypoxia  [SG]    Clinical Course User Index [SG] Wynona Dove A, DO   5:00 PM Pt re-assessed, ongoing dib worsening, will escalate to  HFNC, no o2 use at home  Found to have FLU A and pneumonia, started on broad spectrum abx. AKI also noted, pt reports reduced PO intake last few days, IVF in progress Trop mild elev, EKG wnl  Pt with respiratory distress, AKI, PNA, FLU A; recommend admission, he is agreeable, D/w TRH who accepts for admission       Additional history obtained: -Additional history obtained from ems -External records from outside source obtained and reviewed including: Chart review including previous notes, labs, imaging, consultation notes including primary care notes, prior labs/imaging/home meds    Lab Tests: -I ordered, reviewed, and interpreted labs.   The pertinent results include:   Labs Reviewed  RESP PANEL BY RT-PCR (RSV, FLU A&B, COVID)  RVPGX2 - Abnormal; Notable for the following components:      Result Value   Influenza A by PCR POSITIVE (*)    All other components within normal limits  COMPREHENSIVE METABOLIC PANEL - Abnormal; Notable for the following components:   Chloride 95 (*)    Glucose, Bld 126 (*)    Creatinine, Ser 1.78 (*)    Calcium 7.7 (*)    Albumin 3.4 (*)    AST 43 (*)    GFR, Estimated 43 (*)    All other components within normal limits  BRAIN NATRIURETIC PEPTIDE - Abnormal; Notable for the following components:   B Natriuretic Peptide 105.7 (*)    All other components within normal limits  TROPONIN I (  HIGH SENSITIVITY) - Abnormal; Notable for the following components:   Troponin I (High Sensitivity) 33 (*)    All other components within normal limits  MRSA NEXT GEN BY PCR, NASAL  CBC WITH DIFFERENTIAL/PLATELET  BASIC METABOLIC PANEL  CBC  LEGIONELLA PNEUMOPHILA SEROGP 1 UR AG  TROPONIN I (HIGH SENSITIVITY)    Notable for flu A positive ,AKI  EKG   EKG Interpretation  Date/Time:  Friday June 01 2022 16:34:01 EST Ventricular Rate:  74 PR Interval:  151 QRS Duration: 112 QT Interval:  443 QTC Calculation: 492 R Axis:   69 Text  Interpretation: Sinus rhythm Right atrial enlargement Incomplete right bundle branch block Borderline prolonged QT interval similar to prior no stemi Confirmed by Wynona Dove ([id number]) on 06/01/2022 6:12:15 PM         Imaging Studies ordered: I ordered imaging studies including CXR On my interpretation imaging demonstrates PNA I independently visualized and interpreted imaging. I agree with the radiologist interpretation   Medicines ordered and prescription drug management: Meds ordered this encounter  Medications   cefTRIAXone (ROCEPHIN) 1 g in sodium chloride 0.9 % 100 mL IVPB    Order Specific Question:   Antibiotic Indication:    Answer:   CAP   azithromycin (ZITHROMAX) 500 mg in sodium chloride 0.9 % 250 mL IVPB    Order Specific Question:   Antibiotic Indication:    Answer:   CAP   oseltamivir (TAMIFLU) capsule 75 mg   cefTRIAXone (ROCEPHIN) 2 g in sodium chloride 0.9 % 100 mL IVPB    Order Specific Question:   Antibiotic Indication:    Answer:   CAP   oseltamivir (TAMIFLU) capsule 75 mg   azithromycin (ZITHROMAX) 500 mg in sodium chloride 0.9 % 250 mL IVPB    Order Specific Question:   Antibiotic Indication:    Answer:   CAP   heparin injection 5,000 Units   0.9 %  sodium chloride infusion   OR Linked Order Group    acetaminophen (TYLENOL) tablet 650 mg    acetaminophen (TYLENOL) suppository 650 mg   DISCONTD: HYDROcodone-acetaminophen (NORCO/VICODIN) 5-325 MG per tablet 1-2 tablet   senna-docusate (Senokot-S) tablet 1 tablet   OR Linked Order Group    ondansetron (ZOFRAN) tablet 4 mg    ondansetron (ZOFRAN) injection 4 mg   multivitamin with minerals tablet 1 tablet   albuterol (PROVENTIL) (2.5 MG/3ML) 0.083% nebulizer solution 2.5 mg   guaiFENesin (MUCINEX) 12 hr tablet 600 mg   metoprolol tartrate (LOPRESSOR) injection 5 mg   morphine (MS CONTIN) 12 hr tablet 60 mg   morphine (MSIR) tablet 30 mg    Take 1-2 every 6 hrs as needed for pain     ALPRAZolam (XANAX)  tablet 1 mg   calcium carbonate (TUMS - dosed in mg elemental calcium) chewable tablet 200 mg of elemental calcium   pantoprazole (PROTONIX) EC tablet 40 mg   polyethylene glycol (MIRALAX / GLYCOLAX) packet 17 g   nicotine (NICODERM CQ - dosed in mg/24 hours) patch 14 mg    -I have reviewed the patients home medicines and have made adjustments as needed   Consultations Obtained: I requested consultation with the na,  and discussed lab and imaging findings as well as pertinent plan - they recommend: na   Cardiac Monitoring: The patient was maintained on a cardiac monitor.  I personally viewed and interpreted the cardiac monitored which showed an underlying rhythm of: NSR  Social Determinants of Health:  Diagnosis or treatment significantly  limited by social determinants of health: current smoker and obesity   Reevaluation: After the interventions noted above, I reevaluated the patient and found that they have stayed the same  Co morbidities that complicate the patient evaluation  Past Medical History:  Diagnosis Date   Anxiety    Arthritis    Depression    GERD (gastroesophageal reflux disease)    Hyperlipidemia    Hypertension    Lunotriquetral ligament tear 10/11/2014   Polycythemia vera ([place])    Polycythemia vera ([place])    Spinal stenosis    Tear of anterior cruciate ligament of knee 10/11/2014      Dispostion: Disposition decision including need for hospitalization was considered, and patient admitted to the hospital.    Final Clinical Impression(s) / ED Diagnoses Final diagnoses:  Influenza A  Respiratory distress  Hypoxia  Community acquired pneumonia, unspecified laterality  AKI (acute kidney injury) ([place])     This chart was dictated using voice recognition software.  Despite best efforts to proofread,  errors can occur which can change the documentation meaning.    Jeanell Sparrow, DO 06/01/22 [phone]

## 2022-06-02 DIAGNOSIS — G8929 Other chronic pain: Secondary | ICD-10-CM

## 2022-06-02 DIAGNOSIS — D45 Polycythemia vera: Secondary | ICD-10-CM

## 2022-06-02 DIAGNOSIS — J189 Pneumonia, unspecified organism: Secondary | ICD-10-CM | POA: Diagnosis not present

## 2022-06-02 DIAGNOSIS — J101 Influenza due to other identified influenza virus with other respiratory manifestations: Secondary | ICD-10-CM | POA: Diagnosis not present

## 2022-06-02 DIAGNOSIS — J1000 Influenza due to other identified influenza virus with unspecified type of pneumonia: Secondary | ICD-10-CM | POA: Diagnosis not present

## 2022-06-02 DIAGNOSIS — Z720 Tobacco use: Secondary | ICD-10-CM

## 2022-06-02 LAB — CBC
HCT: 42.8 % (ref 39.0–52.0)
Hemoglobin: 13 g/dL (ref 13.0–17.0)
MCH: 27.1 pg (ref 26.0–34.0)
MCHC: 30.4 g/dL (ref 30.0–36.0)
MCV: 89.4 fL (ref 80.0–100.0)
Platelets: 144 10*3/uL — ABNORMAL LOW (ref 150–400)
RBC: 4.79 MIL/uL (ref 4.22–5.81)
RDW: 14.2 % (ref 11.5–15.5)
WBC: 5.5 10*3/uL (ref 4.0–10.5)
nRBC: 0 % (ref 0.0–0.2)

## 2022-06-02 LAB — BASIC METABOLIC PANEL
Anion gap: 5 (ref 5–15)
BUN: 21 mg/dL (ref 8–23)
CO2: 35 mmol/L — ABNORMAL HIGH (ref 22–32)
Calcium: 7.2 mg/dL — ABNORMAL LOW (ref 8.9–10.3)
Chloride: 98 mmol/L (ref 98–111)
Creatinine, Ser: 1.4 mg/dL — ABNORMAL HIGH (ref 0.61–1.24)
GFR, Estimated: 57 mL/min — ABNORMAL LOW (ref >60)
Glucose, Bld: 124 mg/dL — ABNORMAL HIGH (ref 70–99)
Potassium: 4.1 mmol/L (ref 3.5–5.1)
Sodium: 138 mmol/L (ref 135–145)

## 2022-06-02 MED ORDER — IPRATROPIUM-ALBUTEROL 0.5-2.5 (3) MG/3ML IN SOLN
3.0000 mL | Freq: Three times a day (TID) | RESPIRATORY_TRACT | Status: DC
  Administered 2022-06-02: 3 mL via RESPIRATORY_TRACT
  Filled 2022-06-02: qty 3

## 2022-06-02 MED ORDER — IPRATROPIUM-ALBUTEROL 0.5-2.5 (3) MG/3ML IN SOLN
3.0000 mL | Freq: Four times a day (QID) | RESPIRATORY_TRACT | Status: DC
  Administered 2022-06-03 (×2): 3 mL via RESPIRATORY_TRACT
  Filled 2022-06-02 (×2): qty 3

## 2022-06-02 NOTE — Progress Notes (Signed)
PROGRESS NOTE    Jacob Bradford  JJK:[id number] DOB: [birth year] DOA: 06/01/2022 PCP: Everardo Beals, NP    Brief Narrative:  61 year old with history of polycythemia vera, hypertension, anxiety presented to the emergency room from PCP office with cough and shortness of breath generalized body ache for about 4 days.  He had gone to PCP office to take a flu shot, however he was found with viral illness, saturation 82% on room air and was positive for influenza A.  In the emergency room admitted due to new oxygen requirement.  Chest x-ray showed left lower lobe pneumonia.   Assessment & Plan:   Influenza A infection Left lower lobe pneumonia Acute respiratory failure with hypoxemia:  Agree with admission given severity of symptoms. On Rocephin azithromycin.  Continue. Tamiflu day 2/5. Chest physiotherapy, incentive spirometry, deep breathing exercises, sputum induction, mucolytic's and bronchodilators. Sputum cultures, blood cultures, Legionella and streptococcal antigen. Supplemental oxygen to keep saturations more than 90%.  AKI: Improving.  Continue maintenance IV fluids. Polycythemia vera: Stable. Hypertension: Blood pressure stable. Smoker: Nicotine patch.  Counseling done.   DVT prophylaxis: heparin injection 5,000 Units Start: 06/01/22 2200   Code Status: Full code Family Communication: None at the bedside Disposition Plan: Status is: Inpatient Remains inpatient appropriate because: Pneumonia, new oxygen requirement     Consultants:  Oncology  Procedures:  None  Antimicrobials:  Rocephin azithromycin and Tamiflu 12/29----   Subjective: Patient seen and examined.  In the morning rounds she was still in the emergency room.  Still has some difficulty breathing on mobility, however feels much better than yesterday.  He was on 6 L of oxygen on my exam and able to talk in full sentences.  Afebrile overnight.  Objective: Vitals:   06/02/22 1100 06/02/22 1203  06/02/22 1401 06/02/22 1401  BP: 123/63 (!) 143/64  133/78  Pulse: 83   71  Resp: '13 16  16  '$ Temp:  98.3 F (36.8 C)  98 F (36.7 C)  TempSrc:  Oral  Oral  SpO2: 91% 100% 92% 94%  Weight:      Height:        Intake/Output Summary (Last 24 hours) at 06/02/2022 1437 Last data filed at 06/02/2022 0020 Gross per 24 hour  Intake 642.83 ml  Output --  Net 642.83 ml   Filed Weights   06/01/22 2051  Weight: 133.4 kg    Examination:  General exam: Appears calm and comfortable  Respiratory system: Clear to auscultation. Respiratory effort normal. SpO2: 94 % O2 Flow Rate (L/min): 12 L/min  Cardiovascular system: S1 & S2 heard, RRR. No JVD, murmurs, rubs, gallops or clicks. No pedal edema. Gastrointestinal system: Soft.  Nontender.  Obese and pendulous. Central nervous system: Alert and oriented. No focal neurological deficits. Extremities: Symmetric 5 x 5 power. Skin: No rashes, lesions or ulcers Psychiatry: Judgement and insight appear normal. Mood & affect appropriate.     Data Reviewed: I have personally reviewed following labs and imaging studies  CBC: Recent Labs  Lab 06/01/22 1526 06/02/22 0356  WBC 5.6 5.5  NEUTROABS 3.8  --   HGB 13.7 13.0  HCT 43.9 42.8  MCV 87.3 89.4  PLT 160 371*   Basic Metabolic Panel: Recent Labs  Lab 06/01/22 1526 06/02/22 0356  NA 136 138  K 3.7 4.1  CL 95* 98  CO2 32 35*  GLUCOSE 126* 124*  BUN 22 21  CREATININE 1.78* 1.40*  CALCIUM 7.7* 7.2*   GFR: Estimated Creatinine Clearance: 77.2 mL/min (  A) (by C-G formula based on SCr of 1.4 mg/dL (H)). Liver Function Tests: Recent Labs  Lab 06/01/22 1526  AST 43*  ALT 36  ALKPHOS 72  BILITOT 0.4  PROT 7.5  ALBUMIN 3.4*   No results for input(s): "LIPASE", "AMYLASE" in the last 168 hours. No results for input(s): "AMMONIA" in the last 168 hours. Coagulation Profile: No results for input(s): "INR", "PROTIME" in the last 168 hours. Cardiac Enzymes: No results for  input(s): "CKTOTAL", "CKMB", "CKMBINDEX", "TROPONINI" in the last 168 hours. BNP (last 3 results) No results for input(s): "PROBNP" in the last 8760 hours. HbA1C: No results for input(s): "HGBA1C" in the last 72 hours. CBG: No results for input(s): "GLUCAP" in the last 168 hours. Lipid Profile: No results for input(s): "CHOL", "HDL", "LDLCALC", "TRIG", "CHOLHDL", "LDLDIRECT" in the last 72 hours. Thyroid Function Tests: No results for input(s): "TSH", "T4TOTAL", "FREET4", "T3FREE", "THYROIDAB" in the last 72 hours. Anemia Panel: No results for input(s): "VITAMINB12", "FOLATE", "FERRITIN", "TIBC", "IRON", "RETICCTPCT" in the last 72 hours. Sepsis Labs: No results for input(s): "PROCALCITON", "LATICACIDVEN" in the last 168 hours.  Recent Results (from the past 240 hour(s))  Resp panel by RT-PCR (RSV, Flu A&B, Covid) Anterior Nasal Swab     Status: Abnormal   Collection Time: 06/01/22  2:34 PM   Specimen: Anterior Nasal Swab  Result Value Ref Range Status   SARS Coronavirus 2 by RT PCR NEGATIVE NEGATIVE Final    Comment: (NOTE) SARS-CoV-2 target nucleic acids are NOT DETECTED.  The SARS-CoV-2 RNA is generally detectable in upper respiratory specimens during the acute phase of infection. The lowest concentration of SARS-CoV-2 viral copies this assay can detect is 138 copies/mL. A negative result does not preclude SARS-Cov-2 infection and should not be used as the sole basis for treatment or other patient management decisions. A negative result may occur with  improper specimen collection/handling, submission of specimen other than nasopharyngeal swab, presence of viral mutation(s) within the areas targeted by this assay, and inadequate number of viral copies(<138 copies/mL). A negative result must be combined with clinical observations, patient history, and epidemiological information. The expected result is Negative.  Fact Sheet for Patients:   EntrepreneurPulse.com.au  Fact Sheet for Healthcare Providers:  IncredibleEmployment.be  This test is no t yet approved or cleared by the [place] FDA and  has been authorized for detection and/or diagnosis of SARS-CoV-2 by FDA under an Emergency Use Authorization (EUA). This EUA will remain  in effect (meaning this test can be used) for the duration of the COVID-19 declaration under Section 564(b)(1) of the Act, 21 U.S.C.section 360bbb-3(b)(1), unless the authorization is terminated  or revoked sooner.       Influenza A by PCR POSITIVE (A) NEGATIVE Final   Influenza B by PCR NEGATIVE NEGATIVE Final    Comment: (NOTE) The Xpert Xpress SARS-CoV-2/FLU/RSV plus assay is intended as an aid in the diagnosis of influenza from Nasopharyngeal swab specimens and should not be used as a sole basis for treatment. Nasal washings and aspirates are unacceptable for Xpert Xpress SARS-CoV-2/FLU/RSV testing.  Fact Sheet for Patients: EntrepreneurPulse.com.au  Fact Sheet for Healthcare Providers: IncredibleEmployment.be  This test is not yet approved or cleared by the [place] FDA and has been authorized for detection and/or diagnosis of SARS-CoV-2 by FDA under an Emergency Use Authorization (EUA). This EUA will remain in effect (meaning this test can be used) for the duration of the COVID-19 declaration under Section 564(b)(1) of the Act, 21 U.S.C. section 360bbb-3(b)(1),  unless the authorization is terminated or revoked.     Resp Syncytial Virus by PCR NEGATIVE NEGATIVE Final    Comment: (NOTE) Fact Sheet for Patients: EntrepreneurPulse.com.au  Fact Sheet for Healthcare Providers: IncredibleEmployment.be  This test is not yet approved or cleared by the [place] FDA and has been authorized for detection and/or diagnosis of SARS-CoV-2 by FDA under an Emergency Use  Authorization (EUA). This EUA will remain in effect (meaning this test can be used) for the duration of the COVID-19 declaration under Section 564(b)(1) of the Act, 21 U.S.C. section 360bbb-3(b)(1), unless the authorization is terminated or revoked.  Performed at [facility], [place] [address], [place], [place] [address]   MRSA Next Gen by PCR, Nasal     Status: None   Collection Time: 06/01/22  8:03 PM   Specimen: Nasal Mucosa; Nasal Swab  Result Value Ref Range Status   MRSA by PCR Next Gen NOT DETECTED NOT DETECTED Final    Comment: (NOTE) The GeneXpert MRSA Assay (FDA approved for NASAL specimens only), is one component of a comprehensive MRSA colonization surveillance program. It is not intended to diagnose MRSA infection nor to guide or monitor treatment for MRSA infections. Test performance is not FDA approved in patients less than 47 years old. Performed at [facility], [place] [address] [place], [place] [address]          Radiology Studies: DG Chest 2 View  Result Date: 06/01/2022 CLINICAL DATA:  Shortness of breath EXAM: CHEST - 2 VIEW COMPARISON:  None Available. FINDINGS: Patchy peripheral left lung base opacity. No pleural effusion. Normal cardiomediastinal silhouette. No pneumothorax IMPRESSION: Patchy peripheral left lung base opacity, possible pneumonia. Radiographic follow-up to resolution is recommended. Electronically Signed   By: Donavan Foil M.D.   On: 06/01/2022 15:20        Scheduled Meds:  calcium carbonate  1 tablet Oral Daily   guaiFENesin  600 mg Oral BID   heparin  5,000 Units Subcutaneous Q8H   ipratropium-albuterol  3 mL Nebulization TID   morphine  60 mg Oral BID   multivitamin with minerals  1 tablet Oral Daily   nicotine  14 mg Transdermal Q24H   oseltamivir  75 mg Oral BID   pantoprazole  40 mg Oral Daily   Continuous Infusions:  sodium chloride 100 mL/hr at 06/02/22 1220   azithromycin      cefTRIAXone (ROCEPHIN)  IV       LOS: 1 day    Time spent: 35 minutes    Barb Merino, MD [facility] Pager [phone]

## 2022-06-02 NOTE — Plan of Care (Signed)
  Problem: Coping: Goal: Level of anxiety will decrease Outcome: Progressing   Problem: Pain Managment: Goal: General experience of comfort will improve Outcome: Progressing   

## 2022-06-02 NOTE — ED Notes (Signed)
Patient reported he was hurting bad and needed pain medication. Prn medication administered. Pain rated 7/10

## 2022-06-02 NOTE — Plan of Care (Signed)
  Problem: Education: Goal: Knowledge of General Education information will improve Description: Including pain rating scale, medication(s)/side effects and non-pharmacologic comfort measures Outcome: Progressing   Problem: Clinical Measurements: Goal: Respiratory complications will improve Outcome: Progressing   Problem: Clinical Measurements: Goal: Diagnostic test results will improve Outcome: Progressing

## 2022-06-02 NOTE — Consult Note (Signed)
Mr. Albus is well-known to me.  He is a 61 year old white male.  He has a long standing history of polycythemia vera.  He gets phlebotomies probably every 2 or 3 months.  He unfortunately was sent to the emergency room because of shortness of breath and cough.  He has not had a flu shot yet.  He did test positive for Influenza A.  He had a chest x-ray which showed some infiltrate in the left lower lobe.  He says that his son, and wife who live with him, have similar symptoms.  He has had a little bit of fever.  He is coughing up some purulent mucus.  He has been smoking.  Hopefully this will get him to stop smoking.  He has had no nausea or vomiting.  There is been no diarrhea.  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  He has had no rashes.  He has had no headache.  His labs today show white count 5.5.  Hemoglobin 13.  Hematocrit 42.8.  Platelet count 144,000.  His sodium is 138.  Potassium 4.1.  BUN 21 creatinine 1.4.  He has wheezes.  Thankfully, the polycythemia has never been a real problem for him.  Again he gets phlebotomized on a regular basis.  His iron levels have always been on the lower side.  His appetite has been little bit on the decrease.  Overall, I would say his performance status is probably ECOG 1.   His vital signs show temperature of 98.4.  Pulse 70.  Blood pressure 129/69.  Oxygen saturation is 93%.  His head and neck exam shows no adenopathy in the neck.  He has no oral lesions.  There is no conjunctival inflammation.  His lungs show wheezes bilaterally.  He has some rhonchi and crackles bilaterally.  Cardiac exam regular rate and rhythm.  He has no murmurs.  Abdomen is obese but soft.  Bowel sounds are present.  There is no guarding or rebound tenderness.  There is no fluid wave.  There is no palpable hepatosplenomegaly.  Extremity shows no clubbing, cyanosis or edema.  He has decent range of motion of his joints.  Neurological exam is  nonfocal.   Mr. Guster is a nice 61 year old white male.  He has a Influenza A.  He smokes.  Again, this will help to get him to stop smoking.  I do not think the polycythemia is a factor in this situation.  His hemoglobin is fine.  His white cells are normal.  I probably would just treat him as I would treat any patient with Influenza A with pneumonia.  I think he is on Tamiflu.  We will follow along try to help out in any way possible.  I know that he is already getting incredible care by everybody in the ER.  Lattie Haw, MD  Penelope Coop 6:10

## 2022-06-03 DIAGNOSIS — J101 Influenza due to other identified influenza virus with other respiratory manifestations: Secondary | ICD-10-CM | POA: Diagnosis not present

## 2022-06-03 DIAGNOSIS — N179 Acute kidney failure, unspecified: Secondary | ICD-10-CM | POA: Diagnosis not present

## 2022-06-03 MED ORDER — IPRATROPIUM-ALBUTEROL 0.5-2.5 (3) MG/3ML IN SOLN
3.0000 mL | Freq: Three times a day (TID) | RESPIRATORY_TRACT | Status: DC
  Administered 2022-06-03 – 2022-06-04 (×3): 3 mL via RESPIRATORY_TRACT
  Filled 2022-06-03 (×3): qty 3

## 2022-06-03 NOTE — Plan of Care (Signed)
  Problem: Coping: Goal: Level of anxiety will decrease Outcome: Progressing   Problem: Pain Managment: Goal: General experience of comfort will improve Outcome: Progressing   

## 2022-06-03 NOTE — Progress Notes (Signed)
Jacob Bradford seems to be feeling better.  He still having the oxygen.  He is still getting IV antibiotics.  He is on Tamiflu.  His appetite might be a little bit better.  He is still coughing up some mucus.  He says it seems to be loosening.  He is on nebulizers.  His vital signs are pretty stable.  Temperature 98 degrees.  Pulse 69.  Blood pressure 126/63.  His lungs sound better.  I still hear some wheezing bilaterally.  He may have a few rhonchi.  Cardiac exam regular rate and rhythm.  He has no murmurs.  Abdomen is soft but obese.  He has decent bowel sounds.  There is no fluid wave.  Extremity shows no clubbing, cyanosis or edema.  Neurological exam is nonfocal.  Jacob Bradford has polycythemia vera.  This is not a problem right now.  He has Influenza.  He has pneumonia.  He is on antibiotics.  He is on supplemental oxygen.  I am glad to see that he is feeling better.  I am glad that he is coughing up more mucus.  I know that he will get wonderful care from everybody upon [address].  I do appreciate everybody's help.   Lattie Haw, MD  Darlyn Chamber 29:11

## 2022-06-03 NOTE — Progress Notes (Signed)
PROGRESS NOTE    Jacob Bradford  CZY:[id number] DOB: December 24, 1960 DOA: 06/01/2022 PCP: Everardo Beals, NP    Brief Narrative:  61 year old with history of polycythemia vera, hypertension, anxiety presented to the emergency room from PCP office with cough and shortness of breath generalized body ache for about 4 days.  He had gone to PCP office to take a flu shot, however he was found with viral illness, saturation 82% on room air and was positive for influenza A.  In the emergency room admitted due to new oxygen requirement.  Chest x-ray showed left lower lobe pneumonia.   Assessment & Plan:   Influenza A infection Left lower lobe pneumonia Acute respiratory failure with hypoxemia:  Agree with admission given severity of symptoms. On Rocephin azithromycin.  Continue. Tamiflu day 3/5. Chest physiotherapy, incentive spirometry, deep breathing exercises, sputum induction, mucolytic's and bronchodilators. Sputum cultures, blood cultures, Legionella and streptococcal antigen. Supplemental oxygen to keep saturations more than 90%.  AKI: Improving.  Continue maintenance IV fluids. Polycythemia vera: Stable. Hypertension: Blood pressure stable. Smoker: Nicotine patch.  Counseling done.   DVT prophylaxis: heparin injection 5,000 Units Start: 06/01/22 2200   Code Status: Full code Family Communication: None at the bedside Disposition Plan: Status is: Inpatient Remains inpatient appropriate because: Pneumonia, new oxygen requirement     Consultants:  Oncology  Procedures:  None  Antimicrobials:  Rocephin azithromycin and Tamiflu 12/29----   Subjective: Patient was seen and examined.  Looks more comfortable and feels more comfortable.  Still on 4 to 5 L of oxygen.  Able to take deep breaths and do exercises.  Afebrile.  Objective: Vitals:   06/03/22 0536 06/03/22 0934 06/03/22 1311 06/03/22 1413  BP: 126/63  (!) 169/80   Pulse: 69  67   Resp: 18  19   Temp: 98 F  (36.7 C)  98.3 F (36.8 C)   TempSrc: Oral     SpO2: 100% 96% 100% 94%  Weight:      Height:        Intake/Output Summary (Last 24 hours) at 06/03/2022 1416 Last data filed at 06/03/2022 1000 Gross per 24 hour  Intake 3149.39 ml  Output 1700 ml  Net 1449.39 ml   Filed Weights   06/01/22 2051  Weight: 133.4 kg    Examination:  General exam: Appears calm and comfortable  Respiratory system: Clear to auscultation. Respiratory effort normal. SpO2: 94 % O2 Flow Rate (L/min): 4 L/min (down to 3 lpm post neb (goal  >=92%))  Cardiovascular system: S1 & S2 heard, RRR. No JVD, murmurs, rubs, gallops or clicks. No pedal edema. Gastrointestinal system: Soft.  Nontender.  Obese and pendulous. Central nervous system: Alert and oriented. No focal neurological deficits. Extremities: Symmetric 5 x 5 power. Skin: No rashes, lesions or ulcers Psychiatry: Judgement and insight appear normal. Mood & affect appropriate.     Data Reviewed: I have personally reviewed following labs and imaging studies  CBC: Recent Labs  Lab 06/01/22 1526 06/02/22 0356  WBC 5.6 5.5  NEUTROABS 3.8  --   HGB 13.7 13.0  HCT 43.9 42.8  MCV 87.3 89.4  PLT 160 093*   Basic Metabolic Panel: Recent Labs  Lab 06/01/22 1526 06/02/22 0356  NA 136 138  K 3.7 4.1  CL 95* 98  CO2 32 35*  GLUCOSE 126* 124*  BUN 22 21  CREATININE 1.78* 1.40*  CALCIUM 7.7* 7.2*   GFR: Estimated Creatinine Clearance: 77.2 mL/min (A) (by C-G formula based on SCr of  1.4 mg/dL (H)). Liver Function Tests: Recent Labs  Lab 06/01/22 1526  AST 43*  ALT 36  ALKPHOS 72  BILITOT 0.4  PROT 7.5  ALBUMIN 3.4*   No results for input(s): "LIPASE", "AMYLASE" in the last 168 hours. No results for input(s): "AMMONIA" in the last 168 hours. Coagulation Profile: No results for input(s): "INR", "PROTIME" in the last 168 hours. Cardiac Enzymes: No results for input(s): "CKTOTAL", "CKMB", "CKMBINDEX", "TROPONINI" in the last 168  hours. BNP (last 3 results) No results for input(s): "PROBNP" in the last 8760 hours. HbA1C: No results for input(s): "HGBA1C" in the last 72 hours. CBG: No results for input(s): "GLUCAP" in the last 168 hours. Lipid Profile: No results for input(s): "CHOL", "HDL", "LDLCALC", "TRIG", "CHOLHDL", "LDLDIRECT" in the last 72 hours. Thyroid Function Tests: No results for input(s): "TSH", "T4TOTAL", "FREET4", "T3FREE", "THYROIDAB" in the last 72 hours. Anemia Panel: No results for input(s): "VITAMINB12", "FOLATE", "FERRITIN", "TIBC", "IRON", "RETICCTPCT" in the last 72 hours. Sepsis Labs: No results for input(s): "PROCALCITON", "LATICACIDVEN" in the last 168 hours.  Recent Results (from the past 240 hour(s))  Resp panel by RT-PCR (RSV, Flu A&B, Covid) Anterior Nasal Swab     Status: Abnormal   Collection Time: 06/01/22  2:34 PM   Specimen: Anterior Nasal Swab  Result Value Ref Range Status   SARS Coronavirus 2 by RT PCR NEGATIVE NEGATIVE Final    Comment: (NOTE) SARS-CoV-2 target nucleic acids are NOT DETECTED.  The SARS-CoV-2 RNA is generally detectable in upper respiratory specimens during the acute phase of infection. The lowest concentration of SARS-CoV-2 viral copies this assay can detect is 138 copies/mL. A negative result does not preclude SARS-Cov-2 infection and should not be used as the sole basis for treatment or other patient management decisions. A negative result may occur with  improper specimen collection/handling, submission of specimen other than nasopharyngeal swab, presence of viral mutation(s) within the areas targeted by this assay, and inadequate number of viral copies(<138 copies/mL). A negative result must be combined with clinical observations, patient history, and epidemiological information. The expected result is Negative.  Fact Sheet for Patients:  EntrepreneurPulse.com.au  Fact Sheet for Healthcare Providers:   IncredibleEmployment.be  This test is no t yet approved or cleared by the [place] FDA and  has been authorized for detection and/or diagnosis of SARS-CoV-2 by FDA under an Emergency Use Authorization (EUA). This EUA will remain  in effect (meaning this test can be used) for the duration of the COVID-19 declaration under Section 564(b)(1) of the Act, 21 U.S.C.section 360bbb-3(b)(1), unless the authorization is terminated  or revoked sooner.       Influenza A by PCR POSITIVE (A) NEGATIVE Final   Influenza B by PCR NEGATIVE NEGATIVE Final    Comment: (NOTE) The Xpert Xpress SARS-CoV-2/FLU/RSV plus assay is intended as an aid in the diagnosis of influenza from Nasopharyngeal swab specimens and should not be used as a sole basis for treatment. Nasal washings and aspirates are unacceptable for Xpert Xpress SARS-CoV-2/FLU/RSV testing.  Fact Sheet for Patients: EntrepreneurPulse.com.au  Fact Sheet for Healthcare Providers: IncredibleEmployment.be  This test is not yet approved or cleared by the [place] FDA and has been authorized for detection and/or diagnosis of SARS-CoV-2 by FDA under an Emergency Use Authorization (EUA). This EUA will remain in effect (meaning this test can be used) for the duration of the COVID-19 declaration under Section 564(b)(1) of the Act, 21 U.S.C. section 360bbb-3(b)(1), unless the authorization is terminated or revoked.  Resp Syncytial Virus by PCR NEGATIVE NEGATIVE Final    Comment: (NOTE) Fact Sheet for Patients: EntrepreneurPulse.com.au  Fact Sheet for Healthcare Providers: IncredibleEmployment.be  This test is not yet approved or cleared by the [place] FDA and has been authorized for detection and/or diagnosis of SARS-CoV-2 by FDA under an Emergency Use Authorization (EUA). This EUA will remain in effect (meaning this test can be used)  for the duration of the COVID-19 declaration under Section 564(b)(1) of the Act, 21 U.S.C. section 360bbb-3(b)(1), unless the authorization is terminated or revoked.  Performed at [facility], [place] [address], [place], [place] [address]   MRSA Next Gen by PCR, Nasal     Status: None   Collection Time: 06/01/22  8:03 PM   Specimen: Nasal Mucosa; Nasal Swab  Result Value Ref Range Status   MRSA by PCR Next Gen NOT DETECTED NOT DETECTED Final    Comment: (NOTE) The GeneXpert MRSA Assay (FDA approved for NASAL specimens only), is one component of a comprehensive MRSA colonization surveillance program. It is not intended to diagnose MRSA infection nor to guide or monitor treatment for MRSA infections. Test performance is not FDA approved in patients less than 52 years old. Performed at [facility], [place] [address] [place], [place] [address]          Radiology Studies: DG Chest 2 View  Result Date: 06/01/2022 CLINICAL DATA:  Shortness of breath EXAM: CHEST - 2 VIEW COMPARISON:  None Available. FINDINGS: Patchy peripheral left lung base opacity. No pleural effusion. Normal cardiomediastinal silhouette. No pneumothorax IMPRESSION: Patchy peripheral left lung base opacity, possible pneumonia. Radiographic follow-up to resolution is recommended. Electronically Signed   By: Donavan Foil M.D.   On: 06/01/2022 15:20        Scheduled Meds:  calcium carbonate  1 tablet Oral Daily   guaiFENesin  600 mg Oral BID   heparin  5,000 Units Subcutaneous Q8H   ipratropium-albuterol  3 mL Nebulization TID   morphine  60 mg Oral BID   multivitamin with minerals  1 tablet Oral Daily   nicotine  14 mg Transdermal Q24H   oseltamivir  75 mg Oral BID   pantoprazole  40 mg Oral Daily   Continuous Infusions:  sodium chloride 100 mL/hr at 06/02/22 1725   azithromycin 500 mg (06/02/22 1840)   cefTRIAXone (ROCEPHIN)  IV 2 g (06/02/22 1728)     LOS: 2 days     Time spent: 35 minutes    Barb Merino, MD [facility] Pager [phone]

## 2022-06-03 NOTE — Plan of Care (Signed)
  Problem: Education: Goal: Knowledge of General Education information will improve Description: Including pain rating scale, medication(s)/side effects and non-pharmacologic comfort measures Outcome: Progressing   Problem: Activity: Goal: Risk for activity intolerance will decrease Outcome: Progressing   Problem: Pain Managment: Goal: General experience of comfort will improve Outcome: Progressing   

## 2022-06-04 DIAGNOSIS — J101 Influenza due to other identified influenza virus with other respiratory manifestations: Secondary | ICD-10-CM | POA: Diagnosis not present

## 2022-06-04 DIAGNOSIS — J189 Pneumonia, unspecified organism: Secondary | ICD-10-CM | POA: Diagnosis not present

## 2022-06-04 DIAGNOSIS — J9601 Acute respiratory failure with hypoxia: Secondary | ICD-10-CM

## 2022-06-04 LAB — BASIC METABOLIC PANEL
Anion gap: 9 (ref 5–15)
BUN: 10 mg/dL (ref 8–23)
CO2: 28 mmol/L (ref 22–32)
Calcium: 7.4 mg/dL — ABNORMAL LOW (ref 8.9–10.3)
Chloride: 102 mmol/L (ref 98–111)
Creatinine, Ser: 0.92 mg/dL (ref 0.61–1.24)
GFR, Estimated: >60 mL/min (ref >60)
Glucose, Bld: 84 mg/dL (ref 70–99)
Potassium: 3.8 mmol/L (ref 3.5–5.1)
Sodium: 139 mmol/L (ref 135–145)

## 2022-06-04 LAB — CBC WITH DIFFERENTIAL/PLATELET
Abs Immature Granulocytes: 0.02 10*3/uL (ref 0.00–0.07)
Basophils Absolute: 0 10*3/uL (ref 0.0–0.1)
Basophils Relative: 1 %
Eosinophils Absolute: 0.1 10*3/uL (ref 0.0–0.5)
Eosinophils Relative: 1 %
HCT: 40.1 % (ref 39.0–52.0)
Hemoglobin: 12.2 g/dL — ABNORMAL LOW (ref 13.0–17.0)
Immature Granulocytes: 0 %
Lymphocytes Relative: 35 %
Lymphs Abs: 1.7 10*3/uL (ref 0.7–4.0)
MCH: 27.2 pg (ref 26.0–34.0)
MCHC: 30.4 g/dL (ref 30.0–36.0)
MCV: 89.3 fL (ref 80.0–100.0)
Monocytes Absolute: 0.4 10*3/uL (ref 0.1–1.0)
Monocytes Relative: 9 %
Neutro Abs: 2.6 10*3/uL (ref 1.7–7.7)
Neutrophils Relative %: 54 %
Platelets: 156 10*3/uL (ref 150–400)
RBC: 4.49 MIL/uL (ref 4.22–5.81)
RDW: 13.9 % (ref 11.5–15.5)
WBC: 4.8 10*3/uL (ref 4.0–10.5)
nRBC: 0 % (ref 0.0–0.2)

## 2022-06-04 MED ORDER — ALBUTEROL SULFATE HFA 108 (90 BASE) MCG/ACT IN AERS: 2.0000 | INHALATION_SPRAY | Freq: Four times a day (QID) | RESPIRATORY_TRACT | 2 refills | Status: DC | PRN

## 2022-06-04 MED ORDER — AMOXICILLIN-POT CLAVULANATE 875-125 MG PO TABS: 1.0000 | ORAL_TABLET | Freq: Two times a day (BID) | ORAL | 0 refills | Status: AC

## 2022-06-04 MED ORDER — AMOXICILLIN-POT CLAVULANATE 875-125 MG PO TABS: 1.0000 | ORAL_TABLET | Freq: Two times a day (BID) | ORAL | 0 refills | Status: DC

## 2022-06-04 MED ORDER — NICOTINE 14 MG/24HR TD PT24: 14.0000 mg | MEDICATED_PATCH | TRANSDERMAL | 0 refills | Status: DC

## 2022-06-04 MED ORDER — OSELTAMIVIR PHOSPHATE 75 MG PO CAPS: 75.0000 mg | ORAL_CAPSULE | Freq: Two times a day (BID) | ORAL | 0 refills | Status: DC

## 2022-06-04 MED ORDER — OSELTAMIVIR PHOSPHATE 75 MG PO CAPS: 75.0000 mg | ORAL_CAPSULE | Freq: Two times a day (BID) | ORAL | 0 refills | Status: AC

## 2022-06-04 NOTE — Progress Notes (Signed)
Patient's o2 sats overnight were 97-100% on 3 liters HFNC, no shortness of breath noted, he is using the incentive spirometer efficiently, will turn oxygen down to 1 liter now and have dayshift reevaluate oxygen saturation this morning. Can probably switch over to regular nasal cannula now since oxygen requirements have stayed below 6 liters for the past 24 hours.

## 2022-06-04 NOTE — Progress Notes (Signed)
Nurse reviewed discharge instructions with pt. Pt verbalized understanding of discharge instructions, follow up appointment and new medications.  No concerns at time of discharge.

## 2022-06-04 NOTE — Discharge Summary (Signed)
Physician Discharge Summary  Jacob Bradford BSW:[id number] DOB: 13-Jun-1960 DOA: 06/01/2022  PCP: Everardo Beals, NP  Admit date: 06/01/2022 Discharge date: 06/04/2022  Admitted From: Home Disposition: Home  Recommendations for Outpatient Follow-up:  Follow up with PCP in 1-2 weeks   Home Health: N/A Equipment/Devices: N/A  Discharge Condition: Stable CODE STATUS: Full code Diet recommendation: Low-salt diet  Discharge summary: 62 year old with history of polycythemia vera, ongoing smoker, hypertension and anxiety presents to the ER from PCP office where he presented with cough and shortness of breath generalized body ache for 4 days.  He had gone to PCP office for flu shot however he was found with viral illnesses, saturation 82% on room air and was positive for influenza A.  In the emergency room he was on 10 L of oxygen.  Chest x-ray showed left lower lobe infiltrates.  Influenza A infection Left lower lobe pneumonia Acute respite failure with hypoxemia Smoker and untreated sleep apnea  Patient was admitted to the hospital and treated with aggressive bronchodilator therapy and chest physiotherapy with improvement.  He also received Rocephin azithromycin and Tamiflu for 3 days.  Blood cultures are negative.  Clinically improved.  Today on room air and mobilizing around without hypoxemia. Plan: Tamiflu for 2 additional days Augmentin for 2 additional days Over-the-counter cough medications He was also prescribed albuterol inhaler as needed for cough, wheezing. Smoking cessation is strongly counseled and recommended, nicotine patch provided Continue to do chest physiotherapy and incentive spirometry at home. Renal functions normalized. Continue all home medication regimen including pain management. Stable for discharge. Recommended reevaluation for sleep apnea.   Discharge Diagnoses:  Principal Problem:   Community acquired pneumonia Active Problems:   Influenza A    AKI (acute kidney injury) [facility])    Discharge Instructions  Discharge Instructions     Diet - low sodium heart healthy   Complete by: As directed    Increase activity slowly   Complete by: As directed       Allergies as of 06/04/2022   No Known Allergies      Medication List     STOP taking these medications    omeprazole 20 MG capsule Commonly known as: PRILOSEC       TAKE these medications    acetaminophen 500 MG tablet Commonly known as: TYLENOL Take 1,000 mg by mouth every 6 (six) hours as needed for moderate pain.   albuterol 108 (90 Base) MCG/ACT inhaler Commonly known as: VENTOLIN HFA Inhale 2 puffs into the lungs every 6 (six) hours as needed for wheezing or shortness of breath.   ALPRAZolam 1 MG tablet Commonly known as: XANAX Take 1 tablet (1 mg total) by mouth 3 (three) times daily as needed for anxiety. What changed:  how much to take when to take this   amoxicillin-clavulanate 875-125 MG tablet Commonly known as: AUGMENTIN Take 1 tablet by mouth 2 (two) times daily for 2 days.   aspirin 81 MG tablet Take 1 tablet (81 mg total) by mouth daily.   atorvastatin 20 MG tablet Commonly known as: LIPITOR Take 20 mg by mouth daily.   calcium carbonate 500 MG chewable tablet Commonly known as: TUMS - dosed in mg elemental calcium Chew 1 tablet by mouth 2 (two) times daily as needed for indigestion.   esomeprazole 40 MG capsule Commonly known as: NEXIUM Take 40 mg by mouth daily at 12 noon.   furosemide 20 MG tablet Commonly known as: LASIX Take 20 mg by mouth daily as  needed for edema or fluid.   gemfibrozil 600 MG tablet Commonly known as: LOPID Take 600 mg by mouth 2 (two) times daily.   Goodys Extra Strength [id number] MG Pack Generic drug: Aspirin-Acetaminophen-Caffeine Take 1 packet by mouth 3 (three) times daily as needed (pain).   losartan-hydrochlorothiazide 100-12.5 MG tablet Commonly known as: HYZAAR Take 1 tablet by mouth  daily.   morphine 30 MG tablet Commonly known as: MSIR Take 1-2 every 6 hrs as needed for pain What changed:  how much to take how to take this when to take this reasons to take this   morphine 60 MG 12 hr tablet Commonly known as: MS CONTIN Take 1 tablet (60 mg total) by mouth 2 (two) times daily. What changed: Another medication with the same name was changed. Make sure you understand how and when to take each.   nicotine 14 mg/24hr patch Commonly known as: NICODERM CQ - dosed in mg/24 hours Place 1 patch (14 mg total) onto the skin daily.   oseltamivir 75 MG capsule Commonly known as: TAMIFLU Take 1 capsule (75 mg total) by mouth 2 (two) times daily for 2 days.   polyethylene glycol 17 g packet Commonly known as: MIRALAX / GLYCOLAX Take 17 g by mouth daily as needed for moderate constipation.        No Known Allergies  Consultations: Hematology oncology   Procedures/Studies: DG Chest 2 View  Result Date: 06/01/2022 CLINICAL DATA:  Shortness of breath EXAM: CHEST - 2 VIEW COMPARISON:  None Available. FINDINGS: Patchy peripheral left lung base opacity. No pleural effusion. Normal cardiomediastinal silhouette. No pneumothorax IMPRESSION: Patchy peripheral left lung base opacity, possible pneumonia. Radiographic follow-up to resolution is recommended. Electronically Signed   By: Donavan Foil M.D.   On: 06/01/2022 15:20   (Echo, Carotid, EGD, Colonoscopy, ERCP)    Subjective: Patient seen and examined.  No overnight events.  Occasional dry cough but denies any chest pain or shortness of breath.  He is mobilizing around and was using 1 L oxygen overnight and currently not using any oxygen.   Discharge Exam: Vitals:   06/04/22 0421 06/04/22 0935  BP: (!) 150/83   Pulse: 68   Resp: 18   Temp: 99.3 F (37.4 C)   SpO2: 97% 90%   Vitals:   06/03/22 1803 06/03/22 2205 06/04/22 0421 06/04/22 0935  BP:  (!) 143/95 (!) 150/83   Pulse:  68 68   Resp:  16 18   Temp:   98.3 F (36.8 C) 99.3 F (37.4 C)   TempSrc:  Oral    SpO2: 94% 100% 97% 90%  Weight:      Height:        General: Pt is alert, awake, not in acute distress Cardiovascular: RRR, S1/S2 +, no rubs, no gallops Respiratory: CTA bilaterally, no wheezing, no rhonchi, no added sounds Abdominal: Soft, NT, ND, bowel sounds +, obese and pendulous Extremities: no edema, no cyanosis    The results of significant diagnostics from this hospitalization (including imaging, microbiology, ancillary and laboratory) are listed below for reference.     Microbiology: Recent Results (from the past 240 hour(s))  Resp panel by RT-PCR (RSV, Flu A&B, Covid) Anterior Nasal Swab     Status: Abnormal   Collection Time: 06/01/22  2:34 PM   Specimen: Anterior Nasal Swab  Result Value Ref Range Status   SARS Coronavirus 2 by RT PCR NEGATIVE NEGATIVE Final    Comment: (NOTE) SARS-CoV-2 target nucleic acids are NOT  DETECTED.  The SARS-CoV-2 RNA is generally detectable in upper respiratory specimens during the acute phase of infection. The lowest concentration of SARS-CoV-2 viral copies this assay can detect is 138 copies/mL. A negative result does not preclude SARS-Cov-2 infection and should not be used as the sole basis for treatment or other patient management decisions. A negative result may occur with  improper specimen collection/handling, submission of specimen other than nasopharyngeal swab, presence of viral mutation(s) within the areas targeted by this assay, and inadequate number of viral copies(<138 copies/mL). A negative result must be combined with clinical observations, patient history, and epidemiological information. The expected result is Negative.  Fact Sheet for Patients:  EntrepreneurPulse.com.au  Fact Sheet for Healthcare Providers:  IncredibleEmployment.be  This test is no t yet approved or cleared by the [place] FDA and  has been  authorized for detection and/or diagnosis of SARS-CoV-2 by FDA under an Emergency Use Authorization (EUA). This EUA will remain  in effect (meaning this test can be used) for the duration of the COVID-19 declaration under Section 564(b)(1) of the Act, 21 U.S.C.section 360bbb-3(b)(1), unless the authorization is terminated  or revoked sooner.       Influenza A by PCR POSITIVE (A) NEGATIVE Final   Influenza B by PCR NEGATIVE NEGATIVE Final    Comment: (NOTE) The Xpert Xpress SARS-CoV-2/FLU/RSV plus assay is intended as an aid in the diagnosis of influenza from Nasopharyngeal swab specimens and should not be used as a sole basis for treatment. Nasal washings and aspirates are unacceptable for Xpert Xpress SARS-CoV-2/FLU/RSV testing.  Fact Sheet for Patients: EntrepreneurPulse.com.au  Fact Sheet for Healthcare Providers: IncredibleEmployment.be  This test is not yet approved or cleared by the [place] FDA and has been authorized for detection and/or diagnosis of SARS-CoV-2 by FDA under an Emergency Use Authorization (EUA). This EUA will remain in effect (meaning this test can be used) for the duration of the COVID-19 declaration under Section 564(b)(1) of the Act, 21 U.S.C. section 360bbb-3(b)(1), unless the authorization is terminated or revoked.     Resp Syncytial Virus by PCR NEGATIVE NEGATIVE Final    Comment: (NOTE) Fact Sheet for Patients: EntrepreneurPulse.com.au  Fact Sheet for Healthcare Providers: IncredibleEmployment.be  This test is not yet approved or cleared by the [place] FDA and has been authorized for detection and/or diagnosis of SARS-CoV-2 by FDA under an Emergency Use Authorization (EUA). This EUA will remain in effect (meaning this test can be used) for the duration of the COVID-19 declaration under Section 564(b)(1) of the Act, 21 U.S.C. section 360bbb-3(b)(1), unless the  authorization is terminated or revoked.  Performed at [facility], [place] [address], [place], [place] [address]   MRSA Next Gen by PCR, Nasal     Status: None   Collection Time: 06/01/22  8:03 PM   Specimen: Nasal Mucosa; Nasal Swab  Result Value Ref Range Status   MRSA by PCR Next Gen NOT DETECTED NOT DETECTED Final    Comment: (NOTE) The GeneXpert MRSA Assay (FDA approved for NASAL specimens only), is one component of a comprehensive MRSA colonization surveillance program. It is not intended to diagnose MRSA infection nor to guide or monitor treatment for MRSA infections. Test performance is not FDA approved in patients less than 14 years old. Performed at [facility], [place] [address] [place], [place] [address]      Labs: BNP (last 3 results) Recent Labs    06/01/22 1526  BNP 944.9*   Basic Metabolic  Panel: Recent Labs  Lab 06/01/22 1526 06/02/22 0356 06/04/22 0329  NA 136 138 139  K 3.7 4.1 3.8  CL 95* 98 102  CO2 32 35* 28  GLUCOSE 126* 124* 84  BUN '22 21 10  '$ CREATININE 1.78* 1.40* 0.92  CALCIUM 7.7* 7.2* 7.4*   Liver Function Tests: Recent Labs  Lab 06/01/22 1526  AST 43*  ALT 36  ALKPHOS 72  BILITOT 0.4  PROT 7.5  ALBUMIN 3.4*   No results for input(s): "LIPASE", "AMYLASE" in the last 168 hours. No results for input(s): "AMMONIA" in the last 168 hours. CBC: Recent Labs  Lab 06/01/22 1526 06/02/22 0356 06/04/22 0329  WBC 5.6 5.5 4.8  NEUTROABS 3.8  --  2.6  HGB 13.7 13.0 12.2*  HCT 43.9 42.8 40.1  MCV 87.3 89.4 89.3  PLT 160 144* 156   Cardiac Enzymes: No results for input(s): "CKTOTAL", "CKMB", "CKMBINDEX", "TROPONINI" in the last 168 hours. BNP: Invalid input(s): "POCBNP" CBG: No results for input(s): "GLUCAP" in the last 168 hours. D-Dimer No results for input(s): "DDIMER" in the last 72 hours. Hgb A1c No results for input(s): "HGBA1C" in the last 72 hours. Lipid Profile No results for  input(s): "CHOL", "HDL", "LDLCALC", "TRIG", "CHOLHDL", "LDLDIRECT" in the last 72 hours. Thyroid function studies No results for input(s): "TSH", "T4TOTAL", "T3FREE", "THYROIDAB" in the last 72 hours.  Invalid input(s): "FREET3" Anemia work up No results for input(s): "VITAMINB12", "FOLATE", "FERRITIN", "TIBC", "IRON", "RETICCTPCT" in the last 72 hours. Urinalysis No results found for: "COLORURINE", "APPEARANCEUR", "LABSPEC", "PHURINE", "GLUCOSEU", "HGBUR", "BILIRUBINUR", "KETONESUR", "PROTEINUR", "UROBILINOGEN", "NITRITE", "LEUKOCYTESUR" Sepsis Labs Recent Labs  Lab 06/01/22 1526 06/02/22 0356 06/04/22 0329  WBC 5.6 5.5 4.8   Microbiology Recent Results (from the past 240 hour(s))  Resp panel by RT-PCR (RSV, Flu A&B, Covid) Anterior Nasal Swab     Status: Abnormal   Collection Time: 06/01/22  2:34 PM   Specimen: Anterior Nasal Swab  Result Value Ref Range Status   SARS Coronavirus 2 by RT PCR NEGATIVE NEGATIVE Final    Comment: (NOTE) SARS-CoV-2 target nucleic acids are NOT DETECTED.  The SARS-CoV-2 RNA is generally detectable in upper respiratory specimens during the acute phase of infection. The lowest concentration of SARS-CoV-2 viral copies this assay can detect is 138 copies/mL. A negative result does not preclude SARS-Cov-2 infection and should not be used as the sole basis for treatment or other patient management decisions. A negative result may occur with  improper specimen collection/handling, submission of specimen other than nasopharyngeal swab, presence of viral mutation(s) within the areas targeted by this assay, and inadequate number of viral copies(<138 copies/mL). A negative result must be combined with clinical observations, patient history, and epidemiological information. The expected result is Negative.  Fact Sheet for Patients:  EntrepreneurPulse.com.au  Fact Sheet for Healthcare Providers:   IncredibleEmployment.be  This test is no t yet approved or cleared by the [place] FDA and  has been authorized for detection and/or diagnosis of SARS-CoV-2 by FDA under an Emergency Use Authorization (EUA). This EUA will remain  in effect (meaning this test can be used) for the duration of the COVID-19 declaration under Section 564(b)(1) of the Act, 21 U.S.C.section 360bbb-3(b)(1), unless the authorization is terminated  or revoked sooner.       Influenza A by PCR POSITIVE (A) NEGATIVE Final   Influenza B by PCR NEGATIVE NEGATIVE Final    Comment: (NOTE) The Xpert Xpress SARS-CoV-2/FLU/RSV plus assay is intended as an aid in the  diagnosis of influenza from Nasopharyngeal swab specimens and should not be used as a sole basis for treatment. Nasal washings and aspirates are unacceptable for Xpert Xpress SARS-CoV-2/FLU/RSV testing.  Fact Sheet for Patients: EntrepreneurPulse.com.au  Fact Sheet for Healthcare Providers: IncredibleEmployment.be  This test is not yet approved or cleared by the [place] FDA and has been authorized for detection and/or diagnosis of SARS-CoV-2 by FDA under an Emergency Use Authorization (EUA). This EUA will remain in effect (meaning this test can be used) for the duration of the COVID-19 declaration under Section 564(b)(1) of the Act, 21 U.S.C. section 360bbb-3(b)(1), unless the authorization is terminated or revoked.     Resp Syncytial Virus by PCR NEGATIVE NEGATIVE Final    Comment: (NOTE) Fact Sheet for Patients: EntrepreneurPulse.com.au  Fact Sheet for Healthcare Providers: IncredibleEmployment.be  This test is not yet approved or cleared by the [place] FDA and has been authorized for detection and/or diagnosis of SARS-CoV-2 by FDA under an Emergency Use Authorization (EUA). This EUA will remain in effect (meaning this test can be used)  for the duration of the COVID-19 declaration under Section 564(b)(1) of the Act, 21 U.S.C. section 360bbb-3(b)(1), unless the authorization is terminated or revoked.  Performed at [facility], [place] [address], [place], [place] [address]   MRSA Next Gen by PCR, Nasal     Status: None   Collection Time: 06/01/22  8:03 PM   Specimen: Nasal Mucosa; Nasal Swab  Result Value Ref Range Status   MRSA by PCR Next Gen NOT DETECTED NOT DETECTED Final    Comment: (NOTE) The GeneXpert MRSA Assay (FDA approved for NASAL specimens only), is one component of a comprehensive MRSA colonization surveillance program. It is not intended to diagnose MRSA infection nor to guide or monitor treatment for MRSA infections. Test performance is not FDA approved in patients less than 23 years old. Performed at [facility], [place] [address] [place], [place] [address]      Time coordinating discharge: 35 minutes  SIGNED:   Barb Merino, MD  [facility] 06/04/2022, 11:33 AM

## 2022-06-04 NOTE — Progress Notes (Signed)
Overall, Jacob Bradford is getting better.  He looks better.  Oxygen levels are doing okay.  His oxygen requirements are coming down.  He has had no fever.  He is out of bed a little bit.  He is eating okay.  He has had no nausea or vomiting.  His labs show a white count of 4.8.  Hemoglobin 12.2.  Platelet count 156,000.  His sodium is 139.  Potassium 3.8.  BUN 10 creatinine 0.92.  Blood sugar is 84.  He has had no bleeding.  He has had no problems with pain.  He has had no constipation.  He is urinating well.  His vital signs show temperature of 99.3.  Pulse 68.  Blood pressure 150/83.  Oxygen saturation is 97%.  His lungs sound better.  There are still some wheezing.  However, he has better air movement.  Cardiac exam regular rate and rhythm.  Abdomen is soft but obese.  He has decent bowel sounds.  Extremity shows no clubbing, cyanosis or edema.  Jacob Bradford has Influenza.  He is on IV antibiotics.  He continues on Tamiflu.  Again he is improving.  Hopefully, he will be able to go home soon.  His polycythemia is not a factor right now.  His hemoglobin looks great.  I do appreciate the great care that he is getting from everybody up on [place], MD  2 Cor 4:8

## 2022-06-06 LAB — LEGIONELLA PNEUMOPHILA SEROGP 1 UR AG: L. pneumophila Serogp 1 Ur Ag: NEGATIVE

## 2022-06-07 ENCOUNTER — Ambulatory Visit: Payer: Medicare HMO | Admitting: Hematology & Oncology

## 2022-06-07 ENCOUNTER — Inpatient Hospital Stay: Payer: Medicare HMO

## 2022-06-21 ENCOUNTER — Encounter: Payer: Self-pay | Admitting: Hematology & Oncology

## 2022-06-22 ENCOUNTER — Inpatient Hospital Stay: Payer: Medicare HMO

## 2022-06-22 ENCOUNTER — Encounter: Payer: Self-pay | Admitting: Hematology & Oncology

## 2022-06-22 ENCOUNTER — Inpatient Hospital Stay (HOSPITAL_BASED_OUTPATIENT_CLINIC_OR_DEPARTMENT_OTHER): Payer: Medicare HMO | Admitting: Hematology & Oncology

## 2022-06-22 ENCOUNTER — Inpatient Hospital Stay: Payer: Medicare HMO | Attending: Hematology & Oncology

## 2022-06-22 DIAGNOSIS — D45 Polycythemia vera: Secondary | ICD-10-CM | POA: Insufficient documentation

## 2022-06-22 LAB — CMP (CANCER CENTER ONLY)
ALT: 20 U/L (ref 0–44)
AST: 14 U/L — ABNORMAL LOW (ref 15–41)
Albumin: 4.2 g/dL (ref 3.5–5.0)
Alkaline Phosphatase: 86 U/L (ref 38–126)
Anion gap: 9 (ref 5–15)
BUN: 16 mg/dL (ref 8–23)
CO2: 26 mmol/L (ref 22–32)
Calcium: 9.4 mg/dL (ref 8.9–10.3)
Chloride: 103 mmol/L (ref 98–111)
Creatinine: 1.23 mg/dL (ref 0.61–1.24)
GFR, Estimated: >60 mL/min (ref >60)
Glucose, Bld: 150 mg/dL — ABNORMAL HIGH (ref 70–99)
Potassium: 3.8 mmol/L (ref 3.5–5.1)
Sodium: 138 mmol/L (ref 135–145)
Total Bilirubin: 0.7 mg/dL (ref 0.3–1.2)
Total Protein: 7.6 g/dL (ref 6.5–8.1)

## 2022-06-22 LAB — CBC WITH DIFFERENTIAL (CANCER CENTER ONLY)
Abs Immature Granulocytes: 0.07 10*3/uL (ref 0.00–0.07)
Basophils Absolute: 0.1 10*3/uL (ref 0.0–0.1)
Basophils Relative: 1 %
Eosinophils Absolute: 0.2 10*3/uL (ref 0.0–0.5)
Eosinophils Relative: 2 %
HCT: 48.4 % (ref 39.0–52.0)
Hemoglobin: 15.5 g/dL (ref 13.0–17.0)
Immature Granulocytes: 1 %
Lymphocytes Relative: 17 %
Lymphs Abs: 2 10*3/uL (ref 0.7–4.0)
MCH: 27 pg (ref 26.0–34.0)
MCHC: 32 g/dL (ref 30.0–36.0)
MCV: 84.2 fL (ref 80.0–100.0)
Monocytes Absolute: 1 10*3/uL (ref 0.1–1.0)
Monocytes Relative: 9 %
Neutro Abs: 8.2 10*3/uL — ABNORMAL HIGH (ref 1.7–7.7)
Neutrophils Relative %: 70 %
Platelet Count: 265 10*3/uL (ref 150–400)
RBC: 5.75 MIL/uL (ref 4.22–5.81)
RDW: 14 % (ref 11.5–15.5)
WBC Count: 11.5 10*3/uL — ABNORMAL HIGH (ref 4.0–10.5)
nRBC: 0 % (ref 0.0–0.2)

## 2022-06-22 LAB — IRON AND IRON BINDING CAPACITY (CC-WL,HP ONLY)
Iron: 85 ug/dL (ref 45–182)
Saturation Ratios: 17 % — ABNORMAL LOW (ref 17.9–39.5)
TIBC: 514 ug/dL — ABNORMAL HIGH (ref 250–450)
UIBC: 429 ug/dL — ABNORMAL HIGH (ref 117–376)

## 2022-06-22 LAB — FERRITIN: Ferritin: 12 ng/mL — ABNORMAL LOW (ref 24–336)

## 2022-06-22 MED ORDER — MORPHINE SULFATE 30 MG PO TABS: ORAL_TABLET | ORAL | 0 refills | Status: DC

## 2022-06-22 MED ORDER — MORPHINE SULFATE ER 60 MG PO TBCR: 60.0000 mg | EXTENDED_RELEASE_TABLET | Freq: Two times a day (BID) | ORAL | 0 refills | Status: DC

## 2022-06-22 NOTE — Progress Notes (Signed)
Jacob Bradford presents today for phlebotomy per MD orders. Phlebotomy procedure started at 1420 and ended at 1440. 550 cc removed. Patient tolerated procedure well. IV needle removed intact.
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
## 2022-06-22 NOTE — Patient Instructions (Signed)

## 2022-06-24 ENCOUNTER — Encounter: Payer: Self-pay | Admitting: Hematology & Oncology

## 2022-06-25 NOTE — Telephone Encounter (Signed)
I have spoken to the patient and the insurance card we have on file is from 2022 and is not active.  I called him and advised that he needs to contact his insurance and call me back with the information so I can get it processed.  Thanks

## 2022-06-27 ENCOUNTER — Encounter: Payer: Self-pay | Admitting: *Deleted

## 2022-06-27 NOTE — Progress Notes (Signed)
Approval received from plan for Morphine Sulfate IR 30 mg tabs #180/30 days from 06/25/22-06/04/23.  Case ID [id number]

## 2022-07-19 ENCOUNTER — Ambulatory Visit: Payer: Medicare HMO | Admitting: Adult Health

## 2022-07-19 ENCOUNTER — Encounter: Payer: Self-pay | Admitting: Adult Health

## 2022-07-19 ENCOUNTER — Ambulatory Visit (INDEPENDENT_AMBULATORY_CARE_PROVIDER_SITE_OTHER): Payer: Medicare HMO

## 2022-07-19 VITALS — BP 130/70 | HR 96 | Temp 97.8°F | Ht 71.0 in | Wt 283.6 lb

## 2022-07-19 DIAGNOSIS — J181 Lobar pneumonia, unspecified organism: Secondary | ICD-10-CM | POA: Diagnosis not present

## 2022-07-19 DIAGNOSIS — Z9981 Dependence on supplemental oxygen: Secondary | ICD-10-CM

## 2022-07-19 DIAGNOSIS — F17200 Nicotine dependence, unspecified, uncomplicated: Secondary | ICD-10-CM

## 2022-07-19 DIAGNOSIS — J9611 Chronic respiratory failure with hypoxia: Secondary | ICD-10-CM

## 2022-07-19 DIAGNOSIS — J189 Pneumonia, unspecified organism: Secondary | ICD-10-CM

## 2022-07-19 DIAGNOSIS — R0683 Snoring: Secondary | ICD-10-CM

## 2022-07-19 DIAGNOSIS — Z720 Tobacco use: Secondary | ICD-10-CM

## 2022-07-19 DIAGNOSIS — Z23 Encounter for immunization: Secondary | ICD-10-CM

## 2022-07-19 NOTE — Assessment & Plan Note (Addendum)
Recent hospitalization with left lower lobe pneumonia in the setting of influenza A.  Patient is clinically improved.  Will check chest x-ray today for clearance.  Plan  Patient Instructions  Refer to Lung cancer CT chest screening program.  Chest xray today (follow up pneumonia ) .  Work on not smoking  Albuterol inhaler or neb As needed   Continue on Oxygen 2l/m with activity and At bedtime    Set up for split night sleep study .  Work on healthy sleep regimen  Do not drive if sleepy  Follow up in 6-8 weeks with PFT with Dr. Ander Slade or Brody Bonneau NP  and As needed  .

## 2022-07-19 NOTE — Patient Instructions (Addendum)
Refer to Lung cancer CT chest screening program.  Chest xray today (follow up pneumonia ) .  Work on not smoking  Albuterol inhaler or neb As needed   Continue on Oxygen 2l/m with activity and At bedtime    Set up for split night sleep study .  Work on healthy sleep regimen  Do not drive if sleepy  Follow up in 6-8 weeks with PFT with Dr. Ander Slade or Lorree Millar NP  and As needed  .

## 2022-07-19 NOTE — Progress Notes (Signed)
No covid vaccines.  Wants flu vaccine if he needs it.

## 2022-07-19 NOTE — Assessment & Plan Note (Addendum)
Discussed smoking cessation in detail Qualifies for the lung cancer screening program.  Referral made

## 2022-07-19 NOTE — Progress Notes (Signed)
Called and spoke with patient, advised of results/recommendations per Tammy Parrett NP.  He verbalized understanding.  Nothing further needed.

## 2022-07-19 NOTE — Assessment & Plan Note (Addendum)
Snoring, daytime sleepiness, restless sleep, BMI 39, chronic narcotic use, chronic oxygen use all concerning for underlying sleep apnea.  Will set patient up for a split-night sleep study.  Patient will need an in lab study as the patient is on oxygen.  Also have suspicion for underlying obesity hypoventilation syndrome.   - discussed how weight can impact sleep and risk for sleep disordered breathing - discussed options to assist with weight loss: combination of diet modification, cardiovascular and strength training exercises   - had an extensive discussion regarding the adverse health consequences related to untreated sleep disordered breathing - specifically discussed the risks for hypertension, coronary artery disease, cardiac dysrhythmias, cerebrovascular disease, and diabetes - lifestyle modification discussed   - discussed how sleep disruption can increase risk of accidents, particularly when driving - safe driving practices were discussed    Plan  Patient Instructions  Refer to Lung cancer CT chest screening program.  Chest xray today (follow up pneumonia ) .  Work on not smoking  Albuterol inhaler or neb As needed   Continue on Oxygen 2l/m with activity and At bedtime    Set up for split night sleep study .  Work on healthy sleep regimen  Do not drive if sleepy  Follow up in 6-8 weeks with PFT with Dr. Ander Slade or Khamila Bassinger NP  and As needed  .

## 2022-07-19 NOTE — Assessment & Plan Note (Addendum)
Recent hospitalization with influenza and community-acquired pneumonia.  Patient has ongoing exertional hypoxemia.  Will continue on oxygen to maintain O2 saturations greater than 88 to 9%.  Check PFTs on return.  Patient has a very heavy smoking history concern for underlying COPD. Albuterol and albuterol nebulizer as needed.  Encouraged smoking cessation  Plan  Patient Instructions  Refer to Lung cancer CT chest screening program.  Chest xray today (follow up pneumonia ) .  Work on not smoking  Albuterol inhaler or neb As needed   Continue on Oxygen 2l/m with activity and At bedtime    Set up for split night sleep study .  Work on healthy sleep regimen  Do not drive if sleepy  Follow up in 6-8 weeks with PFT with Dr. Ander Slade or Tiane Szydlowski NP  and As needed  .

## 2022-07-19 NOTE — Progress Notes (Signed)
$@PatientY$  ID: Jacob Bradford, male    DOB: January 20, 1961, 62 y.o.   MRN: [id number]  Chief Complaint  Patient presents with   Consult    Referring provider: Everardo Beals, NP  HPI: 62 year old male active smoker seen for consult July 19, 2022 History of Polycythemia Vera -interval phlebotomy.   TEST/EVENTS :  Chest 06/01/22 LLL opacity   07/19/2022  Consult  Patient presents for a sleep consult today.  Patient has significant snoring, daytime sleepiness, restless sleep.  Always feels tired and sleepy.  Patient says he has very fragmented sleep schedule.  Sleeps a couple hours through the night then gets up then go back to sleep and then sleeps some during the daytime as well.  He has no history of congestive heart failure or stroke.  Usually takes at least 2 naps a day at least for 1 to 2 hours during the daytime.  Has no removable dental work.  Drinks 5 sodas daily.  Also takes 3 Goody powders for long period of time for the caffeine content.  Patient is never had a sleep study before.  Weight is up about 30 pounds in the last 2 years.  Current weight is at 283 pounds with a BMI at 39.  Patient is on chronic pain medicines with morphine.  Patient was admitted to the hospital last month with influenza with superimposed left lower lobe pneumonia.  Chest x-ray showed left lower lobe airspace opacity.  He was treated with antibiotics and Tamiflu. Patient does continue smoke.  We discussed smoking cessation.  Discharged on albuterol inhaler and nebs. .  No previous diagnosis of COPD or asthma. Discharged on Oxygen , no longer wearing for last last 2 weeks. Does not feel he needs it. No albuterol use in last week. Feeling better. Less cough and congestion. Cough is minimally productive. No hemoptysis . No weight loss.  Started smoking at age 62 , up to 2 PPD x 25 yr. Currently down to 2 cigs daily .  Disabled from chronic back and hip pain . On chronic narcotics, on Morphine.  Takes Xanax  for anxiety .Take 3-4 times daily.  Walk test today in the office shows desaturations on room air around 84% requires 2 L of oxygen to maintain O2 saturations greater than 88%.     No Known Allergies  Immunization History  Administered Date(s) Administered   Influenza Inj Mdck Quad With Preservative 03/26/2019   Influenza Split 04/14/2013   Influenza,inj,Quad PF,6+ Mos 04/13/2013, 03/01/2014, 05/31/2020   Influenza-Unspecified 04/13/2013, 04/15/2021   Pneumococcal Conjugate-13 03/11/2020   Pneumococcal Polysaccharide-23 04/27/2019   Td 03/01/2005   Zoster Recombinat (Shingrix) 04/24/2019, 04/27/2019    Past Medical History:  Diagnosis Date   Anxiety    Arthritis    Depression    GERD (gastroesophageal reflux disease)    Hyperlipidemia    Hypertension    Lunotriquetral ligament tear 10/11/2014   Polycythemia vera ([place])    Polycythemia vera ([place])    Spinal stenosis    Tear of anterior cruciate ligament of knee 10/11/2014    Tobacco History: Social History   Tobacco Use  Smoking Status Former   Packs/day: 2.50   Years: 32.00   Total pack years: 80.00   Types: Cigarettes, Cigars   Start date: 07/14/1979   Quit date: 07/02/2021   Years since quitting: 1.0  Smokeless Tobacco Never  Tobacco Comments   No vaping.  Trying to quit smoking.  Smoking 1-2 per day if that.  Wearing a  patch.   Counseling given: Not Answered Tobacco comments: No vaping.  Trying to quit smoking.  Smoking 1-2 per day if that.  Wearing a patch.   Outpatient Medications Prior to Visit  Medication Sig Dispense Refill   acetaminophen (TYLENOL) 500 MG tablet Take 1,000 mg by mouth every 6 (six) hours as needed for moderate pain.     albuterol (VENTOLIN HFA) 108 (90 Base) MCG/ACT inhaler Inhale 2 puffs into the lungs every 6 (six) hours as needed for wheezing or shortness of breath. 8 g 2   ALPRAZolam (XANAX) 1 MG tablet Take 1 tablet (1 mg total) by mouth 3 (three) times daily as needed for anxiety. 30  tablet 0   aspirin 81 MG tablet Take 1 tablet (81 mg total) by mouth daily. 30 tablet 12   Aspirin-Acetaminophen-Caffeine (GOODYS EXTRA STRENGTH) 500-325-65 MG PACK Take 1 packet by mouth 3 (three) times daily as needed (pain).     calcium carbonate (TUMS - DOSED IN MG ELEMENTAL CALCIUM) 500 MG chewable tablet Chew 1 tablet by mouth 2 (two) times daily as needed for indigestion.     furosemide (LASIX) 20 MG tablet Take 20 mg by mouth daily as needed for edema or fluid.     ipratropium (ATROVENT) 0.02 % nebulizer solution Inhale 2.5 mL as needed by inhalation route for 1 day.     losartan-hydrochlorothiazide (HYZAAR) 100-12.5 MG tablet Take 1 tablet by mouth daily.     morphine (MS CONTIN) 60 MG 12 hr tablet Take 1 tablet (60 mg total) by mouth 2 (two) times daily. 60 tablet 0   morphine (MSIR) 30 MG tablet Take 1-2 every 6 hrs as needed for pain 180 tablet 0   naloxone (NARCAN) nasal spray 4 mg/0.1 mL      nicotine (NICODERM CQ - DOSED IN MG/24 HOURS) 14 mg/24hr patch Place 1 patch (14 mg total) onto the skin daily. 28 patch 0   omeprazole (PRILOSEC) 20 MG capsule Take 20 mg by mouth daily.     polyethylene glycol (MIRALAX / GLYCOLAX) packet Take 17 g by mouth daily as needed for moderate constipation.     atorvastatin (LIPITOR) 20 MG tablet Take 20 mg by mouth daily. (Patient not taking: Reported on 06/22/2022)     esomeprazole (NEXIUM) 40 MG capsule Take 40 mg by mouth daily at 12 noon. (Patient not taking: Reported on 06/22/2022)     gemfibrozil (LOPID) 600 MG tablet Take 600 mg by mouth 2 (two) times daily. (Patient not taking: Reported on 06/22/2022)     No facility-administered medications prior to visit.     Review of Systems:   Constitutional:   No  weight loss, night sweats,  Fevers, chills,  +fatigue, or  lassitude.  HEENT:   No headaches,  Difficulty swallowing,  Tooth/dental problems, or  Sore throat,                No sneezing, itching, ear ache, nasal congestion, post nasal  drip,   CV:  No chest pain,  Orthopnea, PND, swelling in lower extremities, anasarca, dizziness, palpitations, syncope.   GI  No heartburn, indigestion, abdominal pain, nausea, vomiting, diarrhea, change in bowel habits, loss of appetite, bloody stools.   Resp:   No chest wall deformity  Skin: no rash or lesions.  GU: no dysuria, change in color of urine, no urgency or frequency.  No flank pain, no hematuria   MS:  No joint pain or swelling.  No decreased range of motion.  No back pain.    Physical Exam  BP 130/70 (BP Location: Left Arm, Patient Position: Sitting, Cuff Size: Large)   Pulse 96   Temp 97.8 F (36.6 C) (Oral)   Ht 5' 11"$  (1.803 m)   Wt 283 lb 9.6 oz (128.6 kg)   SpO2 95% Comment: up to 99% after resting.  BMI 39.55 kg/m   GEN: A/Ox3; pleasant , NAD, well nourished    HEENT:  [place]/AT,  , NOSE-clear, THROAT-clear, no lesions, no postnasal drip or exudate noted.  Class III MP airway  NECK:  Supple w/ fair ROM; no JVD; normal carotid impulses w/o bruits; no thyromegaly or nodules palpated; no lymphadenopathy.    RESP  Clear  P & A; w/o, wheezes/ rales/ or rhonchi. no accessory muscle use, no dullness to percussion  CARD:  RRR, no m/r/g, no peripheral edema, pulses intact, no cyanosis or clubbing.  GI:   Soft & nt; nml bowel sounds; no organomegaly or masses detected.   Musco: Warm bil, no deformities or joint swelling noted.   Neuro: alert, no focal deficits noted.    Skin: Warm, no lesions or rashes    Lab Results:  CBC    Component Value Date/Time   WBC 11.5 (H) 06/22/2022 1226   WBC 4.8 06/04/2022 0329   RBC 5.75 06/22/2022 1226   HGB 15.5 06/22/2022 1226   HGB 15.3 05/09/2017 0910   HGB 15.9 10/10/2007 1435   HCT 48.4 06/22/2022 1226   HCT 47.6 05/09/2017 0910   HCT 46.5 10/10/2007 1435   PLT 265 06/22/2022 1226   PLT 158 05/09/2017 0910   PLT 228 10/10/2007 1435   MCV 84.2 06/22/2022 1226   MCV 84 05/09/2017 0910   MCV 83.8 10/10/2007  1435   MCH 27.0 06/22/2022 1226   MCHC 32.0 06/22/2022 1226   RDW 14.0 06/22/2022 1226   RDW 15.9 (H) 05/09/2017 0910   RDW 14.3 10/10/2007 1435   LYMPHSABS 2.0 06/22/2022 1226   LYMPHSABS 1.4 05/09/2017 0910   LYMPHSABS 1.6 10/10/2007 1435   MONOABS 1.0 06/22/2022 1226   MONOABS 0.5 10/10/2007 1435   EOSABS 0.2 06/22/2022 1226   EOSABS 0.3 05/09/2017 0910   BASOSABS 0.1 06/22/2022 1226   BASOSABS 0.1 05/09/2017 0910   BASOSABS 0.1 10/10/2007 1435    BMET    Component Value Date/Time   NA 138 06/22/2022 1226   NA 141 05/09/2017 0910   K 3.8 06/22/2022 1226   K 4.2 05/09/2017 0910   CL 103 06/22/2022 1226   CL 105 11/13/2016 1123   CO2 26 06/22/2022 1226   CO2 24 05/09/2017 0910   GLUCOSE 150 (H) 06/22/2022 1226   GLUCOSE 141 (H) 05/09/2017 0910   GLUCOSE 117 11/13/2016 1123   BUN 16 06/22/2022 1226   BUN 15.2 05/09/2017 0910   CREATININE 1.23 06/22/2022 1226   CREATININE 1.2 05/09/2017 0910   CALCIUM 9.4 06/22/2022 1226   CALCIUM 9.1 05/09/2017 0910   GFRNONAA >60 06/22/2022 1226   GFRAA >60 02/04/2020 0838    BNP    Component Value Date/Time   BNP 105.7 (H) 06/01/2022 1526    ProBNP No results found for: "PROBNP"  Imaging: No results found.        No data to display          No results found for: "NITRICOXIDE"      Assessment & Plan:   Community acquired pneumonia Recent hospitalization with left lower lobe pneumonia in the setting of influenza  A.  Patient is clinically improved.  Will check chest x-ray today for clearance.  Plan  Patient Instructions  Refer to Lung cancer CT chest screening program.  Chest xray today (follow up pneumonia ) .  Work on not smoking  Albuterol inhaler or neb As needed   Continue on Oxygen 2l/m with activity and At bedtime    Set up for split night sleep study .  Work on healthy sleep regimen  Do not drive if sleepy  Follow up in 6-8 weeks with PFT with Dr. Ander Slade or Mairyn Lenahan NP  and As needed  .      Snoring Snoring, daytime sleepiness, restless sleep, BMI 39, chronic narcotic use, chronic oxygen use all concerning for underlying sleep apnea.  Will set patient up for a split-night sleep study.  Patient will need an in lab study as the patient is on oxygen.  Also have suspicion for underlying obesity hypoventilation syndrome.   - discussed how weight can impact sleep and risk for sleep disordered breathing - discussed options to assist with weight loss: combination of diet modification, cardiovascular and strength training exercises   - had an extensive discussion regarding the adverse health consequences related to untreated sleep disordered breathing - specifically discussed the risks for hypertension, coronary artery disease, cardiac dysrhythmias, cerebrovascular disease, and diabetes - lifestyle modification discussed   - discussed how sleep disruption can increase risk of accidents, particularly when driving - safe driving practices were discussed    Plan  Patient Instructions  Refer to Lung cancer CT chest screening program.  Chest xray today (follow up pneumonia ) .  Work on not smoking  Albuterol inhaler or neb As needed   Continue on Oxygen 2l/m with activity and At bedtime    Set up for split night sleep study .  Work on healthy sleep regimen  Do not drive if sleepy  Follow up in 6-8 weeks with PFT with Dr. Ander Slade or Symir Mah NP  and As needed  .      Chronic respiratory failure with hypoxia (HCC) Recent hospitalization with influenza and community-acquired pneumonia.  Patient has ongoing exertional hypoxemia.  Will continue on oxygen to maintain O2 saturations greater than 88 to 9%.  Check PFTs on return.  Patient has a very heavy smoking history concern for underlying COPD. Albuterol and albuterol nebulizer as needed.  Encouraged smoking cessation  Plan  Patient Instructions  Refer to Lung cancer CT chest screening program.  Chest xray today (follow up  pneumonia ) .  Work on not smoking  Albuterol inhaler or neb As needed   Continue on Oxygen 2l/m with activity and At bedtime    Set up for split night sleep study .  Work on healthy sleep regimen  Do not drive if sleepy  Follow up in 6-8 weeks with PFT with Dr. Ander Slade or Jaia Alonge NP  and As needed  .     Tobacco abuse Discussed smoking cessation in detail Qualifies for the lung cancer screening program.  Referral made   I spent  60  minutes dedicated to the care of this patient on the date of this encounter to include pre-visit review of records, face-to-face time with the patient discussing conditions above, post visit ordering of testing, clinical documentation with the electronic health record, making appropriate referrals as documented, and communicating necessary findings to members of the patients care team.    Rexene Edison, NP 07/19/2022

## 2022-07-20 ENCOUNTER — Other Ambulatory Visit: Payer: Self-pay | Admitting: Hematology & Oncology

## 2022-07-20 DIAGNOSIS — D45 Polycythemia vera: Secondary | ICD-10-CM

## 2022-07-20 NOTE — Progress Notes (Signed)
Reviewed and agree with assessment/plan.   Chesley Mires, MD [facility] Pulmonary/Critical Care 07/20/2022, 10:38 AM Pager:  [phone]

## 2022-07-24 MED ORDER — MORPHINE SULFATE 30 MG PO TABS: ORAL_TABLET | ORAL | 0 refills | Status: DC

## 2022-07-24 MED ORDER — MORPHINE SULFATE ER 60 MG PO TBCR: 60.0000 mg | EXTENDED_RELEASE_TABLET | Freq: Two times a day (BID) | ORAL | 0 refills | Status: DC

## 2022-07-26 DIAGNOSIS — Z23 Encounter for immunization: Secondary | ICD-10-CM | POA: Diagnosis not present

## 2022-08-03 ENCOUNTER — Encounter: Payer: Self-pay | Admitting: Hematology & Oncology

## 2022-08-03 ENCOUNTER — Inpatient Hospital Stay: Payer: Medicare HMO | Attending: Hematology & Oncology

## 2022-08-03 ENCOUNTER — Inpatient Hospital Stay: Payer: Medicare HMO | Admitting: Hematology & Oncology

## 2022-08-03 ENCOUNTER — Inpatient Hospital Stay: Payer: Medicare HMO

## 2022-08-03 ENCOUNTER — Other Ambulatory Visit: Payer: Self-pay

## 2022-08-03 VITALS — BP 118/67 | HR 77 | Resp 20

## 2022-08-03 VITALS — BP 117/62 | HR 75 | Temp 98.4°F | Resp 19 | Ht 71.0 in | Wt 292.0 lb

## 2022-08-03 DIAGNOSIS — D45 Polycythemia vera: Secondary | ICD-10-CM

## 2022-08-03 LAB — CMP (CANCER CENTER ONLY)
ALT: 32 U/L (ref 0–44)
AST: 21 U/L (ref 15–41)
Albumin: 4.3 g/dL (ref 3.5–5.0)
Alkaline Phosphatase: 113 U/L (ref 38–126)
Anion gap: 9 (ref 5–15)
BUN: 22 mg/dL (ref 8–23)
CO2: 31 mmol/L (ref 22–32)
Calcium: 9.4 mg/dL (ref 8.9–10.3)
Chloride: 96 mmol/L — ABNORMAL LOW (ref 98–111)
Creatinine: 1.27 mg/dL — ABNORMAL HIGH (ref 0.61–1.24)
GFR, Estimated: >60 mL/min (ref >60)
Glucose, Bld: 163 mg/dL — ABNORMAL HIGH (ref 70–99)
Potassium: 4.9 mmol/L (ref 3.5–5.1)
Sodium: 136 mmol/L (ref 135–145)
Total Bilirubin: 0.4 mg/dL (ref 0.3–1.2)
Total Protein: 7.2 g/dL (ref 6.5–8.1)

## 2022-08-03 LAB — CBC WITH DIFFERENTIAL (CANCER CENTER ONLY)
Abs Immature Granulocytes: 0.06 10*3/uL (ref 0.00–0.07)
Basophils Absolute: 0.1 10*3/uL (ref 0.0–0.1)
Basophils Relative: 1 %
Eosinophils Absolute: 0.3 10*3/uL (ref 0.0–0.5)
Eosinophils Relative: 3 %
HCT: 46.7 % (ref 39.0–52.0)
Hemoglobin: 14.8 g/dL (ref 13.0–17.0)
Immature Granulocytes: 1 %
Lymphocytes Relative: 18 %
Lymphs Abs: 1.7 10*3/uL (ref 0.7–4.0)
MCH: 26.6 pg (ref 26.0–34.0)
MCHC: 31.7 g/dL (ref 30.0–36.0)
MCV: 84 fL (ref 80.0–100.0)
Monocytes Absolute: 0.6 10*3/uL (ref 0.1–1.0)
Monocytes Relative: 6 %
Neutro Abs: 6.6 10*3/uL (ref 1.7–7.7)
Neutrophils Relative %: 71 %
Platelet Count: 251 10*3/uL (ref 150–400)
RBC: 5.56 MIL/uL (ref 4.22–5.81)
RDW: 14.1 % (ref 11.5–15.5)
WBC Count: 9.4 10*3/uL (ref 4.0–10.5)
nRBC: 0 % (ref 0.0–0.2)

## 2022-08-03 LAB — LACTATE DEHYDROGENASE: LDH: 166 U/L (ref 98–192)

## 2022-08-03 LAB — SAVE SMEAR(SSMR), FOR PROVIDER SLIDE REVIEW
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
## 2022-08-03 NOTE — Progress Notes (Signed)
Jacob Bradford presents today for phlebotomy per MD orders. Phlebotomy procedure started at 1315 with 20 gauge angiocath and ended at 1344. 530 grams removed. Refreshments offered to patient Patient observed for 30 minutes after procedure without any incident. Patient tolerated procedure well. IV needle removed intact.

## 2022-08-03 NOTE — Patient Instructions (Signed)

## 2022-08-20 ENCOUNTER — Other Ambulatory Visit: Payer: Self-pay | Admitting: Hematology & Oncology

## 2022-08-20 DIAGNOSIS — D45 Polycythemia vera: Secondary | ICD-10-CM

## 2022-08-20 MED ORDER — MORPHINE SULFATE ER 60 MG PO TBCR: 60.0000 mg | EXTENDED_RELEASE_TABLET | Freq: Two times a day (BID) | ORAL | 0 refills | Status: DC

## 2022-09-07 ENCOUNTER — Encounter: Payer: Self-pay | Admitting: Adult Health

## 2022-09-07 ENCOUNTER — Ambulatory Visit: Payer: Medicare HMO | Admitting: Adult Health

## 2022-09-07 ENCOUNTER — Ambulatory Visit (INDEPENDENT_AMBULATORY_CARE_PROVIDER_SITE_OTHER): Payer: Medicare HMO | Admitting: Pulmonary Disease

## 2022-09-07 VITALS — BP 136/70 | HR 73 | Temp 97.9°F | Ht 72.0 in | Wt 296.0 lb

## 2022-09-07 DIAGNOSIS — Z6841 Body Mass Index (BMI) 40.0 and over, adult: Secondary | ICD-10-CM | POA: Diagnosis not present

## 2022-09-07 DIAGNOSIS — F17200 Nicotine dependence, unspecified, uncomplicated: Secondary | ICD-10-CM | POA: Diagnosis not present

## 2022-09-07 DIAGNOSIS — J189 Pneumonia, unspecified organism: Secondary | ICD-10-CM | POA: Diagnosis not present

## 2022-09-07 DIAGNOSIS — Z720 Tobacco use: Secondary | ICD-10-CM

## 2022-09-07 DIAGNOSIS — J449 Chronic obstructive pulmonary disease, unspecified: Secondary | ICD-10-CM

## 2022-09-07 DIAGNOSIS — J9611 Chronic respiratory failure with hypoxia: Secondary | ICD-10-CM | POA: Diagnosis not present

## 2022-09-07 DIAGNOSIS — R0683 Snoring: Secondary | ICD-10-CM

## 2022-09-07 LAB — PULMONARY FUNCTION TEST
DL/VA % pred: 102 %
DL/VA: 4.27 ml/min/mmHg/L
DLCO cor % pred: 80 %
DLCO cor: 23.42 ml/min/mmHg
DLCO unc % pred: 80 %
DLCO unc: 23.55 ml/min/mmHg
FEF 25-75 Pre: 2.21 L/sec
FEF2575-%Pred-Pre: 71 %
FEV1-%Pred-Pre: 65 %
FEV1-Pre: 2.51 L
FEV1FVC-%Pred-Pre: 100 %
FEV6-%Pred-Pre: 67 %
FEV6-Pre: 3.29 L
FEV6FVC-%Pred-Pre: 104 %
FVC-%Pred-Pre: 64 %
FVC-Pre: 3.3 L
Pre FEV1/FVC ratio: 76 %
Pre FEV6/FVC Ratio: 100 %
RV % pred: 102 %
RV: 2.44 L
TLC % pred: 78 %
TLC: 5.8 L

## 2022-09-07 MED ORDER — SPIRIVA RESPIMAT 2.5 MCG/ACT IN AERS: 2.0000 | INHALATION_SPRAY | Freq: Every day | RESPIRATORY_TRACT | 5 refills | Status: DC

## 2022-09-07 MED ORDER — SPIRIVA RESPIMAT 2.5 MCG/ACT IN AERS: 2.0000 | INHALATION_SPRAY | Freq: Every day | RESPIRATORY_TRACT | 0 refills | Status: DC

## 2022-09-07 NOTE — Assessment & Plan Note (Signed)
Encouraged on weight loss 

## 2022-09-07 NOTE — Assessment & Plan Note (Signed)
PFTs show more moderate restriction possibly secondary to morbid obesity.  No significant airflow obstruction.  Diffusing capacity is normal. Patient has a heavy smoking history.  Encouraged smoking cessation.  Will add Spiriva 2 puffs daily to help with symptom burden.  Plan  Patient Instructions  Refer to Lung cancer CT chest screening program.  Try Spiriva 2 puff daily .  Work on not smoking  Albuterol inhaler or neb As needed   Continue on Oxygen 2l/m with activity and At bedtime    Set up for split night sleep study .  Work on healthy sleep regimen  Do not drive if sleepy  Follow up in 3 months with Jacob Bradford or Jacob Buxton NP  and As needed  .

## 2022-09-07 NOTE — Addendum Note (Signed)
Addended by: Delrae Rend on: 09/07/2022 02:51 PM   Modules accepted: Orders

## 2022-09-07 NOTE — Progress Notes (Signed)
@Patient  ID: Jacob Bradford, male    DOB: 1960-10-18, 62 y.o.   MRN: [id number]  Chief Complaint  Patient presents with   Follow-up    Referring provider: Marva Panda, NP  HPI: 62 year old male active smoker seen for pulmonary and sleep consult on July 19, 2022.  Complained of snoring and daytime sleepiness.  Concern for possible underlying COPD with heavy smoking history.  Hospitalized January 2024 for pneumonia and influenza. Medical history significant for chronic pain syndrome on narcotics.  Chronic anxiety on Xanax  TEST/EVENTS :   09/07/2022 Follow up ; CAP  Patient presents for a 6-week follow-up.  Patient was seen last visit for a pulmonary and sleep consult.  Patient complained of snoring, daytime sleepiness and restless sleep.  He was set up for a split-night study.  Patient has chronic pain syndrome is on narcotic medications along with anxiolytics and is on oxygen.  Concern for component of obesity hypoventilation syndrome.  Unfortunately his insurance has not approved his sleep study today. Patient also was evaluated after recent hospitalization in January 2024 with influenza and left lower lobe pneumonia.  He was treated with empiric antibiotics and Tamiflu.  Patient says he is feeling better.  He has concern for possible underlying COPD as he has a heavy smoking history. Chest x-ray last visit showed a more linear opacity in the left base consistent with residual scarring.  Patient has a heavy smoking history was referred to the lung cancer CT screening program which is pending. Patient says he does get short of breath with some activities.  Has occasional cough and wheezing.  He was set up for PFTs that showed moderate restriction with an FEV1 at 65%, ratio 76, FVC 64%, DLCO 80%.  Was unable to do the postbronchodilator portion of the test.   No Known Allergies  Immunization History  Administered Date(s) Administered   Influenza Inj Mdck Quad With Preservative  03/26/2019   Influenza Split 04/14/2013   Influenza,inj,Quad PF,6+ Mos 04/13/2013, 03/01/2014, 05/31/2020, 07/26/2022   Influenza-Unspecified 04/13/2013, 04/15/2021   Pneumococcal Conjugate-13 03/11/2020   Pneumococcal Polysaccharide-23 04/27/2019   Td 03/01/2005   Zoster Recombinat (Shingrix) 04/24/2019, 04/27/2019    Past Medical History:  Diagnosis Date   Anxiety    Arthritis    Depression    GERD (gastroesophageal reflux disease)    Hyperlipidemia    Hypertension    Lunotriquetral ligament tear 10/11/2014   Polycythemia vera    Polycythemia vera    Spinal stenosis    Tear of anterior cruciate ligament of knee 10/11/2014    Tobacco History: Social History   Tobacco Use  Smoking Status Former   Packs/day: 2.50   Years: 32.00   Additional pack years: 0.00   Total pack years: 80.00   Types: Cigarettes, Cigars   Start date: 07/14/1979   Quit date: 07/02/2021   Years since quitting: 1.1  Smokeless Tobacco Never  Tobacco Comments   No vaping.  Trying to quit smoking.  Smoking 1-2 per day if that.  Wearing a patch. 09/07/2022 hfb   Counseling given: Not Answered Tobacco comments: No vaping.  Trying to quit smoking.  Smoking 1-2 per day if that.  Wearing a patch. 09/07/2022 hfb   Outpatient Medications Prior to Visit  Medication Sig Dispense Refill   acetaminophen (TYLENOL) 500 MG tablet Take 1,000 mg by mouth every 6 (six) hours as needed for moderate pain.     albuterol (PROVENTIL) (2.5 MG/3ML) 0.083% nebulizer solution SMARTSIG:3 Milliliter(s) Via Nebulizer 3  Times Daily PRN     albuterol (VENTOLIN HFA) 108 (90 Base) MCG/ACT inhaler Inhale 2 puffs into the lungs every 6 (six) hours as needed for wheezing or shortness of breath. 8 g 2   ALPRAZolam (XANAX) 1 MG tablet Take 1 tablet (1 mg total) by mouth 3 (three) times daily as needed for anxiety. 30 tablet 0   aspirin 81 MG tablet Take 1 tablet (81 mg total) by mouth daily. 30 tablet 12   Aspirin-Acetaminophen-Caffeine (GOODYS  EXTRA STRENGTH) 500-325-65 MG PACK Take 1 packet by mouth 3 (three) times daily as needed (pain).     calcium carbonate (TUMS - DOSED IN MG ELEMENTAL CALCIUM) 500 MG chewable tablet Chew 1 tablet by mouth 2 (two) times daily as needed for indigestion.     esomeprazole (NEXIUM) 40 MG capsule Take 40 mg by mouth daily at 12 noon.     furosemide (LASIX) 20 MG tablet Take 20 mg by mouth daily as needed for edema or fluid.     losartan-hydrochlorothiazide (HYZAAR) 100-12.5 MG tablet Take 1 tablet by mouth daily.     morphine (MS CONTIN) 60 MG 12 hr tablet Take 1 tablet (60 mg total) by mouth 2 (two) times daily. 60 tablet 0   morphine (MSIR) 30 MG tablet Take 60 mg by mouth every 6 (six) hours as needed.     naloxone (NARCAN) nasal spray 4 mg/0.1 mL      polyethylene glycol (MIRALAX / GLYCOLAX) packet Take 17 g by mouth daily as needed for moderate constipation.     ipratropium (ATROVENT) 0.02 % nebulizer solution Inhale 2.5 mL as needed by inhalation route for 1 day.     atorvastatin (LIPITOR) 20 MG tablet Take 20 mg by mouth daily. (Patient not taking: Reported on 06/22/2022)     gemfibrozil (LOPID) 600 MG tablet Take 600 mg by mouth 2 (two) times daily. (Patient not taking: Reported on 06/22/2022)     nicotine (NICODERM CQ - DOSED IN MG/24 HOURS) 14 mg/24hr patch Place 1 patch (14 mg total) onto the skin daily. (Patient not taking: Reported on 09/07/2022) 28 patch 0   morphine (MS CONTIN) 60 MG 12 hr tablet Take 1 tablet by mouth 2 (two) times daily. (Patient not taking: Reported on 09/07/2022)     omeprazole (PRILOSEC) 20 MG capsule Take 20 mg by mouth daily. (Patient not taking: Reported on 09/07/2022)     No facility-administered medications prior to visit.     Review of Systems:   Constitutional:   No  weight loss, night sweats,  Fevers, chills,  +fatigue, or  lassitude.  HEENT:   No headaches,  Difficulty swallowing,  Tooth/dental problems, or  Sore throat,                No sneezing, itching,  ear ache, nasal congestion, post nasal drip,   CV:  No chest pain,  Orthopnea, PND, swelling in lower extremities, anasarca, dizziness, palpitations, syncope.   GI  No heartburn, indigestion, abdominal pain, nausea, vomiting, diarrhea, change in bowel habits, loss of appetite, bloody stools.   Resp:   No chest wall deformity  Skin: no rash or lesions.  GU: no dysuria, change in color of urine, no urgency or frequency.  No flank pain, no hematuria   MS:  No joint pain or swelling.  No decreased range of motion.  No back pain.    Physical Exam  BP 136/70 (BP Location: Left Arm, Patient Position: Sitting, Cuff Size: Large)  Pulse 73   Temp 97.9 F (36.6 C) (Oral)   Ht 6' (1.829 m)   Wt 296 lb (134.3 kg)   SpO2 95%   BMI 40.14 kg/m   GEN: A/Ox3; pleasant , NAD, well nourished    HEENT:  [place]/AT,  THROAT-clear, no lesions, no postnasal drip or exudate noted.  Class III MP airway  NECK:  Supple w/ fair ROM; no JVD; normal carotid impulses w/o bruits; no thyromegaly or nodules palpated; no lymphadenopathy.    RESP  Clear  P & A; w/o, wheezes/ rales/ or rhonchi. no accessory muscle use, no dullness to percussion  CARD:  RRR, no m/r/g, tr peripheral edema, pulses intact, no cyanosis or clubbing.  GI:   Soft & nt; nml bowel sounds; no organomegaly or masses detected.   Musco: Warm bil, no deformities or joint swelling noted.   Neuro: alert, no focal deficits noted.    Skin: Warm, no lesions or rashes    Lab Results:  CBC    ProBNP No results found for: "PROBNP"  Imaging: No results found.       Latest Ref Rng & Units 09/07/2022   11:49 AM  PFT Results  FVC-Pre L 3.30  P  FVC-Predicted Pre % 64  P  Pre FEV1/FVC % % 76  P  FEV1-Pre L 2.51  P  FEV1-Predicted Pre % 65  P  DLCO uncorrected ml/min/mmHg 23.55  P  DLCO UNC% % 80  P  DLCO corrected ml/min/mmHg 23.42  P  DLCO COR %Predicted % 80  P  DLVA Predicted % 102  P  TLC L 5.80  P  TLC % Predicted % 78  P   RV % Predicted % 102  P    P Preliminary result    No results found for: "NITRICOXIDE"      Assessment & Plan:   Snoring Loud snoring, restless sleep, daytime sleepiness, BMI 40, chronic respiratory failure on oxygen-patient is high risk for underlying sleep apnea and suspected OHS.  Split-night sleep study is pending.  Unfortunately insurance has not approved to date.  Will proceed once insurance approval  Plan  Patient Instructions  Refer to Lung cancer CT chest screening program.  Try Spiriva 2 puff daily .  Work on not smoking  Albuterol inhaler or neb As needed   Continue on Oxygen 2l/m with activity and At bedtime    Set up for split night sleep study .  Work on healthy sleep regimen  Do not drive if sleepy  Follow up in 3 months with Dr. Wynona Neat or Sascha Baugher NP  and As needed  .     Chronic respiratory failure with hypoxia (HCC) Patient is continue on oxygen 2 L with activity and at bedtime to maintain O2 saturations greater than 88 to 90%.  COPD (chronic obstructive pulmonary disease) PFTs show more moderate restriction possibly secondary to morbid obesity.  No significant airflow obstruction.  Diffusing capacity is normal. Patient has a heavy smoking history.  Encouraged smoking cessation.  Will add Spiriva 2 puffs daily to help with symptom burden.  Plan  Patient Instructions  Refer to Lung cancer CT chest screening program.  Try Spiriva 2 puff daily .  Work on not smoking  Albuterol inhaler or neb As needed   Continue on Oxygen 2l/m with activity and At bedtime    Set up for split night sleep study .  Work on healthy sleep regimen  Do not drive if sleepy  Follow up in  3 months with Dr. Wynona Neat or Aloni Chuang NP  and As needed  .     Community acquired pneumonia Left lower lobe pneumonia recent chest x-ray showed some residual left basilar atelectasis/scarring.   Body mass index 40.0-44.9, adult (HCC) Encouraged on weight loss  Tobacco abuse Smoking  cessation discussed    Rubye Oaks, NP 09/07/2022

## 2022-09-07 NOTE — Progress Notes (Signed)
Pre Spiro, DLCO and Pleth performed today. 

## 2022-09-07 NOTE — Assessment & Plan Note (Signed)
Left lower lobe pneumonia recent chest x-ray showed some residual left basilar atelectasis/scarring.

## 2022-09-07 NOTE — Assessment & Plan Note (Signed)
Loud snoring, restless sleep, daytime sleepiness, BMI 40, chronic respiratory failure on oxygen-patient is high risk for underlying sleep apnea and suspected OHS.  Split-night sleep study is pending.  Unfortunately insurance has not approved to date.  Will proceed once insurance approval  Plan  Patient Instructions  Refer to Lung cancer CT chest screening program.  Try Spiriva 2 puff daily .  Work on not smoking  Albuterol inhaler or neb As needed   Continue on Oxygen 2l/m with activity and At bedtime    Set up for split night sleep study .  Work on healthy sleep regimen  Do not drive if sleepy  Follow up in 3 months with Jacob Bradford or Jacob Rawl NP  and As needed  .

## 2022-09-07 NOTE — Assessment & Plan Note (Signed)
Patient is continue on oxygen 2 L with activity and at bedtime to maintain O2 saturations greater than 88 to 90%. 

## 2022-09-07 NOTE — Progress Notes (Signed)
Patient seen in the office today and instructed on use of spiriva.  Patient expressed understanding and demonstrated technique.  

## 2022-09-07 NOTE — Patient Instructions (Addendum)
Pre Spiro, DLCO and Pleth performed today. 

## 2022-09-07 NOTE — Patient Instructions (Addendum)
Refer to Lung cancer CT chest screening program.  Try Spiriva 2 puff daily .  Work on not smoking  Albuterol inhaler or neb As needed   Continue on Oxygen 2l/m with activity and At bedtime    Set up for split night sleep study .  Work on healthy sleep regimen  Do not drive if sleepy  Follow up in 3 months with Dr. Wynona Neat or Moody Robben NP  and As needed  .

## 2022-09-07 NOTE — Assessment & Plan Note (Signed)
Smoking cessation discussed 

## 2022-09-12 ENCOUNTER — Other Ambulatory Visit: Payer: Self-pay | Admitting: *Deleted

## 2022-09-12 DIAGNOSIS — F17210 Nicotine dependence, cigarettes, uncomplicated: Secondary | ICD-10-CM

## 2022-09-12 DIAGNOSIS — Z87891 Personal history of nicotine dependence: Secondary | ICD-10-CM

## 2022-09-12 DIAGNOSIS — Z122 Encounter for screening for malignant neoplasm of respiratory organs: Secondary | ICD-10-CM

## 2022-09-17 ENCOUNTER — Other Ambulatory Visit: Payer: Self-pay | Admitting: Hematology & Oncology

## 2022-09-17 DIAGNOSIS — D45 Polycythemia vera: Secondary | ICD-10-CM

## 2022-09-17 MED ORDER — MORPHINE SULFATE ER 60 MG PO TBCR: 60.0000 mg | EXTENDED_RELEASE_TABLET | Freq: Two times a day (BID) | ORAL | 0 refills | Status: DC

## 2022-09-17 NOTE — Telephone Encounter (Signed)
Last refilled 08/20/22 #60. Please advise for refills, thanks!

## 2022-09-19 ENCOUNTER — Other Ambulatory Visit: Payer: Self-pay | Admitting: Hematology & Oncology

## 2022-09-19 DIAGNOSIS — D45 Polycythemia vera: Secondary | ICD-10-CM

## 2022-09-20 ENCOUNTER — Other Ambulatory Visit: Payer: Self-pay | Admitting: Hematology & Oncology

## 2022-09-20 MED ORDER — MORPHINE SULFATE 30 MG PO TABS: 30.0000 mg | ORAL_TABLET | Freq: Four times a day (QID) | ORAL | 0 refills | Status: DC | PRN

## 2022-09-26 ENCOUNTER — Ambulatory Visit (HOSPITAL_BASED_OUTPATIENT_CLINIC_OR_DEPARTMENT_OTHER): Payer: Medicare HMO | Attending: Adult Health | Admitting: Pulmonary Disease

## 2022-09-26 VITALS — Ht 70.0 in | Wt 290.0 lb

## 2022-09-26 DIAGNOSIS — Z9981 Dependence on supplemental oxygen: Secondary | ICD-10-CM

## 2022-09-26 DIAGNOSIS — G4730 Sleep apnea, unspecified: Secondary | ICD-10-CM | POA: Diagnosis present

## 2022-09-26 DIAGNOSIS — G4733 Obstructive sleep apnea (adult) (pediatric): Secondary | ICD-10-CM | POA: Insufficient documentation

## 2022-09-28 DIAGNOSIS — Z9981 Dependence on supplemental oxygen: Secondary | ICD-10-CM

## 2022-09-28 NOTE — Procedures (Signed)
     Patient Name: Jacob Bradford, Jacob Bradford Date: 09/26/2022 Gender: Male D.O.B: Dec 02, 1960 Age (years): 56 Referring Provider: Babette Relic Parrett Height (inches): 70 Interpreting Physician: Coralyn Helling MD, ABSM Weight (lbs): 290 RPSGT: Lowry Ram BMI: 42 MRN: [id number] Neck Size: 18.50  CLINICAL INFORMATION The patient is referred for a split night study with BPAP.  MEDICATIONS Medications self-administered by patient taken the night of the study : MSIR, XANAX  SLEEP STUDY TECHNIQUE As per the AASM Manual for the Scoring of Sleep and Associated Events v2.3 (April 2016) with a hypopnea requiring 4% desaturations.  The channels recorded and monitored were frontal, central and occipital EEG, electrooculogram (EOG), submentalis EMG (chin), nasal and oral airflow, thoracic and abdominal wall motion, anterior tibialis EMG, snore microphone, electrocardiogram, and pulse oximetry. Bi-level positive airway pressure (BiPAP) was initiated when the patient met split night criteria and was titrated according to treat sleep-disordered breathing.  RESPIRATORY PARAMETERS Diagnostic  Total AHI (/hr): 122.4 RDI (/hr): 123.3 OA Index (/hr): 4 CA Index (/hr): 27.0 REM AHI (/hr): 94.2 NREM AHI (/hr): 131.5 Supine AHI (/hr): N/A Non-supine AHI (/hr): 123.27 Min O2 Sat (%): 69.0 Mean O2 (%): 82.7 Time below 88% (min): 128.6   Titration  Optimal IPAP Pressure (cm): 18 Optimal EPAP Pressure (cm): 14 AHI at Optimal Pressure (/hr): 8.8 Min O2 at Optimal Pressure (%): 88.0 Sleep % at Optimal (%): 82 Supine % at Optimal (%): 0   SLEEP ARCHITECTURE The study was initiated at 10:45:15 PM and terminated at 5:29:13 AM. The total recorded time was 404 minutes. EEG confirmed total sleep time was 327 minutes yielding a sleep efficiency of 80.9%. Sleep onset after lights out was 2.8 minutes with a REM latency of 63.0 minutes. The patient spent 4.9% of the night in stage N1 sleep, 56.3% in stage N2 sleep, 0.0%  in stage N3 and 38.8% in REM. Wake after sleep onset (WASO) was 74.2 minutes. The Arousal Index was 20.4/hour.  LEG MOVEMENT DATA The total Periodic Limb Movements of Sleep (PLMS) were 0. The PLMS index was 0.0 .  CARDIAC DATA The 2 lead EKG demonstrated sinus rhythm. The mean heart rate was 100.0 beats per minute. Other EKG findings include: PVCs.  IMPRESSIONS - Severe complex sleep apnea occurred with an overall AHI of 122.4 and SpO2 low of 69%.  His central apnea index (CAI) was 27.0.   - CPAP was not effective in controlling his obstructive sleep apnea. - He did best with Bipap 18/14 cm H2O with a back up rate of 12.  He was observed in REM but not supine sleep at this pressure setting. - He did not require supplemental oxygen during this study.  DIAGNOSIS - Complex Sleep Apnea  RECOMMENDATIONS - Trial of Bipap ST 18/14 cm H2O with back up rate of 12. - He was fitted with a medium Resmed AirFit F30i full face mask. - Avoid alcohol, sedatives and other CNS depressants that may worsen sleep apnea and disrupt normal sleep architecture. - Sleep hygiene should be reviewed to assess factors that may improve sleep quality. - Weight management and regular exercise should be initiated or continued.  [Electronically signed] 09/28/2022 09:59 AM  Coralyn Helling MD, ABSM Diplomate, American Board of Sleep Medicine NPI: [id number]  [facility] PH: [phone]   FX: [phone] ACCREDITED BY THE AMERICAN ACADEMY OF SLEEP MEDICINE

## 2022-10-03 ENCOUNTER — Encounter: Payer: Self-pay | Admitting: Acute Care

## 2022-10-03 ENCOUNTER — Inpatient Hospital Stay: Payer: Medicare HMO

## 2022-10-03 ENCOUNTER — Inpatient Hospital Stay: Payer: Medicare HMO | Attending: Hematology & Oncology

## 2022-10-03 ENCOUNTER — Other Ambulatory Visit: Payer: Self-pay

## 2022-10-03 ENCOUNTER — Encounter: Payer: Self-pay | Admitting: Hematology & Oncology

## 2022-10-03 ENCOUNTER — Ambulatory Visit (INDEPENDENT_AMBULATORY_CARE_PROVIDER_SITE_OTHER): Payer: Medicare HMO | Admitting: Acute Care

## 2022-10-03 ENCOUNTER — Inpatient Hospital Stay (HOSPITAL_BASED_OUTPATIENT_CLINIC_OR_DEPARTMENT_OTHER): Payer: Medicare HMO | Admitting: Hematology & Oncology

## 2022-10-03 VITALS — BP 123/83 | HR 73 | Temp 98.1°F | Resp 19 | Ht 72.0 in | Wt 299.0 lb

## 2022-10-03 DIAGNOSIS — M1611 Unilateral primary osteoarthritis, right hip: Secondary | ICD-10-CM

## 2022-10-03 DIAGNOSIS — S83511A Sprain of anterior cruciate ligament of right knee, initial encounter: Secondary | ICD-10-CM

## 2022-10-03 DIAGNOSIS — M545 Low back pain, unspecified: Secondary | ICD-10-CM | POA: Diagnosis not present

## 2022-10-03 DIAGNOSIS — Z7982 Long term (current) use of aspirin: Secondary | ICD-10-CM | POA: Insufficient documentation

## 2022-10-03 DIAGNOSIS — Z79899 Other long term (current) drug therapy: Secondary | ICD-10-CM | POA: Diagnosis not present

## 2022-10-03 DIAGNOSIS — F17210 Nicotine dependence, cigarettes, uncomplicated: Secondary | ICD-10-CM

## 2022-10-03 DIAGNOSIS — G8929 Other chronic pain: Secondary | ICD-10-CM | POA: Insufficient documentation

## 2022-10-03 DIAGNOSIS — D45 Polycythemia vera: Secondary | ICD-10-CM | POA: Diagnosis present

## 2022-10-03 DIAGNOSIS — D751 Secondary polycythemia: Secondary | ICD-10-CM | POA: Diagnosis not present

## 2022-10-03 LAB — CBC WITH DIFFERENTIAL (CANCER CENTER ONLY)
Abs Immature Granulocytes: 0.09 10*3/uL — ABNORMAL HIGH (ref 0.00–0.07)
Basophils Absolute: 0.1 10*3/uL (ref 0.0–0.1)
Basophils Relative: 1 %
Eosinophils Absolute: 0.3 10*3/uL (ref 0.0–0.5)
Eosinophils Relative: 4 %
HCT: 42.4 % (ref 39.0–52.0)
Hemoglobin: 13.3 g/dL (ref 13.0–17.0)
Immature Granulocytes: 1 %
Lymphocytes Relative: 23 %
Lymphs Abs: 1.9 10*3/uL (ref 0.7–4.0)
MCH: 25.9 pg — ABNORMAL LOW (ref 26.0–34.0)
MCHC: 31.4 g/dL (ref 30.0–36.0)
MCV: 82.5 fL (ref 80.0–100.0)
Monocytes Absolute: 0.6 10*3/uL (ref 0.1–1.0)
Monocytes Relative: 7 %
Neutro Abs: 5.3 10*3/uL (ref 1.7–7.7)
Neutrophils Relative %: 64 %
Platelet Count: 194 10*3/uL (ref 150–400)
RBC: 5.14 MIL/uL (ref 4.22–5.81)
RDW: 14.6 % (ref 11.5–15.5)
WBC Count: 8.3 10*3/uL (ref 4.0–10.5)
nRBC: 0 % (ref 0.0–0.2)

## 2022-10-03 LAB — CMP (CANCER CENTER ONLY)
ALT: 25 U/L (ref 0–44)
AST: 17 U/L (ref 15–41)
Albumin: 3.8 g/dL (ref 3.5–5.0)
Alkaline Phosphatase: 84 U/L (ref 38–126)
Anion gap: 3 — ABNORMAL LOW (ref 5–15)
BUN: 17 mg/dL (ref 8–23)
CO2: 30 mmol/L (ref 22–32)
Calcium: 8.2 mg/dL — ABNORMAL LOW (ref 8.9–10.3)
Chloride: 103 mmol/L (ref 98–111)
Creatinine: 1.17 mg/dL (ref 0.61–1.24)
GFR, Estimated: >60 mL/min (ref >60)
Glucose, Bld: 113 mg/dL — ABNORMAL HIGH (ref 70–99)
Potassium: 4 mmol/L (ref 3.5–5.1)
Sodium: 136 mmol/L (ref 135–145)
Total Bilirubin: 0.3 mg/dL (ref 0.3–1.2)
Total Protein: 7 g/dL (ref 6.5–8.1)

## 2022-10-03 LAB — IRON AND IRON BINDING CAPACITY (CC-WL,HP ONLY)
Iron: 30 ug/dL — ABNORMAL LOW (ref 45–182)
Saturation Ratios: 6 % — ABNORMAL LOW (ref 17.9–39.5)
TIBC: 525 ug/dL — ABNORMAL HIGH (ref 250–450)
UIBC: 495 ug/dL — ABNORMAL HIGH (ref 117–376)

## 2022-10-03 LAB — RETICULOCYTES
Immature Retic Fract: 22.1 % — ABNORMAL HIGH (ref 2.3–15.9)
RBC.: 5.18 MIL/uL (ref 4.22–5.81)
Retic Count, Absolute: 75.1 10*3/uL (ref 19.0–186.0)
Retic Ct Pct: 1.5 % (ref 0.4–3.1)

## 2022-10-03 LAB — FERRITIN: Ferritin: 9 ng/mL — ABNORMAL LOW (ref 24–336)

## 2022-10-03 LAB — LACTATE DEHYDROGENASE: LDH: 153 U/L (ref 98–192)

## 2022-10-03 NOTE — Progress Notes (Signed)
Virtual Visit via Telephone Note  I connected with Jacob Bradford on 10/03/22 at 11:30 AM EDT by telephone and verified that I am speaking with the correct person using two identifiers.  Location: Patient:  At home Provider: [address], [place], [place], [address]    I discussed the limitations, risks, security and privacy concerns of performing an evaluation and management service by telephone and the availability of in person appointments. I also discussed with the patient that there may be a patient responsible charge related to this service. The patient expressed understanding and agreed to proceed.    Shared Decision Making Visit Lung Cancer Screening Program [phone])   Eligibility: Age 62 y.o. Pack Years Smoking History Calculation 90 pack year smoking history (# packs/per year x # years smoked) Recent History of coughing up blood  no Unexplained weight loss? no ( >Than 15 pounds within the last 6 months ) Prior History Lung / other cancer no (Diagnosis within the last 5 years already requiring surveillance chest CT Scans). Smoking Status Current Smoker Former Smokers: Years since quit:  NA  Quit Date:  NA  Visit Components: Discussion included one or more decision making aids. yes Discussion included risk/benefits of screening. yes Discussion included potential follow up diagnostic testing for abnormal scans. yes Discussion included meaning and risk of over diagnosis. yes Discussion included meaning and risk of False Positives. yes Discussion included meaning of total radiation exposure. yes  Counseling Included: Importance of adherence to annual lung cancer LDCT screening. yes Impact of comorbidities on ability to participate in the program. yes Ability and willingness to under diagnostic treatment. yes  Smoking Cessation Counseling: Current Smokers:  Discussed importance of smoking cessation. yes Information about tobacco cessation classes and  interventions provided to patient. yes Patient provided with "ticket" for LDCT Scan. NA Symptomatic Patient. no  Counseling NA Diagnosis Code: Tobacco Use Z72.0 Asymptomatic Patient yes  Counseling (Intermediate counseling: > three minutes counseling) V7846 Former Smokers:  Discussed the importance of maintaining cigarette abstinence. yes Diagnosis Code: Personal History of Nicotine Dependence. N62.952 Information about tobacco cessation classes and interventions provided to patient. Yes Patient provided with "ticket" for LDCT Scan. yes Written Order for Lung Cancer Screening with LDCT placed in Epic. Yes (CT Chest Lung Cancer Screening Low Dose W/O CM) [id number] Z12.2-Screening of respiratory organs Z87.891-Personal history of nicotine dependence  I have spent 25 minutes of face to face/ virtual visit   time with  Jacob Bradford discussing the risks and benefits of lung cancer screening. We viewed / discussed a power point together that explained in detail the above noted topics. We paused at intervals to allow for questions to be asked and answered to ensure understanding.We discussed that the single most powerful action that he can take to decrease his risk of developing lung cancer is to quit smoking. We discussed whether or not he is ready to commit to setting a quit date. He is interested in quitting, and is using patches to help. We discussed options for tools to aid in quitting smoking including nicotine replacement therapy, non-nicotine medications, support groups, Quit Smart classes, and behavior modification. We discussed that often times setting smaller, more achievable goals, such as eliminating 1 cigarette a day for a week and then 2 cigarettes a day for a week can be helpful in slowly decreasing the number of cigarettes smoked. This allows for a sense of accomplishment as well as providing a clinical benefit. I provided  him  with smoking  cessation  information  with contact information  for community resources, classes, free nicotine replacement therapy, and access to mobile apps, text messaging, and on-line smoking cessation help. I have also provided  him  the office contact information in the event he needs to contact me, or the screening staff. We discussed the time and location of the scan, and that either Abigail Miyamoto RN, Karlton Lemon, RN  or I will call / send a letter with the results within 24-72 hours of receiving them. The patient verbalized understanding of all of  the above and had no further questions upon leaving the office. They have my contact information in the event they have any further questions.  I spent 3-4 minutes counseling on smoking cessation and the health risks of continued tobacco abuse.  I explained to the patient that there has been a high incidence of coronary artery disease noted on these exams. I explained that this is a non-gated exam therefore degree or severity cannot be determined. This patient is on statin therapy. I have asked the patient to follow-up with their PCP regarding any incidental finding of coronary artery disease and management with diet or medication as their PCP  feels is clinically indicated. The patient verbalized understanding of the above and had no further questions upon completion of the visit.  Pt. Is actively working on quitting smoking.       Bevelyn Ngo, NP 10/03/2022
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
## 2022-10-03 NOTE — Patient Instructions (Signed)
Thank you for participating in the [facility]. It was our pleasure to meet you today. We will call you with the results of your scan within the next few days. Your scan will be assigned a Lung RADS category score by the physicians reading the scans.  This Lung RADS score determines follow up scanning.  See below for description of categories, and follow up screening recommendations. We will be in touch to schedule your follow up screening annually or based on recommendations of our providers. We will fax a copy of your scan results to your Primary Care Physician, or the physician who referred you to the program, to ensure they have the results. Please call the office if you have any questions or concerns regarding your scanning experience or results.  Our office number is [phone]. Please speak with Denise Phelps, RN. , or  Denise Buckner RN, They are  our Lung Cancer Screening RN.'s If They are unavailable when you call, Please leave a message on the voice mail. We will return your call at our earliest convenience.This voice mail is monitored several times a day.  Remember, if your scan is normal, we will scan you annually as long as you continue to meet the criteria for the program. (Age 50-80, Current smoker or smoker who has quit within the last 15 years). If you are a smoker, remember, quitting is the single most powerful action that you can take to decrease your risk of lung cancer and other pulmonary, breathing related problems. We know quitting is hard, and we are here to help.  Please let us know if there is anything we can do to help you meet your goal of quitting. If you are a former smoker, congratulations. We are proud of you! Remain smoke free! Remember you can refer friends or family members through the number above.  We will screen them to make sure they meet criteria for the program. Thank you for helping us take better care of you by  participating in Lung Screening.  You can receive free nicotine replacement therapy ( patches, gum or mints) by calling 1-800-QUIT NOW. Please call so we can get you on the path to becoming  a non-smoker. I know it is hard, but you can do this!  Lung RADS Categories:  Lung RADS 1: no nodules or definitely non-concerning nodules.  Recommendation is for a repeat annual scan in 12 months.  Lung RADS 2:  nodules that are non-concerning in appearance and behavior with a very low likelihood of becoming an active cancer. Recommendation is for a repeat annual scan in 12 months.  Lung RADS 3: nodules that are probably non-concerning , includes nodules with a low likelihood of becoming an active cancer.  Recommendation is for a 6-month repeat screening scan. Often noted after an upper respiratory illness. We will be in touch to make sure you have no questions, and to schedule your 6-month scan.  Lung RADS 4 A: nodules with concerning findings, recommendation is most often for a follow up scan in 3 months or additional testing based on our provider's assessment of the scan. We will be in touch to make sure you have no questions and to schedule the recommended 3 month follow up scan.  Lung RADS 4 B:  indicates findings that are concerning. We will be in touch with you to schedule additional diagnostic testing based on our provider's  assessment of the scan.  Other options for assistance in smoking cessation (   As covered by your insurance benefits)  Hypnosis for smoking cessation  [facility] [employer] [phone]  Acupuncture for smoking cessation  [facility] [phone]   

## 2022-10-04 ENCOUNTER — Encounter: Payer: Self-pay | Admitting: Hematology & Oncology

## 2022-10-04 ENCOUNTER — Ambulatory Visit
Admission: RE | Admit: 2022-10-04 | Discharge: 2022-10-04 | Disposition: A | Discharge status: Home / Self Care | Payer: Medicare HMO | Source: Ambulatory Visit | Attending: Acute Care | Admitting: Acute Care

## 2022-10-04 DIAGNOSIS — Z122 Encounter for screening for malignant neoplasm of respiratory organs: Secondary | ICD-10-CM

## 2022-10-04 DIAGNOSIS — F17210 Nicotine dependence, cigarettes, uncomplicated: Secondary | ICD-10-CM

## 2022-10-04 DIAGNOSIS — Z87891 Personal history of nicotine dependence: Secondary | ICD-10-CM

## 2022-10-10 ENCOUNTER — Telehealth: Payer: Self-pay | Admitting: Adult Health

## 2022-10-10 DIAGNOSIS — G4733 Obstructive sleep apnea (adult) (pediatric): Secondary | ICD-10-CM

## 2022-10-10 NOTE — Telephone Encounter (Signed)
Patient would like results of sleep test and CT scan. Patient phone number is [phone].

## 2022-10-11 NOTE — Telephone Encounter (Signed)
Patient is aware of below message/recommendations and voiced his understanding.  Nothing further needed.  

## 2022-10-11 NOTE — Telephone Encounter (Signed)
Continue on Oxygen with activity as before  Sleep study said no O2 needed with BIPAP  Stay on O2 At bedtime  until you start BIPAP .

## 2022-10-11 NOTE — Telephone Encounter (Signed)
Tammy can you please advise on Sleep Results and CT scan?

## 2022-10-11 NOTE — Telephone Encounter (Signed)
Severe complex sleep apnea occurred with an overall AHI of 122.4 and SpO2 low of 69%.  His central apnea index (CAI) was 27.0.   Needs to begin : ASAP . Set up ov in 6 weeks for follow up .   Bipap ST 18/14 cm H2O with back up rate of 12. medium Resmed AirFit F30i full face mask.  Spoke with patient and awaare    CT chest : Lung Rads 1 okay  LDCT team will call and set up 1 year follow up   Incidental findings showed some bilateral gynecomastia left greater than right needs to follow-up with primary care for further evaluation and see if any additional testing is indicated.  Also mild right paratracheal adenopathy-follow with primary care  Patient is aware and verbalized understanding and will make an appointment with his primary care provider -

## 2022-10-11 NOTE — Telephone Encounter (Signed)
Spoke to patient and verified DME company. Bipap order placed to [employer]. He is questioning if he should continue nocturnal oxygen.  Tammy, please advise. Thanks

## 2022-10-17 ENCOUNTER — Other Ambulatory Visit: Payer: Self-pay | Admitting: Hematology & Oncology

## 2022-10-17 ENCOUNTER — Other Ambulatory Visit: Payer: Self-pay

## 2022-10-17 ENCOUNTER — Telehealth: Payer: Self-pay | Admitting: Adult Health

## 2022-10-17 DIAGNOSIS — G4731 Primary central sleep apnea: Secondary | ICD-10-CM

## 2022-10-17 DIAGNOSIS — D45 Polycythemia vera: Secondary | ICD-10-CM

## 2022-10-17 NOTE — Telephone Encounter (Signed)
YEs that is fine .  °

## 2022-10-17 NOTE — Telephone Encounter (Signed)
See message from [employer] below okay to provide diagnosis of Central sleep apnea

## 2022-10-17 NOTE — Telephone Encounter (Signed)
Order has been updated. 

## 2022-10-18 ENCOUNTER — Encounter: Payer: Self-pay | Admitting: Hematology & Oncology

## 2022-10-18 MED ORDER — MORPHINE SULFATE ER 60 MG PO TBCR
60.0000 mg | EXTENDED_RELEASE_TABLET | Freq: Two times a day (BID) | ORAL | 0 refills | Status: DC
Start: 2022-10-18 — End: 2022-11-19

## 2022-10-18 MED ORDER — MORPHINE SULFATE 30 MG PO TABS: 30.0000 mg | ORAL_TABLET | Freq: Four times a day (QID) | ORAL | 0 refills | Status: DC | PRN

## 2022-10-18 NOTE — Telephone Encounter (Signed)
Received voicemail on the [facility] phone. Pt stated in the voicemail that he was suppose to get an order for a bipap and not a cpap. Pt states the "correct code to use is G4731". Pt is requesting a call back to clarify. Will forward to triage to address. Thanks!

## 2022-10-25 NOTE — Telephone Encounter (Signed)
Spoke to [place] and pt picked up Bipap this afternoon in the office.  Nothing further needed.

## 2022-10-25 NOTE — Telephone Encounter (Signed)
Order that was placed 5/15 was for BIPAP not cpap.  Routing to [facility] for assistance with this.

## 2022-10-25 NOTE — Telephone Encounter (Signed)
Order that was placed was for a Bipap so not sure why pt is calling to state he is to get a Bipap and not a Cpap.  I have called Brad at [employer] & left a vm to check status.

## 2022-11-06 ENCOUNTER — Encounter: Payer: Self-pay | Admitting: Hematology & Oncology

## 2022-11-12 NOTE — Telephone Encounter (Signed)
Patient sent this message for Sarah,NP-  What or who do I need to see about this. I checked with family Dr but no access to results.     Patient lung screening CT was 10/24/22   Message routed to lung nodule pool to advise

## 2022-11-13 ENCOUNTER — Telehealth: Payer: Self-pay | Admitting: Acute Care

## 2022-11-13 DIAGNOSIS — Z122 Encounter for screening for malignant neoplasm of respiratory organs: Secondary | ICD-10-CM

## 2022-11-13 DIAGNOSIS — F17210 Nicotine dependence, cigarettes, uncomplicated: Secondary | ICD-10-CM

## 2022-11-13 DIAGNOSIS — Z87891 Personal history of nicotine dependence: Secondary | ICD-10-CM

## 2022-11-13 NOTE — Telephone Encounter (Signed)
I have attempted to call the patient with the results of their  Low Dose CT Chest Lung cancer screening scan. There was no answer. I have left a HIPPA compliant VM requesting the patient call the office for the scan results. I included the office contact information in the message. We will await his return call. If no return call we will continue to call until patient is contacted.    Denise, LR 1, reactive paratracheal lymph node. We need to see if he has been sick. We can ask him when he returns the call. If he has been sick, this may just be a reactive lymph node.  If he has not been sick we will most likely will need to repeat scan in 6 months to eval lymphadenopathy. Thanks so much

## 2022-11-15 NOTE — Telephone Encounter (Signed)
Called and spoke to pt. Informed him of the recs per Maralyn Sago. Repeat scan has been ordered for 6 months. Nothing further needed at this time.

## 2022-11-15 NOTE — Telephone Encounter (Signed)
Called and spoke to pt. Pt states he hasn't been sick within the last 1-2 months aside from occasional chest tightness (resolves with albuterol) and intermittent sinus congestion. Patient states he thinks his congestion is from not wearing his CPAP and sleeping more. Pt denies cough with chest congestion. Pt sound like he had slight nasal congestion. Pt states he was hospitalized in 06/2022 with flu and pna. Pt states he has been sleeping more recently because he has been feeling depressed. I informed patient if he is feeling depressed and it is affecting his ADLs then he should speak to his PCP about help. Pt agreed and states he would. Regarding pts CPAP, pt states he has not been wearing it because he has been letting his beard grow and the mask isnt sealing properly. Informed pt of the importance of wearing his CPAP nightly. Pt states he will shave his beard and start wearing his CPAP again. Advised pt if he has any questions or needs help with his CPAP or is unsure of what to do about his depression then to call our office and we will help in any way possible. Patient sounded appreciative and positive when the call ended.   Will forward to Maralyn Sago to see if ok to order 6 month repeat scan. Thanks.

## 2022-11-18 ENCOUNTER — Other Ambulatory Visit: Payer: Self-pay | Admitting: Hematology & Oncology

## 2022-11-18 DIAGNOSIS — D45 Polycythemia vera: Secondary | ICD-10-CM

## 2022-11-19 ENCOUNTER — Other Ambulatory Visit: Payer: Self-pay | Admitting: Hematology & Oncology

## 2022-11-19 ENCOUNTER — Encounter: Payer: Self-pay | Admitting: Hematology & Oncology

## 2022-11-19 ENCOUNTER — Other Ambulatory Visit: Payer: Self-pay

## 2022-11-19 DIAGNOSIS — N62 Hypertrophy of breast: Secondary | ICD-10-CM

## 2022-11-19 MED ORDER — MORPHINE SULFATE 30 MG PO TABS: 30.0000 mg | ORAL_TABLET | Freq: Four times a day (QID) | ORAL | 0 refills | Status: DC | PRN

## 2022-11-19 NOTE — Telephone Encounter (Signed)
Last refilled both Morphines on 10/18/22 for a 1 month supply. Please advise for refills, thanks!

## 2022-11-20 ENCOUNTER — Other Ambulatory Visit: Payer: Self-pay

## 2022-11-20 DIAGNOSIS — M47817 Spondylosis without myelopathy or radiculopathy, lumbosacral region: Secondary | ICD-10-CM

## 2022-11-20 MED ORDER — MORPHINE SULFATE 30 MG PO TABS
30.0000 mg | ORAL_TABLET | ORAL | 0 refills | Status: DC | PRN
Start: 2022-11-20 — End: 2022-12-18

## 2022-11-20 NOTE — Telephone Encounter (Signed)
Pharmacy called and stated the quantity needed to be updated according to the frequency of the 30 mg tablet. Per Dr Myna Hidalgo the 30 mg is going to change to every 4 hours as needed. The Morphine 60 mg is not working per pt and we will discontinue.

## 2022-12-18 ENCOUNTER — Other Ambulatory Visit: Payer: Self-pay | Admitting: Hematology & Oncology

## 2022-12-18 DIAGNOSIS — M47817 Spondylosis without myelopathy or radiculopathy, lumbosacral region: Secondary | ICD-10-CM

## 2022-12-18 MED ORDER — MORPHINE SULFATE 30 MG PO TABS
30.0000 mg | ORAL_TABLET | ORAL | 0 refills | Status: DC | PRN
Start: 2022-12-18 — End: 2023-01-19

## 2022-12-21 ENCOUNTER — Other Ambulatory Visit: Payer: Self-pay | Admitting: Hematology & Oncology

## 2022-12-21 DIAGNOSIS — N62 Hypertrophy of breast: Secondary | ICD-10-CM

## 2022-12-27 ENCOUNTER — Encounter: Payer: Self-pay | Admitting: Hematology & Oncology

## 2023-01-03 ENCOUNTER — Inpatient Hospital Stay (HOSPITAL_BASED_OUTPATIENT_CLINIC_OR_DEPARTMENT_OTHER): Payer: Medicare PPO | Admitting: Hematology & Oncology

## 2023-01-03 ENCOUNTER — Other Ambulatory Visit: Payer: Self-pay

## 2023-01-03 ENCOUNTER — Inpatient Hospital Stay: Payer: Medicare PPO | Attending: Hematology & Oncology

## 2023-01-03 ENCOUNTER — Inpatient Hospital Stay: Payer: Medicare PPO

## 2023-01-03 ENCOUNTER — Encounter: Payer: Self-pay | Admitting: Hematology & Oncology

## 2023-01-03 VITALS — BP 150/94 | HR 80 | Temp 98.9°F | Resp 20 | Ht 72.0 in | Wt 275.0 lb

## 2023-01-03 DIAGNOSIS — F17210 Nicotine dependence, cigarettes, uncomplicated: Secondary | ICD-10-CM | POA: Diagnosis not present

## 2023-01-03 DIAGNOSIS — S83511A Sprain of anterior cruciate ligament of right knee, initial encounter: Secondary | ICD-10-CM

## 2023-01-03 DIAGNOSIS — D45 Polycythemia vera: Secondary | ICD-10-CM

## 2023-01-03 DIAGNOSIS — M1611 Unilateral primary osteoarthritis, right hip: Secondary | ICD-10-CM

## 2023-01-03 DIAGNOSIS — M5136 Other intervertebral disc degeneration, lumbar region: Secondary | ICD-10-CM | POA: Insufficient documentation

## 2023-01-03 DIAGNOSIS — Z79899 Other long term (current) drug therapy: Secondary | ICD-10-CM | POA: Diagnosis not present

## 2023-01-03 DIAGNOSIS — G8929 Other chronic pain: Secondary | ICD-10-CM | POA: Diagnosis not present

## 2023-01-03 DIAGNOSIS — D751 Secondary polycythemia: Secondary | ICD-10-CM | POA: Insufficient documentation

## 2023-01-03 DIAGNOSIS — Z7982 Long term (current) use of aspirin: Secondary | ICD-10-CM | POA: Diagnosis not present

## 2023-01-03 LAB — CBC WITH DIFFERENTIAL (CANCER CENTER ONLY)
Abs Immature Granulocytes: 0.11 10*3/uL — ABNORMAL HIGH (ref 0.00–0.07)
Basophils Absolute: 0.1 10*3/uL (ref 0.0–0.1)
Basophils Relative: 1 %
Eosinophils Absolute: 0.2 10*3/uL (ref 0.0–0.5)
Eosinophils Relative: 2 %
HCT: 48.4 % (ref 39.0–52.0)
Hemoglobin: 15.2 g/dL (ref 13.0–17.0)
Immature Granulocytes: 1 %
Lymphocytes Relative: 19 %
Lymphs Abs: 1.8 10*3/uL (ref 0.7–4.0)
MCH: 26.7 pg (ref 26.0–34.0)
MCHC: 31.4 g/dL (ref 30.0–36.0)
MCV: 84.9 fL (ref 80.0–100.0)
Monocytes Absolute: 0.6 10*3/uL (ref 0.1–1.0)
Monocytes Relative: 7 %
Neutro Abs: 6.7 10*3/uL (ref 1.7–7.7)
Neutrophils Relative %: 70 %
Platelet Count: 219 10*3/uL (ref 150–400)
RBC: 5.7 MIL/uL (ref 4.22–5.81)
RDW: 14.8 % (ref 11.5–15.5)
WBC Count: 9.5 10*3/uL (ref 4.0–10.5)
nRBC: 0 % (ref 0.0–0.2)

## 2023-01-03 LAB — CMP (CANCER CENTER ONLY)
ALT: 37 U/L (ref 0–44)
AST: 23 U/L (ref 15–41)
Albumin: 4.2 g/dL (ref 3.5–5.0)
Alkaline Phosphatase: 97 U/L (ref 38–126)
Anion gap: 11 (ref 5–15)
BUN: 13 mg/dL (ref 8–23)
CO2: 29 mmol/L (ref 22–32)
Calcium: 9.4 mg/dL (ref 8.9–10.3)
Chloride: 103 mmol/L (ref 98–111)
Creatinine: 1.2 mg/dL (ref 0.61–1.24)
GFR, Estimated: >60 mL/min (ref >60)
Glucose, Bld: 158 mg/dL — ABNORMAL HIGH (ref 70–99)
Potassium: 4.2 mmol/L (ref 3.5–5.1)
Sodium: 143 mmol/L (ref 135–145)
Total Bilirubin: 0.8 mg/dL (ref 0.3–1.2)
Total Protein: 7.8 g/dL (ref 6.5–8.1)

## 2023-01-03 LAB — RETICULOCYTES
Immature Retic Fract: 9.4 % (ref 2.3–15.9)
RBC.: 5.71 MIL/uL (ref 4.22–5.81)
Retic Count, Absolute: 67.9 10*3/uL (ref 19.0–186.0)
Retic Ct Pct: 1.2 % (ref 0.4–3.1)

## 2023-01-03 LAB — FERRITIN: Ferritin: 27 ng/mL (ref 24–336)

## 2023-01-03 LAB — IRON AND IRON BINDING CAPACITY (CC-WL,HP ONLY)
Iron: 113 ug/dL (ref 45–182)
Saturation Ratios: 23 % (ref 17.9–39.5)
TIBC: 494 ug/dL — ABNORMAL HIGH (ref 250–450)
UIBC: 381 ug/dL — ABNORMAL HIGH (ref 117–376)

## 2023-01-03 NOTE — Progress Notes (Signed)
Hematology and Oncology Follow Up Visit  HUNTER TRINGALE [id number] November 15, 1960 62 y.o. 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 Sauvageau is here today for follow-up.  He is having a lot of problems with his right hip.  This is deteriorating more.  His Orthopedist wants him to lose a little bit more weight before he does surgery.  Mr. Gayheart is already lost 25 pounds since we last saw him.  Again, he is having quite a bit of problems with the pain.  He is doing okay with breathing.  He is still smoking but cutting back quite a bit.  He has had no problems with increased cough.  There is no shortness of breath.  He does have a CPAP machine at home but was not instructed how to use this.  He will try to use this and figure this out on his own.  He has had good appetite.  Again he is try to cut back on his portions.  It is hard for him to exercise because of the pain in the right hip.  He has had no issues with bleeding.  There is been no change in bowel or bladder habits.  Overall, I would have said that his performance status is probably ECOG 1.          Medications:  Allergies as of 01/03/2023   No Known Allergies      Medication List        Accurate as of January 03, 2023 12:52 PM. If you have any questions, ask your nurse or doctor.          acetaminophen 500 MG tablet Commonly known as: TYLENOL Take 1,000 mg by mouth every 6 (six) hours as needed for moderate pain.   albuterol 108 (90 Base) MCG/ACT inhaler Commonly known as: VENTOLIN HFA Inhale 2 puffs into the lungs every 6 (six) hours as needed for wheezing or shortness of breath.   albuterol (2.5 MG/3ML)  0.083% nebulizer solution Commonly known as: PROVENTIL SMARTSIG:3 Milliliter(s) Via Nebulizer 3 Times Daily PRN   ALPRAZolam 1 MG tablet Commonly known as: XANAX Take 1 tablet (1 mg total) by mouth 3 (three) times daily as needed for anxiety.   aspirin 81 MG tablet Take 1 tablet (81 mg total) by mouth daily.   atorvastatin 20 MG tablet Commonly known as: LIPITOR Take 20 mg by mouth daily.   calcium carbonate 500 MG chewable tablet Commonly known as: TUMS - dosed in mg elemental calcium Chew 1 tablet by mouth 2 (two) times daily as needed for indigestion.   esomeprazole 40 MG capsule Commonly known as: NEXIUM Take 40 mg by mouth daily at 12 noon.   furosemide 20 MG tablet Commonly known as: LASIX Take 20 mg by mouth daily as needed for edema or fluid.   Goodys Extra Strength [id number] MG Pack Generic drug: Aspirin-Acetaminophen-Caffeine Take 1 packet by mouth 3 (three) times daily as needed (pain).   losartan-hydrochlorothiazide 100-12.5 MG tablet Commonly known as: HYZAAR Take 1 tablet by mouth daily.   morphine 30 MG tablet Commonly known as: MSIR Take 1 tablet (30 mg total) by mouth every 4 (four) hours as needed.  naloxone 4 MG/0.1ML Liqd nasal spray kit Commonly known as: NARCAN   polyethylene glycol 17 g packet Commonly known as: MIRALAX / GLYCOLAX Take 17 g by mouth daily as needed for moderate constipation.   Spiriva Respimat 2.5 MCG/ACT Aers Generic drug: Tiotropium Bromide Monohydrate Inhale 2 puffs into the lungs daily.        Allergies:  No Known Allergies  Past Medical History, Surgical history, Social history, and Family History were reviewed and updated.  Review of Systems: Review of Systems  Constitutional: Negative.   HENT: Negative.    Eyes: Negative.   Respiratory: Negative.    Cardiovascular: Negative.   Gastrointestinal: Negative.   Genitourinary: Negative.   Musculoskeletal:  Positive for back pain and neck pain.  Skin:  Negative.   Neurological: Negative.   Endo/Heme/Allergies: Negative.   Psychiatric/Behavioral: Negative.       Physical Exam:  height is 6' (1.829 m) and weight is 275 lb (124.7 kg). His oral temperature is 98.9 F (37.2 C). His blood pressure is 150/94 (abnormal) and his pulse is 80. His respiration is 20.   Wt Readings from Last 3 Encounters:  01/03/23 275 lb (124.7 kg)  10/03/22 299 lb (135.6 kg)  09/26/22 290 lb (131.5 kg)    Physical Exam Vitals reviewed.  HENT:     Head: Normocephalic and atraumatic.  Eyes:     Pupils: Pupils are equal, round, and reactive to light.  Cardiovascular:     Rate and Rhythm: Normal rate and regular rhythm.     Heart sounds: Normal heart sounds.  Pulmonary:     Effort: Pulmonary effort is normal.     Breath sounds: Normal breath sounds.  Abdominal:     General: Bowel sounds are normal.     Palpations: Abdomen is soft.  Musculoskeletal:        General: No tenderness or deformity. Normal range of motion.     Cervical back: Normal range of motion.     Comments: He has significant pain to palpation and range of motion of the right hip.  Lymphadenopathy:     Cervical: No cervical adenopathy.  Skin:    General: Skin is warm and dry.     Findings: No erythema or rash.  Neurological:     Mental Status: He is alert and oriented to person, place, and time.  Psychiatric:        Behavior: Behavior normal.        Thought Content: Thought content normal.        Judgment: Judgment normal.      Lab Results  Component Value Date   WBC 9.5 01/03/2023   HGB 15.2 01/03/2023   HCT 48.4 01/03/2023   MCV 84.9 01/03/2023   PLT 219 01/03/2023   Lab Results  Component Value Date   FERRITIN 9 (L) 10/03/2022   IRON 30 (L) 10/03/2022   TIBC 525 (H) 10/03/2022   UIBC 495 (H) 10/03/2022   IRONPCTSAT 6 (L) 10/03/2022   Lab Results  Component Value Date   RETICCTPCT 1.2 01/03/2023   RBC 5.70 01/03/2023   RBC 5.71 01/03/2023   RETICCTABS 77.0  02/22/2011   No results found for: "KPAFRELGTCHN", "LAMBDASER", "KAPLAMBRATIO" No results found for: "IGGSERUM", "IGA", "IGMSERUM" No results found for: "TOTALPROTELP", "ALBUMINELP", "A1GS", "A2GS", "BETS", "BETA2SER", "GAMS", "MSPIKE", "SPEI"   Chemistry      Component Value Date/Time   NA 143 01/03/2023 1200   NA 141 05/09/2017 0910   K 4.2 01/03/2023 1200  K 4.2 05/09/2017 0910   CL 103 01/03/2023 1200   CL 105 11/13/2016 1123   CO2 29 01/03/2023 1200   CO2 24 05/09/2017 0910   BUN 13 01/03/2023 1200   BUN 15.2 05/09/2017 0910   CREATININE 1.20 01/03/2023 1200   CREATININE 1.2 05/09/2017 0910      Component Value Date/Time   CALCIUM 9.4 01/03/2023 1200   CALCIUM 9.1 05/09/2017 0910   ALKPHOS 97 01/03/2023 1200   ALKPHOS 96 05/09/2017 0910   AST 23 01/03/2023 1200   AST 22 05/09/2017 0910   ALT 37 01/03/2023 1200   ALT 30 05/09/2017 0910   BILITOT 0.8 01/03/2023 1200   BILITOT 0.48 05/09/2017 0910      Impression and Plan: Mr. Detoro is a very pleasant 62 yo caucasian gentleman with secondary polycythemia.  He needs to be phlebotomized today.  Unfortunately, his real problem is the right hip.  He deftly needs to have this fixed.  Hopefully, he will be able to get this fixed.  I am unsure when he sees his Orthopedist.  I know that he has lost weight.  I told him that the orthopedist could certainly operate on him.  I know he would like to have some more weight off.  This is hard for the judgment to do.  Again, stopping smoking will help him out.  I know he is cut back.  I think we does use the CPAP machine, this will also help.  We will clearly have to follow him a little more closely.  I will have him come back to see [place] in about a month or so.  Josph Macho, MD 8/1/202412:52 PM

## 2023-01-03 NOTE — Progress Notes (Signed)
Pt. arrived for therapeutic phlebotomy. Saw Dr. Myna Hidalgo today.  Attempted 6 times with blood return but unable to phlebotomize today. Pt. Rescheduled for Wednesday. Educated port a cath, pt. Strongly declined.  Pt. Encouraged to increase fluid intake before procedure. No symptoms upon discharged.

## 2023-01-03 NOTE — Patient Instructions (Signed)
Pt. Encouraged to increase fluids intake pre phlebotomy procedure.

## 2023-01-08 ENCOUNTER — Ambulatory Visit
Admission: RE | Admit: 2023-01-08 | Discharge: 2023-01-08 | Disposition: A | Discharge status: Home / Self Care | Payer: Medicare PPO | Source: Ambulatory Visit | Attending: Hematology & Oncology | Admitting: Hematology & Oncology

## 2023-01-08 ENCOUNTER — Ambulatory Visit: Payer: Medicare HMO

## 2023-01-08 DIAGNOSIS — N62 Hypertrophy of breast: Secondary | ICD-10-CM

## 2023-01-10 ENCOUNTER — Inpatient Hospital Stay: Payer: Medicare PPO

## 2023-01-10 VITALS — BP 156/96 | HR 87 | Temp 98.4°F | Resp 20

## 2023-01-10 DIAGNOSIS — D45 Polycythemia vera: Secondary | ICD-10-CM

## 2023-01-10 MED ORDER — MORPHINE SULFATE (PF) 4 MG/ML IV SOLN: 4.0000 mg | Freq: Once | INTRAVENOUS | Status: DC

## 2023-01-10 MED ORDER — KETOROLAC TROMETHAMINE 15 MG/ML IJ SOLN: 30.0000 mg | Freq: Once | INTRAMUSCULAR | Status: DC

## 2023-01-10 NOTE — Patient Instructions (Signed)

## 2023-01-10 NOTE — Progress Notes (Signed)
Jacob Bradford presents today for phlebotomy per MD orders. Phlebotomy procedure started at 1201 and ended at 1207. 550 grams removed from rt [facility] using 16g phlebotomy kit by MMorris, RN Patient observed for 30 minutes after procedure without any incident. Patient tolerated procedure well. IV needle removed intact.

## 2023-01-16 ENCOUNTER — Other Ambulatory Visit: Payer: Self-pay | Admitting: Hematology & Oncology

## 2023-01-16 DIAGNOSIS — M47817 Spondylosis without myelopathy or radiculopathy, lumbosacral region: Secondary | ICD-10-CM

## 2023-01-18 ENCOUNTER — Other Ambulatory Visit: Payer: Self-pay | Admitting: Hematology & Oncology

## 2023-01-18 DIAGNOSIS — M47817 Spondylosis without myelopathy or radiculopathy, lumbosacral region: Secondary | ICD-10-CM

## 2023-01-19 ENCOUNTER — Encounter: Payer: Self-pay | Admitting: Hematology & Oncology

## 2023-01-22 ENCOUNTER — Encounter: Payer: Self-pay | Admitting: Hematology & Oncology

## 2023-02-06 ENCOUNTER — Encounter: Payer: Self-pay | Admitting: Pulmonary Disease

## 2023-02-06 ENCOUNTER — Ambulatory Visit (INDEPENDENT_AMBULATORY_CARE_PROVIDER_SITE_OTHER): Payer: Medicare PPO | Admitting: Pulmonary Disease

## 2023-02-06 VITALS — BP 130/74 | HR 88 | Ht 72.0 in | Wt 279.0 lb

## 2023-02-06 DIAGNOSIS — G4733 Obstructive sleep apnea (adult) (pediatric): Secondary | ICD-10-CM | POA: Diagnosis not present

## 2023-02-06 DIAGNOSIS — G4731 Primary central sleep apnea: Secondary | ICD-10-CM | POA: Diagnosis not present

## 2023-02-06 DIAGNOSIS — F17210 Nicotine dependence, cigarettes, uncomplicated: Secondary | ICD-10-CM | POA: Diagnosis not present

## 2023-02-06 DIAGNOSIS — J9611 Chronic respiratory failure with hypoxia: Secondary | ICD-10-CM

## 2023-02-06 NOTE — Patient Instructions (Signed)
I will see you 3 months from here  Try and get used to using CPAP on a nightly basis  Continue to work on quitting smoking  Weight loss efforts as tolerated  Continue using your inhalers  Call us with significant concerns

## 2023-02-06 NOTE — Progress Notes (Signed)
Jacob Bradford    [id number]    Oct 26, 1960  Primary Care Physician:Morton, Joelene Millin, NP  Referring Physician: Elie Confer, NP [address] [place],  [place] [address]  Chief complaint:   Patient in for initial evaluation  HPI:  History of complex sleep apnea Trying to get used to BiPAP ST  History of obstructive lung disease  Recent death in the family Stress leading to going back to smoking a pack a day  He does have a history of polycythemia  Snoring, daytime sleepiness  Recent hospitalization in January 2024 for pneumonia and influenza  Chronic pain syndrome on narcotics, anxiety for which he is on Xanax Outpatient Encounter Medications as of 02/06/2023  Medication Sig   acetaminophen (TYLENOL) 500 MG tablet Take 1,000 mg by mouth every 6 (six) hours as needed for moderate pain.   albuterol (PROVENTIL) (2.5 MG/3ML) 0.083% nebulizer solution SMARTSIG:3 Milliliter(s) Via Nebulizer 3 Times Daily PRN   albuterol (VENTOLIN HFA) 108 (90 Base) MCG/ACT inhaler Inhale 2 puffs into the lungs every 6 (six) hours as needed for wheezing or shortness of breath.   ALPRAZolam (XANAX) 1 MG tablet Take 1 tablet (1 mg total) by mouth 3 (three) times daily as needed for anxiety.   aspirin 81 MG tablet Take 1 tablet (81 mg total) by mouth daily.   Aspirin-Acetaminophen-Caffeine (GOODYS EXTRA STRENGTH) 500-325-65 MG PACK Take 1 packet by mouth 3 (three) times daily as needed (pain).   calcium carbonate (TUMS - DOSED IN MG ELEMENTAL CALCIUM) 500 MG chewable tablet Chew 1 tablet by mouth 2 (two) times daily as needed for indigestion.   esomeprazole (NEXIUM) 40 MG capsule Take 40 mg by mouth daily at 12 noon.   furosemide (LASIX) 20 MG tablet Take 20 mg by mouth daily as needed for edema or fluid.   losartan-hydrochlorothiazide (HYZAAR) 100-12.5 MG tablet Take 1 tablet by mouth daily.   morphine (MSIR) 30 MG tablet Take 1 tablet (30 mg total) by mouth every 4 (four)  hours as needed for severe pain.   naloxone (NARCAN) nasal spray 4 mg/0.1 mL    polyethylene glycol (MIRALAX / GLYCOLAX) packet Take 17 g by mouth daily as needed for moderate constipation.   Tiotropium Bromide Monohydrate (SPIRIVA RESPIMAT) 2.5 MCG/ACT AERS Inhale 2 puffs into the lungs daily.   atorvastatin (LIPITOR) 20 MG tablet Take 20 mg by mouth daily. (Patient not taking: Reported on 06/22/2022)   No facility-administered encounter medications on file as of 02/06/2023.    Allergies as of 02/06/2023   (No Known Allergies)    Past Medical History:  Diagnosis Date   Anxiety    Arthritis    Depression    GERD (gastroesophageal reflux disease)    Hyperlipidemia    Hypertension    Lunotriquetral ligament tear 10/11/2014   Polycythemia vera (HCC)    Polycythemia vera (HCC)    Spinal stenosis    Tear of anterior cruciate ligament of knee 10/11/2014    Past Surgical History:  Procedure Laterality Date   COLONOSCOPY     age 28- 3 benign polyps per pt    MAXIMUM ACCESS (MAS)POSTERIOR LUMBAR INTERBODY FUSION (PLIF) 2 LEVEL N/A 03/07/2016   Procedure: LUMBAR FOUR-FIVE,LUMBAR FIVE-SACRAL ONE MAXIMUM ACCESS (MAS) POSTERIOR LUMBAR INTERBODY FUSION (PLIF) 2 LEVEL;  Surgeon: Tia Alert, MD;  Location: [facility] OR;  Service: Neurosurgery;  Laterality: N/A;   TONSILLECTOMY     and Adenoid  Family History  Problem Relation Age of Onset   Colon cancer Neg Hx    Colon polyps Neg Hx    Esophageal cancer Neg Hx    Rectal cancer Neg Hx    Stomach cancer Neg Hx     Social History   Socioeconomic History   Marital status: Widowed    Spouse name: Not on file   Number of children: Not on file   Years of education: Not on file   Highest education level: Not on file  Occupational History   Not on file  Tobacco Use   Smoking status: Every Day    Current packs/day: 0.00    Average packs/day: 0.5 packs/day for 45.0 years (22.5 ttl pk-yrs)    Types: Cigarettes    Start date: 07/14/1979    Last  attempt to quit: 07/02/2021    Years since quitting: 1.6   Smokeless tobacco: Never   Tobacco comments:    Patient smoking a pack a day .   Vaping Use   Vaping status: Former  Substance and Sexual Activity   Alcohol use: Yes    Alcohol/week: 0.0 standard drinks of alcohol    Comment: 2-3  drinks a month if this- rare    Drug use: No   Sexual activity: Yes  Other Topics Concern   Not on file  Social History Narrative   Not on file   Social Determinants of Health   Financial Resource Strain: Not on file  Food Insecurity: Not on file  Transportation Needs: Not on file  Physical Activity: Not on file  Stress: Not on file  Social Connections: Not on file  Intimate Partner Violence: Not on file    Review of Systems  Constitutional:  Positive for fatigue.  Respiratory:  Positive for apnea and shortness of breath.   Psychiatric/Behavioral:  Positive for sleep disturbance.     Vitals:   02/06/23 1425  BP: 130/74  Pulse: 88  SpO2: 95%     Physical Exam Constitutional:      Appearance: He is obese.  HENT:     Head: Normocephalic.     Nose: No congestion.     Mouth/Throat:     Pharynx: No oropharyngeal exudate.  Eyes:     General: No scleral icterus. Cardiovascular:     Rate and Rhythm: Normal rate and regular rhythm.     Heart sounds: No murmur heard.    No friction rub.  Pulmonary:     Effort: No respiratory distress.     Breath sounds: No stridor. No wheezing or rhonchi.  Musculoskeletal:     Cervical back: No rigidity.  Neurological:     Mental Status: He is alert.  Psychiatric:        Mood and Affect: Mood normal.    Data Reviewed: Recent sleep study reviewed showing complex sleep apnea  Titrated to BiPAP ST  Compliance data reviewed showing suboptimal compliance which patient attributes to poor mask fit and recent stressors  Pulmonary function test with moderate obstruction in May 2024  Assessment:  Complex sleep apnea -The importance of  treating this was discussed with the patient -Cardiovascular and neurological risks with not treating this discussed with the patient  Obstructive lung disease -Encouraged to continue inhalers  Class II obesity -Weight loss efforts encouraged  Active smoker -Smoking cessation counseling provided  Plan/Recommendations: Encouraged to get back to using BiPAP ST on a regular basis once he gets his mask and headgear fixed  Continue bronchodilator treatments  Smoking cessation   Follow-up in 3 months  Encouraged to call with significant concerns   Virl Diamond MD [facility] 02/06/2023, 2:43 PM  CC: Elie Confer, NP

## 2023-02-14 ENCOUNTER — Inpatient Hospital Stay: Payer: Medicare PPO

## 2023-02-14 ENCOUNTER — Inpatient Hospital Stay: Payer: Medicare PPO | Admitting: Hematology & Oncology

## 2023-02-14 ENCOUNTER — Other Ambulatory Visit: Payer: Self-pay

## 2023-02-14 ENCOUNTER — Encounter: Payer: Self-pay | Admitting: Hematology & Oncology

## 2023-02-14 ENCOUNTER — Other Ambulatory Visit: Payer: Self-pay | Admitting: Medical Oncology

## 2023-02-14 ENCOUNTER — Inpatient Hospital Stay: Payer: Medicare PPO | Attending: Hematology & Oncology

## 2023-02-14 VITALS — BP 127/63 | HR 70 | Temp 97.9°F | Resp 19 | Ht 72.0 in | Wt 282.0 lb

## 2023-02-14 DIAGNOSIS — D45 Polycythemia vera: Secondary | ICD-10-CM | POA: Diagnosis present

## 2023-02-14 DIAGNOSIS — M47817 Spondylosis without myelopathy or radiculopathy, lumbosacral region: Secondary | ICD-10-CM

## 2023-02-14 LAB — IRON AND IRON BINDING CAPACITY (CC-WL,HP ONLY)
Iron: 93 ug/dL (ref 45–182)
Saturation Ratios: 18 % (ref 17.9–39.5)
TIBC: 508 ug/dL — ABNORMAL HIGH (ref 250–450)
UIBC: 415 ug/dL — ABNORMAL HIGH (ref 117–376)

## 2023-02-14 LAB — CMP (CANCER CENTER ONLY)
ALT: 16 U/L (ref 0–44)
AST: 14 U/L — ABNORMAL LOW (ref 15–41)
Albumin: 3.9 g/dL (ref 3.5–5.0)
Alkaline Phosphatase: 90 U/L (ref 38–126)
Anion gap: 7 (ref 5–15)
BUN: 12 mg/dL (ref 8–23)
CO2: 30 mmol/L (ref 22–32)
Calcium: 8.5 mg/dL — ABNORMAL LOW (ref 8.9–10.3)
Chloride: 102 mmol/L (ref 98–111)
Creatinine: 1.21 mg/dL (ref 0.61–1.24)
GFR, Estimated: >60 mL/min (ref >60)
Glucose, Bld: 170 mg/dL — ABNORMAL HIGH (ref 70–99)
Potassium: 4.3 mmol/L (ref 3.5–5.1)
Sodium: 139 mmol/L (ref 135–145)
Total Bilirubin: 0.6 mg/dL (ref 0.3–1.2)
Total Protein: 6.8 g/dL (ref 6.5–8.1)

## 2023-02-14 LAB — CBC WITH DIFFERENTIAL (CANCER CENTER ONLY)
Abs Immature Granulocytes: 0.06 10*3/uL (ref 0.00–0.07)
Basophils Absolute: 0.1 10*3/uL (ref 0.0–0.1)
Basophils Relative: 1 %
Eosinophils Absolute: 0.2 10*3/uL (ref 0.0–0.5)
Eosinophils Relative: 2 %
HCT: 44.8 % (ref 39.0–52.0)
Hemoglobin: 14.4 g/dL (ref 13.0–17.0)
Immature Granulocytes: 1 %
Lymphocytes Relative: 20 %
Lymphs Abs: 2 10*3/uL (ref 0.7–4.0)
MCH: 27.3 pg (ref 26.0–34.0)
MCHC: 32.1 g/dL (ref 30.0–36.0)
MCV: 84.8 fL (ref 80.0–100.0)
Monocytes Absolute: 0.6 10*3/uL (ref 0.1–1.0)
Monocytes Relative: 6 %
Neutro Abs: 7 10*3/uL (ref 1.7–7.7)
Neutrophils Relative %: 70 %
Platelet Count: 234 10*3/uL (ref 150–400)
RBC: 5.28 MIL/uL (ref 4.22–5.81)
RDW: 14.2 % (ref 11.5–15.5)
WBC Count: 9.9 10*3/uL (ref 4.0–10.5)
nRBC: 0 % (ref 0.0–0.2)

## 2023-02-14 LAB — FERRITIN: Ferritin: 21 ng/mL — ABNORMAL LOW (ref 24–336)

## 2023-02-14 LAB — RETICULOCYTES
Immature Retic Fract: 27.2 % — ABNORMAL HIGH (ref 2.3–15.9)
RBC.: 5.3 MIL/uL (ref 4.22–5.81)
Retic Count, Absolute: 92.8 10*3/uL (ref 19.0–186.0)
Retic Ct Pct: 1.8 % (ref 0.4–3.1)

## 2023-02-14 MED ORDER — MORPHINE SULFATE 30 MG PO TABS: 30.0000 mg | ORAL_TABLET | ORAL | 0 refills | Status: DC | PRN

## 2023-02-14 NOTE — Progress Notes (Signed)
Hematology and Oncology Follow Up Visit  Jacob Bradford [id number] [birth year] 62 y.o. 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  He is still having a lot of problems with his right hip.  It sounds like he is lost the weight that he needs in order to have surgery.  Apparently, he may have little bit of a fungal infection in the inguinal crease.  Unfortunately, his father passed away recently.  There has been a lot of issues ongoing with his step family.  Hopefully, he will be able to manage all of the agreed.  He has had no problems with fever.  He has had no increased cough or shortness of breath.  He still smokes.  Again, he is trying to cut out on this.  He does use his CPAP.Marland Kitchen  He has had no obvious change in bowel or bladder habits.  He has had no bleeding.  His last iron studies that were done back in August showed a ferritin of 27 with iron saturation 23%.  Currently, I would say that his performance status is probably ECOG 1.     Medications:  Allergies as of 02/14/2023   No Known Allergies      Medication List        Accurate as of February 14, 2023  1:06 PM. If you have any questions, ask your nurse or doctor.          acetaminophen 500 MG tablet Commonly known as: TYLENOL Take 1,000 mg by mouth every 6 (six) hours as needed for moderate pain.   albuterol 108 (90 Base) MCG/ACT inhaler Commonly known as: VENTOLIN HFA Inhale 2 puffs into the lungs every 6 (six) hours as needed for wheezing or shortness of breath.   albuterol (2.5 MG/3ML) 0.083% nebulizer solution Commonly known as: PROVENTIL SMARTSIG:3  Milliliter(s) Via Nebulizer 3 Times Daily PRN   ALPRAZolam 1 MG tablet Commonly known as: XANAX Take 1 tablet (1 mg total) by mouth 3 (three) times daily as needed for anxiety.   aspirin 81 MG tablet Take 1 tablet (81 mg total) by mouth daily.   atorvastatin 20 MG tablet Commonly known as: LIPITOR Take 20 mg by mouth daily.   calcium carbonate 500 MG chewable tablet Commonly known as: TUMS - dosed in mg elemental calcium Chew 1 tablet by mouth 2 (two) times daily as needed for indigestion.   esomeprazole 40 MG capsule Commonly known as: NEXIUM Take 40 mg by mouth daily at 12 noon.   furosemide 20 MG tablet Commonly known as: LASIX Take 20 mg by mouth daily as needed for edema or fluid.   Goodys Extra Strength [id number] MG Pack Generic drug: Aspirin-Acetaminophen-Caffeine Take 1 packet by mouth 3 (three) times daily as needed (pain).   losartan-hydrochlorothiazide 100-12.5 MG tablet Commonly known as: HYZAAR Take 1 tablet by mouth daily.   morphine 30 MG tablet Commonly known as: MSIR Take 1 tablet (30 mg total) by mouth every 4 (four) hours as needed for severe pain.   naloxone 4 MG/0.1ML Liqd nasal spray kit Commonly known as: NARCAN   polyethylene glycol 17 g packet Commonly known as: MIRALAX /  GLYCOLAX Take 17 g by mouth daily as needed for moderate constipation.   Spiriva Respimat 2.5 MCG/ACT Aers Generic drug: Tiotropium Bromide Monohydrate Inhale 2 puffs into the lungs daily.        Allergies:  No Known Allergies  Past Medical History, Surgical history, Social history, and Family History were reviewed and updated.  Review of Systems: Review of Systems  Constitutional: Negative.   HENT: Negative.    Eyes: Negative.   Respiratory: Negative.    Cardiovascular: Negative.   Gastrointestinal: Negative.   Genitourinary: Negative.   Musculoskeletal:  Positive for back pain and neck pain.  Skin: Negative.   Neurological: Negative.    Endo/Heme/Allergies: Negative.   Psychiatric/Behavioral: Negative.       Physical Exam:  height is 6' (1.829 m) and weight is 282 lb (127.9 kg). His oral temperature is 97.9 F (36.6 C). His blood pressure is 127/63 and his pulse is 70. His respiration is 19 and oxygen saturation is 96%.   Wt Readings from Last 3 Encounters:  02/14/23 282 lb (127.9 kg)  02/06/23 279 lb (126.6 kg)  01/03/23 275 lb (124.7 kg)    Physical Exam Vitals reviewed.  HENT:     Head: Normocephalic and atraumatic.  Eyes:     Pupils: Pupils are equal, round, and reactive to light.  Cardiovascular:     Rate and Rhythm: Normal rate and regular rhythm.     Heart sounds: Normal heart sounds.  Pulmonary:     Effort: Pulmonary effort is normal.     Breath sounds: Normal breath sounds.  Abdominal:     General: Bowel sounds are normal.     Palpations: Abdomen is soft.  Musculoskeletal:        General: No tenderness or deformity. Normal range of motion.     Cervical back: Normal range of motion.     Comments: He has significant pain to palpation and range of motion of the right hip.  Lymphadenopathy:     Cervical: No cervical adenopathy.  Skin:    General: Skin is warm and dry.     Findings: No erythema or rash.  Neurological:     Mental Status: He is alert and oriented to person, place, and time.  Psychiatric:        Behavior: Behavior normal.        Thought Content: Thought content normal.        Judgment: Judgment normal.     Lab Results  Component Value Date   WBC 9.9 02/14/2023   HGB 14.4 02/14/2023   HCT 44.8 02/14/2023   MCV 84.8 02/14/2023   PLT 234 02/14/2023   Lab Results  Component Value Date   FERRITIN 27 01/03/2023   IRON 113 01/03/2023   TIBC 494 (H) 01/03/2023   UIBC 381 (H) 01/03/2023   IRONPCTSAT 23 01/03/2023   Lab Results  Component Value Date   RETICCTPCT 1.8 02/14/2023   RBC 5.30 02/14/2023   RETICCTABS 77.0 02/22/2011   No results found for: "KPAFRELGTCHN",  "LAMBDASER", "KAPLAMBRATIO" No results found for: "IGGSERUM", "IGA", "IGMSERUM" No results found for: "TOTALPROTELP", "ALBUMINELP", "A1GS", "A2GS", "BETS", "BETA2SER", "GAMS", "MSPIKE", "SPEI"   Chemistry      Component Value Date/Time   NA 139 02/14/2023 1210   NA 141 05/09/2017 0910   K 4.3 02/14/2023 1210   K 4.2 05/09/2017 0910   CL 102 02/14/2023 1210   CL 105 11/13/2016 1123   CO2 30 02/14/2023 1210   CO2 24 05/09/2017 0910  BUN 12 02/14/2023 1210   BUN 15.2 05/09/2017 0910   CREATININE 1.21 02/14/2023 1210   CREATININE 1.2 05/09/2017 0910      Component Value Date/Time   CALCIUM 8.5 (L) 02/14/2023 1210   CALCIUM 9.1 05/09/2017 0910   ALKPHOS 90 02/14/2023 1210   ALKPHOS 96 05/09/2017 0910   AST 14 (L) 02/14/2023 1210   AST 22 05/09/2017 0910   ALT 16 02/14/2023 1210   ALT 30 05/09/2017 0910   BILITOT 0.6 02/14/2023 1210   BILITOT 0.48 05/09/2017 0910      Impression and Plan: Mr. Rajchel is a very pleasant 62 yo caucasian gentleman with secondary polycythemia.  He does not need a phlebotomy today.  Again, I see no problem with him having hip surgery.  I think the problem, if any, will be with his lungs.  I would like to get him back in 4 weeks.  At that point, we probably will have to phlebotomize him.   Josph Macho, MD 9/12/20241:06 PM

## 2023-03-18 ENCOUNTER — Other Ambulatory Visit: Payer: Self-pay | Admitting: Hematology & Oncology

## 2023-03-18 DIAGNOSIS — M47817 Spondylosis without myelopathy or radiculopathy, lumbosacral region: Secondary | ICD-10-CM

## 2023-03-18 MED ORDER — MORPHINE SULFATE 30 MG PO TABS: 30.0000 mg | ORAL_TABLET | ORAL | 0 refills | Status: DC | PRN

## 2023-03-18 NOTE — Telephone Encounter (Signed)
Last refilled 02/14/23 #180 with 0 refills. Pt prescribed to take 1 tablet q4h PRN. Please advise, thanks!

## 2023-04-03 ENCOUNTER — Encounter: Payer: Self-pay | Admitting: Hematology & Oncology

## 2023-04-03 ENCOUNTER — Inpatient Hospital Stay: Payer: Medicare PPO

## 2023-04-03 ENCOUNTER — Inpatient Hospital Stay: Payer: Medicare PPO | Attending: Hematology & Oncology

## 2023-04-03 ENCOUNTER — Inpatient Hospital Stay (HOSPITAL_BASED_OUTPATIENT_CLINIC_OR_DEPARTMENT_OTHER): Payer: Medicare PPO | Admitting: Hematology & Oncology

## 2023-04-03 ENCOUNTER — Other Ambulatory Visit: Payer: Self-pay

## 2023-04-03 VITALS — BP 131/81 | HR 72 | Temp 97.6°F | Resp 19 | Ht 72.0 in | Wt 282.1 lb

## 2023-04-03 VITALS — BP 140/81 | HR 67 | Resp 17

## 2023-04-03 DIAGNOSIS — D45 Polycythemia vera: Secondary | ICD-10-CM | POA: Insufficient documentation

## 2023-04-03 LAB — CBC WITH DIFFERENTIAL (CANCER CENTER ONLY)
Abs Immature Granulocytes: 0.05 10*3/uL (ref 0.00–0.07)
Basophils Absolute: 0.1 10*3/uL (ref 0.0–0.1)
Basophils Relative: 1 %
Eosinophils Absolute: 0.2 10*3/uL (ref 0.0–0.5)
Eosinophils Relative: 2 %
HCT: 46.2 % (ref 39.0–52.0)
Hemoglobin: 15.1 g/dL (ref 13.0–17.0)
Immature Granulocytes: 1 %
Lymphocytes Relative: 20 %
Lymphs Abs: 1.7 10*3/uL (ref 0.7–4.0)
MCH: 27.3 pg (ref 26.0–34.0)
MCHC: 32.7 g/dL (ref 30.0–36.0)
MCV: 83.4 fL (ref 80.0–100.0)
Monocytes Absolute: 0.5 10*3/uL (ref 0.1–1.0)
Monocytes Relative: 6 %
Neutro Abs: 6 10*3/uL (ref 1.7–7.7)
Neutrophils Relative %: 70 %
Platelet Count: 200 10*3/uL (ref 150–400)
RBC: 5.54 MIL/uL (ref 4.22–5.81)
RDW: 14.1 % (ref 11.5–15.5)
WBC Count: 8.6 10*3/uL (ref 4.0–10.5)
nRBC: 0 % (ref 0.0–0.2)

## 2023-04-03 LAB — CMP (CANCER CENTER ONLY)
ALT: 27 U/L (ref 0–44)
AST: 24 U/L (ref 15–41)
Albumin: 4.1 g/dL (ref 3.5–5.0)
Alkaline Phosphatase: 85 U/L (ref 38–126)
Anion gap: 10 (ref 5–15)
BUN: 15 mg/dL (ref 8–23)
CO2: 29 mmol/L (ref 22–32)
Calcium: 8.8 mg/dL — ABNORMAL LOW (ref 8.9–10.3)
Chloride: 101 mmol/L (ref 98–111)
Creatinine: 1.27 mg/dL — ABNORMAL HIGH (ref 0.61–1.24)
GFR, Estimated: >60 mL/min (ref >60)
Glucose, Bld: 152 mg/dL — ABNORMAL HIGH (ref 70–99)
Potassium: 4 mmol/L (ref 3.5–5.1)
Sodium: 140 mmol/L (ref 135–145)
Total Bilirubin: 0.6 mg/dL (ref 0.3–1.2)
Total Protein: 7.1 g/dL (ref 6.5–8.1)

## 2023-04-03 LAB — IRON AND IRON BINDING CAPACITY (CC-WL,HP ONLY)
Iron: 107 ug/dL (ref 45–182)
Saturation Ratios: 20 % (ref 17.9–39.5)
TIBC: 526 ug/dL — ABNORMAL HIGH (ref 250–450)
UIBC: 419 ug/dL — ABNORMAL HIGH (ref 117–376)

## 2023-04-03 LAB — FERRITIN: Ferritin: 13 ng/mL — ABNORMAL LOW (ref 24–336)

## 2023-04-03 NOTE — Patient Instructions (Signed)

## 2023-04-03 NOTE — Progress Notes (Signed)
Hematology Jacob Oncology Follow Up Visit  Jacob MCTIERNAN [id number] 1960/09/17 62 y.o. 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  His right hip continues to bother him quite a bit.  He really needs to have surgery for this.  I do not see a problem with him having surgery.  Hopefully, he will see the Orthopedist soon.  He has done quite well with his phlebotomies.  We will go ahead Jacob phlebotomize him today.  He is trying to cut back on smoking.  He is on a nicotine patch now.  He continues on his baby aspirin.  He does have the CPAP.  His breathing is doing pretty well.  He has had no change in bowel or bladder habits.  He has had no nausea or vomiting.  He has had no rashes.  Is been no leg swelling.  Overall, I would say his performance status is probably ECOG 1.       Medications:  Allergies as of 04/03/2023   No Known Allergies      Medication List        Accurate as of April 03, 2023 10:21 AM. If Jacob Bradford have any questions, ask your nurse or doctor.          acetaminophen 500 MG tablet Commonly known as: TYLENOL Take 1,000 mg by mouth every 6 (six) hours as needed for moderate pain.   albuterol 108 (90 Base) MCG/ACT inhaler Commonly known as: VENTOLIN HFA Inhale 2 puffs into the lungs every 6 (six) hours as needed for wheezing or shortness of breath.   albuterol (2.5 MG/3ML) 0.083% nebulizer solution Commonly known as: PROVENTIL SMARTSIG:3 Milliliter(s) Via Nebulizer 3 Times Daily PRN   ALPRAZolam 1 MG tablet Commonly known as: XANAX Take 1 tablet (1 mg total) by mouth 3 (three) times daily as needed for  anxiety.   aspirin 81 MG tablet Take 1 tablet (81 mg total) by mouth daily.   atorvastatin 20 MG tablet Commonly known as: LIPITOR Take 20 mg by mouth daily.   calcium carbonate 500 MG chewable tablet Commonly known as: TUMS - dosed in mg elemental calcium Chew 1 tablet by mouth 2 (two) times daily as needed for indigestion.   esomeprazole 40 MG capsule Commonly known as: NEXIUM Take 40 mg by mouth daily at 12 noon.   furosemide 20 MG tablet Commonly known as: LASIX Take 20 mg by mouth daily as needed for edema or fluid.   Goodys Extra Strength [id number] MG Pack Generic drug: Aspirin-Acetaminophen-Caffeine Take 1 packet by mouth 3 (three) times daily as needed (pain).   losartan-hydrochlorothiazide 100-12.5 MG tablet Commonly known as: HYZAAR Take 1 tablet by mouth daily.   morphine 30 MG tablet Commonly known as: MSIR Take 1 tablet (30 mg total) by mouth every 4 (four) hours as needed for severe pain (pain score 7-10).   naloxone 4 MG/0.1ML Liqd nasal spray kit Commonly known as: NARCAN   polyethylene glycol 17 g packet Commonly known as: MIRALAX / GLYCOLAX Take 17 g by mouth daily as needed for moderate constipation.   Spiriva Respimat 2.5 MCG/ACT Aers Generic drug: Tiotropium Bromide Monohydrate Inhale 2  puffs into the lungs daily.        Allergies:  No Known Allergies  Past Medical History, Surgical history, Social history, Jacob Family History were reviewed Jacob updated.  Review of Systems: Review of Systems  Constitutional: Negative.   HENT: Negative.    Eyes: Negative.   Respiratory: Negative.    Cardiovascular: Negative.   Gastrointestinal: Negative.   Genitourinary: Negative.   Musculoskeletal:  Positive for back pain Jacob neck pain.  Skin: Negative.   Neurological: Negative.   Endo/Heme/Allergies: Negative.   Psychiatric/Behavioral: Negative.       Physical Exam:  height is 6' (1.829 m) Jacob weight is 282 lb 1.3 oz (128 kg). His oral  temperature is 97.6 F (36.4 C). His blood pressure is 131/81 Jacob his pulse is 72. His respiration is 19 Jacob oxygen saturation is 96%.   Wt Readings from Last 3 Encounters:  04/03/23 282 lb 1.3 oz (128 kg)  02/14/23 282 lb (127.9 kg)  02/06/23 279 lb (126.6 kg)    Physical Exam Vitals reviewed.  HENT:     Head: Normocephalic Jacob atraumatic.  Eyes:     Pupils: Pupils are equal, round, Jacob reactive to light.  Cardiovascular:     Rate Jacob Rhythm: Normal rate Jacob regular rhythm.     Heart sounds: Normal heart sounds.  Pulmonary:     Effort: Pulmonary effort is normal.     Breath sounds: Normal breath sounds.  Abdominal:     General: Bowel sounds are normal.     Palpations: Abdomen is soft.  Musculoskeletal:        General: No tenderness or deformity. Normal range of motion.     Cervical back: Normal range of motion.     Comments: He has significant pain to palpation Jacob range of motion of the right hip.  Lymphadenopathy:     Cervical: No cervical adenopathy.  Skin:    General: Skin is warm Jacob dry.     Findings: No erythema or rash.  Neurological:     Mental Status: He is alert Jacob oriented to person, place, Jacob time.  Psychiatric:        Behavior: Behavior normal.        Thought Content: Thought content normal.        Judgment: Judgment normal.      Lab Results  Component Value Date   WBC 8.6 04/03/2023   HGB 15.1 04/03/2023   HCT 46.2 04/03/2023   MCV 83.4 04/03/2023   PLT 200 04/03/2023   Lab Results  Component Value Date   FERRITIN 21 (L) 02/14/2023   IRON 93 02/14/2023   TIBC 508 (H) 02/14/2023   UIBC 415 (H) 02/14/2023   IRONPCTSAT 18 02/14/2023   Lab Results  Component Value Date   RETICCTPCT 1.8 02/14/2023   RBC 5.54 04/03/2023   RETICCTABS 77.0 02/22/2011   No results found for: "KPAFRELGTCHN", "LAMBDASER", "KAPLAMBRATIO" No results found for: "IGGSERUM", "IGA", "IGMSERUM" No results found for: "TOTALPROTELP", "ALBUMINELP", "A1GS", "A2GS",  "BETS", "BETA2SER", "GAMS", "MSPIKE", "SPEI"   Chemistry      Component Value Date/Time   NA 140 04/03/2023 0907   NA 141 05/09/2017 0910   K 4.0 04/03/2023 0907   K 4.2 05/09/2017 0910   CL 101 04/03/2023 0907   CL 105 11/13/2016 1123   CO2 29 04/03/2023 0907   CO2 24 05/09/2017 0910   BUN 15 04/03/2023 0907   BUN 15.2 05/09/2017 0910   CREATININE 1.27 (H) 04/03/2023 8657  CREATININE 1.2 05/09/2017 0910      Component Value Date/Time   CALCIUM 8.8 (L) 04/03/2023 0907   CALCIUM 9.1 05/09/2017 0910   ALKPHOS 85 04/03/2023 0907   ALKPHOS 96 05/09/2017 0910   AST 24 04/03/2023 0907   AST 22 05/09/2017 0910   ALT 27 04/03/2023 0907   ALT 30 05/09/2017 0910   BILITOT 0.6 04/03/2023 0907   BILITOT 0.48 05/09/2017 0910      Impression Jacob Plan: Mr. Bradford is a very pleasant 62 yo caucasian gentleman with secondary polycythemia.  We will go ahead Jacob phlebotomize him.  Again he is worried about the hip surgery.  Is worried about a blood clot.  I told him that we can always have him on low-dose blood thinner before Jacob after surgery to help minimize that risk.  I do not think this would be a bad idea in all honesty.  He will let us know if Jacob when the orthopedist will schedule his surgery.  Will plan to get him back to see [place] in another 6 weeks.   Josph Macho, MD 10/30/202410:21 AM

## 2023-04-03 NOTE — Progress Notes (Signed)
Jacob Bradford presents today for phlebotomy per MD orders. Phlebotomy procedure started at 1045 and ended at 1102. 635 grams removed via 18 gauge needle to right AC and 20 gauge to outer right AC. Patient observed for 30 minutes after procedure without any incident. Patient tolerated procedure well. IV needle removed intact.

## 2023-04-11 ENCOUNTER — Ambulatory Visit: Payer: Medicare PPO | Admitting: Orthopaedic Surgery

## 2023-04-11 ENCOUNTER — Other Ambulatory Visit (INDEPENDENT_AMBULATORY_CARE_PROVIDER_SITE_OTHER): Payer: Medicare PPO

## 2023-04-11 ENCOUNTER — Encounter: Payer: Self-pay | Admitting: Orthopaedic Surgery

## 2023-04-11 VITALS — Ht 71.0 in | Wt 283.0 lb

## 2023-04-11 DIAGNOSIS — M1611 Unilateral primary osteoarthritis, right hip: Secondary | ICD-10-CM | POA: Diagnosis not present

## 2023-04-11 NOTE — Progress Notes (Signed)
Office Visit Note   Patient: Jacob Bradford           Date of Birth: 13-Apr-1961           MRN: [id number] Visit Date: 04/11/2023              Requested by: Elie Confer, NP [address] [place],  [place] [address] PCP: Elie Confer, NP   Assessment & Plan: Visit Diagnoses:  1. Primary osteoarthritis of right hip     Plan: [name] is a 62 year old gentleman who is now achieved a BMI of less than 40.  The fungal infection in his groin crease has resolved.  He is doing well.  At this point we can move forward with surgical scheduling pending preoperative clearances from his respective doctors.  Surgery handout was provided.  Questions encouraged and answered.  He takes morphine for chronic back pain.  He has polycythemia vera.  Impression is severe right hip degenerative joint disease secondary to Osteoarthritis.  Imaging shows bone on bone joint space narrowing.  At this point, conservative treatments fail to provide any significant relief and the pain is severely affecting ADLs and quality of life.  Based on treatment options, the patient has elected to move forward with a hip replacement.  We have discussed the surgical risks that include but are not limited to infection, DVT, leg length discrepancy, numbness, tingling, incomplete relief of pain.  Recovery and prognosis were also reviewed.    Current anticoagulants: aspirin 81 mg daily Postop anticoagulation: Aspirin 81 mg Diabetic: No  Prior DVT/PE: No Tobacco use: Yes < 1 ppd Clearances needed for surgery: Demetrios Loll Anticipate discharge dispo: home   Follow-Up Instructions: No follow-ups on file.   Orders:  Orders Placed This Encounter  Procedures   XR HIP UNILAT W OR W/O PELVIS 2-3 VIEWS RIGHT   No orders of the defined types were placed in this encounter.     Procedures: No procedures performed   Clinical Data: No additional findings.   Subjective: Chief Complaint  Patient  presents with   Right Hip - Pain    HPI Jacob Bradford returns today for follow-up for right hip DJD.  He has been losing weight recently and he is down to 283 pounds. Review of Systems  Constitutional: Negative.   HENT: Negative.    Eyes: Negative.   Respiratory: Negative.    Cardiovascular: Negative.   Gastrointestinal: Negative.   Endocrine: Negative.   Genitourinary: Negative.   Skin: Negative.   Allergic/Immunologic: Negative.   Neurological: Negative.   Hematological: Negative.   Psychiatric/Behavioral: Negative.    All other systems reviewed and are negative.    Objective: Vital Signs: Ht 5\' 11"  (1.803 m)   Wt 283 lb (128.4 kg)   BMI 39.47 kg/m   Physical Exam Vitals and nursing note reviewed.  Constitutional:      Appearance: He is well-developed.  HENT:     Head: Normocephalic and atraumatic.  Eyes:     Pupils: Pupils are equal, round, and reactive to light.  Pulmonary:     Effort: Pulmonary effort is normal.  Abdominal:     Palpations: Abdomen is soft.  Musculoskeletal:        General: Normal range of motion.     Cervical back: Neck supple.  Skin:    General: Skin is warm.  Neurological:     Mental Status: He is alert and oriented to person, place, and time.  Psychiatric:  Behavior: Behavior normal.        Thought Content: Thought content normal.        Judgment: Judgment normal.     Ortho Exam Exam of the right hip is unchanged from prior visit. Specialty Comments:  No specialty comments available.  Imaging: XR HIP UNILAT W OR W/O PELVIS 2-3 VIEWS RIGHT  Result Date: 04/11/2023 Advanced degenerative joint disease with bone-on-bone joint space narrowing of the right hip joint.    PMFS History: Patient Active Problem List   Diagnosis Date Noted   Oxygen dependent 09/26/2022   COPD (chronic obstructive pulmonary disease) (HCC) 09/07/2022   Snoring 07/19/2022   Chronic respiratory failure with hypoxia (HCC) 07/19/2022   Tobacco abuse  07/19/2022   AKI (acute kidney injury) (HCC) 06/03/2022   Influenza A 06/02/2022   Community acquired pneumonia 06/01/2022   Primary osteoarthritis of right hip 03/21/2022   Body mass index 40.0-44.9, adult (HCC) 03/21/2022   IDA (iron deficiency anemia) 11/13/2016   S/P lumbar spinal fusion 03/07/2016   Lunotriquetral ligament tear 10/11/2014   Tear of anterior cruciate ligament of knee 10/11/2014   Lumbosacral spondylosis without myelopathy 04/24/2011   Polycythemia vera (HCC) 04/24/2011   Sprain of ankle 04/21/2009   Past Medical History:  Diagnosis Date   Anxiety    Arthritis    Depression    GERD (gastroesophageal reflux disease)    Hyperlipidemia    Hypertension    Lunotriquetral ligament tear 10/11/2014   Polycythemia vera (HCC)    Polycythemia vera (HCC)    Spinal stenosis    Tear of anterior cruciate ligament of knee 10/11/2014    Family History  Problem Relation Age of Onset   Colon cancer Neg Hx    Colon polyps Neg Hx    Esophageal cancer Neg Hx    Rectal cancer Neg Hx    Stomach cancer Neg Hx     Past Surgical History:  Procedure Laterality Date   COLONOSCOPY     age 74- 3 benign polyps per pt    MAXIMUM ACCESS (MAS)POSTERIOR LUMBAR INTERBODY FUSION (PLIF) 2 LEVEL N/A 03/07/2016   Procedure: LUMBAR FOUR-FIVE,LUMBAR FIVE-SACRAL ONE MAXIMUM ACCESS (MAS) POSTERIOR LUMBAR INTERBODY FUSION (PLIF) 2 LEVEL;  Surgeon: Tia Alert, MD;  Location: [facility] OR;  Service: Neurosurgery;  Laterality: N/A;   TONSILLECTOMY     and Adenoid   Social History   Occupational History   Not on file  Tobacco Use   Smoking status: Every Day    Current packs/day: 0.00    Average packs/day: 0.5 packs/day for 45.0 years (22.5 ttl pk-yrs)    Types: Cigarettes    Start date: 07/14/1979    Last attempt to quit: 07/02/2021    Years since quitting: 1.7   Smokeless tobacco: Never   Tobacco comments:    Patient smoking a pack a day .   Vaping Use   Vaping status: Former  Substance and  Sexual Activity   Alcohol use: Yes    Alcohol/week: 0.0 standard drinks of alcohol    Comment: 2-3  drinks a month if this- rare    Drug use: No   Sexual activity: Yes

## 2023-04-17 ENCOUNTER — Ambulatory Visit
Admission: RE | Admit: 2023-04-17 | Discharge: 2023-04-17 | Disposition: A | Discharge status: Home / Self Care | Payer: Medicare PPO | Source: Ambulatory Visit | Attending: *Deleted | Admitting: *Deleted

## 2023-04-17 ENCOUNTER — Other Ambulatory Visit: Payer: Self-pay | Admitting: Hematology & Oncology

## 2023-04-17 DIAGNOSIS — Z87891 Personal history of nicotine dependence: Secondary | ICD-10-CM

## 2023-04-17 DIAGNOSIS — F17210 Nicotine dependence, cigarettes, uncomplicated: Secondary | ICD-10-CM

## 2023-04-17 DIAGNOSIS — M47817 Spondylosis without myelopathy or radiculopathy, lumbosacral region: Secondary | ICD-10-CM

## 2023-04-17 DIAGNOSIS — Z122 Encounter for screening for malignant neoplasm of respiratory organs: Secondary | ICD-10-CM

## 2023-04-17 MED ORDER — MORPHINE SULFATE 30 MG PO TABS: 30.0000 mg | ORAL_TABLET | ORAL | 0 refills | Status: DC | PRN

## 2023-04-30 ENCOUNTER — Other Ambulatory Visit: Payer: Self-pay

## 2023-04-30 MED ORDER — RIVAROXABAN 10 MG PO TABS: 10.0000 mg | ORAL_TABLET | Freq: Every day | ORAL | 1 refills | Status: DC

## 2023-05-04 ENCOUNTER — Other Ambulatory Visit: Payer: Self-pay | Admitting: Adult Health

## 2023-05-07 ENCOUNTER — Other Ambulatory Visit: Payer: Self-pay | Admitting: Pulmonary Disease

## 2023-05-14 NOTE — Progress Notes (Unsigned)
No chief complaint on file.  History of Present Illness: 62 yo male with history of anxiety, depression, GERD, HTN, HLD, polycythemia vera and spinal stenosis who is here as a new consult, referred by ***, for the evaluation of *** ? Pre-operative risk assessment.   Primary Care Physician: Elie Confer, NP   Past Medical History:  Diagnosis Date   Anxiety    Arthritis    Depression    GERD (gastroesophageal reflux disease)    Hyperlipidemia    Hypertension    Lunotriquetral ligament tear 10/11/2014   Polycythemia vera (HCC)    Polycythemia vera (HCC)    Spinal stenosis    Tear of anterior cruciate ligament of knee 10/11/2014    Past Surgical History:  Procedure Laterality Date   COLONOSCOPY     age 76- 3 benign polyps per pt    MAXIMUM ACCESS (MAS)POSTERIOR LUMBAR INTERBODY FUSION (PLIF) 2 LEVEL N/A 03/07/2016   Procedure: LUMBAR FOUR-FIVE,LUMBAR FIVE-SACRAL ONE MAXIMUM ACCESS (MAS) POSTERIOR LUMBAR INTERBODY FUSION (PLIF) 2 LEVEL;  Surgeon: Tia Alert, MD;  Location: [facility] OR;  Service: Neurosurgery;  Laterality: N/A;   TONSILLECTOMY     and Adenoid    Current Outpatient Medications  Medication Sig Dispense Refill   acetaminophen (TYLENOL) 500 MG tablet Take 1,000 mg by mouth every 6 (six) hours as needed for moderate pain.     albuterol (PROVENTIL) (2.5 MG/3ML) 0.083% nebulizer solution SMARTSIG:3 Milliliter(s) Via Nebulizer 3 Times Daily PRN     albuterol (VENTOLIN HFA) 108 (90 Base) MCG/ACT inhaler INHALE 2 PUFFS EVERY 6 HOURS AS NEEDED FOR WHEEZING OR SHORTNESS OF BREATH 18 g 5   ALPRAZolam (XANAX) 1 MG tablet Take 1 tablet (1 mg total) by mouth 3 (three) times daily as needed for anxiety. 30 tablet 0   aspirin 81 MG tablet Take 1 tablet (81 mg total) by mouth daily. 30 tablet 12   Aspirin-Acetaminophen-Caffeine (GOODYS EXTRA STRENGTH) 500-325-65 MG PACK Take 1 packet by mouth 3 (three) times daily as needed (pain).     atorvastatin (LIPITOR) 20 MG tablet Take 20 mg  by mouth daily.     calcium carbonate (TUMS - DOSED IN MG ELEMENTAL CALCIUM) 500 MG chewable tablet Chew 1 tablet by mouth 2 (two) times daily as needed for indigestion.     esomeprazole (NEXIUM) 40 MG capsule Take 40 mg by mouth daily at 12 noon.     furosemide (LASIX) 20 MG tablet Take 20 mg by mouth daily as needed for edema or fluid.     losartan-hydrochlorothiazide (HYZAAR) 100-12.5 MG tablet Take 1 tablet by mouth daily.     morphine (MSIR) 30 MG tablet Take 1 tablet (30 mg total) by mouth every 4 (four) hours as needed for severe pain (pain score 7-10). 180 tablet 0   naloxone (NARCAN) nasal spray 4 mg/0.1 mL  (Patient not taking: Reported on 04/03/2023)     polyethylene glycol (MIRALAX / GLYCOLAX) packet Take 17 g by mouth daily as needed for moderate constipation.     rivaroxaban (XARELTO) 10 MG TABS tablet Take 1 tablet (10 mg total) by mouth daily. Take starting 2 weeks prior to surgery date, stop taking 2 days before surgery and restart 2 days after surgery for 1 month. 30 tablet 1   Tiotropium Bromide Monohydrate (SPIRIVA RESPIMAT) 2.5 MCG/ACT AERS INHALE TWO PUFFS DAILY 4 g 11   No current facility-administered medications for this visit.    No Known Allergies  Social History  Socioeconomic History   Marital status: Widowed    Spouse name: Not on file   Number of children: Not on file   Years of education: Not on file   Highest education level: Not on file  Occupational History   Not on file  Tobacco Use   Smoking status: Every Day    Current packs/day: 0.00    Average packs/day: 0.5 packs/day for 45.0 years (22.5 ttl pk-yrs)    Types: Cigarettes    Start date: 07/14/1979    Last attempt to quit: 07/02/2021    Years since quitting: 1.8   Smokeless tobacco: Never   Tobacco comments:    Patient smoking a pack a day .   Vaping Use   Vaping status: Former  Substance and Sexual Activity   Alcohol use: Yes    Alcohol/week: 0.0 standard drinks of alcohol    Comment: 2-3   drinks a month if this- rare    Drug use: No   Sexual activity: Yes  Other Topics Concern   Not on file  Social History Narrative   Not on file   Social Determinants of Health   Financial Resource Strain: Not on file  Food Insecurity: Not on file  Transportation Needs: Not on file  Physical Activity: Not on file  Stress: Not on file  Social Connections: Not on file  Intimate Partner Violence: Not on file    Family History  Problem Relation Age of Onset   Colon cancer Neg Hx    Colon polyps Neg Hx    Esophageal cancer Neg Hx    Rectal cancer Neg Hx    Stomach cancer Neg Hx     Review of Systems:  As stated in the HPI and otherwise negative.   There were no vitals taken for this visit.  Physical Examination: General: Well developed, well nourished, NAD  HEENT: OP clear, mucus membranes moist  SKIN: warm, dry. No rashes. Neuro: No focal deficits  Musculoskeletal: Muscle strength 5/5 all ext  Psychiatric: Mood and affect normal  Neck: No JVD, no carotid bruits, no thyromegaly, no lymphadenopathy.  Lungs:Clear bilaterally, no wheezes, rhonci, crackles Cardiovascular: Regular rate and rhythm. No murmurs, gallops or rubs. Abdomen:Soft. Bowel sounds present. Non-tender.  Extremities: No lower extremity edema. Pulses are 2 + in the bilateral DP/PT.  EKG:  EKG {ACTION; IS/IS WNU:[id number]} ordered today. The ekg ordered today demonstrates ***  Recent Labs: 06/01/2022: B Natriuretic Peptide 105.7 04/03/2023: ALT 27; BUN 15; Creatinine 1.27; Hemoglobin 15.1; Platelet Count 200; Potassium 4.0; Sodium 140   Lipid Panel No results found for: "CHOL", "TRIG", "HDL", "CHOLHDL", "VLDL", "LDLCALC", "LDLDIRECT"   Wt Readings from Last 3 Encounters:  04/11/23 128.4 kg  04/03/23 128 kg  02/14/23 127.9 kg      Assessment and Plan:   1.   Labs/ tests ordered today include:  No orders of the defined types were placed in this encounter.    Disposition:   F/U with me in ***     Signed, Verne Carrow, MD, [facility] 05/14/2023 11:27 AM    [facility] [address] [place], [place], [place]  [address] Phone: [phone]; Fax: [phone]

## 2023-05-15 ENCOUNTER — Encounter: Payer: Self-pay | Admitting: Cardiovascular Disease

## 2023-05-15 ENCOUNTER — Other Ambulatory Visit: Payer: Self-pay

## 2023-05-15 ENCOUNTER — Inpatient Hospital Stay: Payer: Medicare PPO

## 2023-05-15 ENCOUNTER — Inpatient Hospital Stay: Payer: Medicare PPO | Attending: Hematology & Oncology

## 2023-05-15 ENCOUNTER — Ambulatory Visit: Payer: Medicare PPO | Attending: Cardiovascular Disease | Admitting: Cardiovascular Disease

## 2023-05-15 ENCOUNTER — Inpatient Hospital Stay (HOSPITAL_BASED_OUTPATIENT_CLINIC_OR_DEPARTMENT_OTHER): Payer: Medicare PPO | Admitting: Hematology & Oncology

## 2023-05-15 ENCOUNTER — Encounter: Payer: Self-pay | Admitting: Hematology & Oncology

## 2023-05-15 VITALS — BP 154/70 | HR 71 | Temp 97.8°F | Resp 18 | Ht 71.0 in | Wt 278.0 lb

## 2023-05-15 DIAGNOSIS — Z79899 Other long term (current) drug therapy: Secondary | ICD-10-CM | POA: Diagnosis not present

## 2023-05-15 DIAGNOSIS — Z01810 Encounter for preprocedural cardiovascular examination: Secondary | ICD-10-CM

## 2023-05-15 DIAGNOSIS — F17210 Nicotine dependence, cigarettes, uncomplicated: Secondary | ICD-10-CM | POA: Insufficient documentation

## 2023-05-15 DIAGNOSIS — M47817 Spondylosis without myelopathy or radiculopathy, lumbosacral region: Secondary | ICD-10-CM | POA: Diagnosis not present

## 2023-05-15 DIAGNOSIS — D45 Polycythemia vera: Secondary | ICD-10-CM

## 2023-05-15 DIAGNOSIS — Z7982 Long term (current) use of aspirin: Secondary | ICD-10-CM | POA: Diagnosis not present

## 2023-05-15 DIAGNOSIS — D751 Secondary polycythemia: Secondary | ICD-10-CM | POA: Diagnosis not present

## 2023-05-15 LAB — CMP (CANCER CENTER ONLY)
ALT: 22 U/L (ref 0–44)
AST: 15 U/L (ref 15–41)
Albumin: 3.9 g/dL (ref 3.5–5.0)
Alkaline Phosphatase: 94 U/L (ref 38–126)
Anion gap: 9 (ref 5–15)
BUN: 17 mg/dL (ref 8–23)
CO2: 31 mmol/L (ref 22–32)
Calcium: 8.9 mg/dL (ref 8.9–10.3)
Chloride: 95 mmol/L — ABNORMAL LOW (ref 98–111)
Creatinine: 1.28 mg/dL — ABNORMAL HIGH (ref 0.61–1.24)
GFR, Estimated: >60 mL/min (ref >60)
Glucose, Bld: 120 mg/dL — ABNORMAL HIGH (ref 70–99)
Potassium: 3.8 mmol/L (ref 3.5–5.1)
Sodium: 135 mmol/L (ref 135–145)
Total Bilirubin: 0.4 mg/dL (ref <1.2)
Total Protein: 7 g/dL (ref 6.5–8.1)

## 2023-05-15 LAB — CBC WITH DIFFERENTIAL (CANCER CENTER ONLY)
Abs Immature Granulocytes: 0.04 10*3/uL (ref 0.00–0.07)
Basophils Absolute: 0.1 10*3/uL (ref 0.0–0.1)
Basophils Relative: 1 %
Eosinophils Absolute: 0.4 10*3/uL (ref 0.0–0.5)
Eosinophils Relative: 5 %
HCT: 41.7 % (ref 39.0–52.0)
Hemoglobin: 13.1 g/dL (ref 13.0–17.0)
Immature Granulocytes: 1 %
Lymphocytes Relative: 29 %
Lymphs Abs: 2.4 10*3/uL (ref 0.7–4.0)
MCH: 26.7 pg (ref 26.0–34.0)
MCHC: 31.4 g/dL (ref 30.0–36.0)
MCV: 84.9 fL (ref 80.0–100.0)
Monocytes Absolute: 0.7 10*3/uL (ref 0.1–1.0)
Monocytes Relative: 8 %
Neutro Abs: 4.7 10*3/uL (ref 1.7–7.7)
Neutrophils Relative %: 56 %
Platelet Count: 182 10*3/uL (ref 150–400)
RBC: 4.91 MIL/uL (ref 4.22–5.81)
RDW: 14.4 % (ref 11.5–15.5)
WBC Count: 8.4 10*3/uL (ref 4.0–10.5)
nRBC: 0 % (ref 0.0–0.2)

## 2023-05-15 LAB — IRON AND IRON BINDING CAPACITY (CC-WL,HP ONLY)
Iron: 41 ug/dL — ABNORMAL LOW (ref 45–182)
Saturation Ratios: 8 % — ABNORMAL LOW (ref 17.9–39.5)
TIBC: 542 ug/dL — ABNORMAL HIGH (ref 250–450)
UIBC: 501 ug/dL — ABNORMAL HIGH (ref 117–376)

## 2023-05-15 LAB — RETICULOCYTES
Immature Retic Fract: 18 % — ABNORMAL HIGH (ref 2.3–15.9)
RBC.: 4.9 MIL/uL (ref 4.22–5.81)
Retic Count, Absolute: 76.9 10*3/uL (ref 19.0–186.0)
Retic Ct Pct: 1.6 % (ref 0.4–3.1)

## 2023-05-15 LAB — LACTATE DEHYDROGENASE: LDH: 150 U/L (ref 98–192)

## 2023-05-15 LAB — FERRITIN: Ferritin: 7 ng/mL — ABNORMAL LOW (ref 24–336)

## 2023-05-15 MED ORDER — MORPHINE SULFATE 30 MG PO TABS: 30.0000 mg | ORAL_TABLET | ORAL | 0 refills | Status: DC | PRN

## 2023-05-15 NOTE — Patient Instructions (Signed)
Medication Instructions:  No changes *If you need a refill on your cardiac medications before your next appointment, please call your pharmacy*   Lab Work: none If you have labs (blood work) drawn today and your tests are completely normal, you will receive your results only by: MyChart Message (if you have MyChart) OR A paper copy in the mail If you have any lab test that is abnormal or we need to change your treatment, we will call you to review the results.   Testing/Procedures: Your physician has requested that you have an echocardiogram. Echocardiography is a painless test that uses sound waves to create images of your heart. It provides your doctor with information about the size and shape of your heart and how well your heart's chambers and valves are working. This procedure takes approximately one hour. There are no restrictions for this procedure. Please do NOT wear cologne, perfume, aftershave, or lotions (deodorant is allowed). Please arrive 15 minutes prior to your appointment time.  Please note: We ask at that you not bring children with you during ultrasound (echo/ vascular) testing. Due to room size and safety concerns, children are not allowed in the ultrasound rooms during exams. Our front office staff cannot provide observation of children in our lobby area while testing is being conducted. An adult accompanying a patient to their appointment will only be allowed in the ultrasound room at the discretion of the ultrasound technician under special circumstances. We apologize for any inconvenience.    Follow-Up:  As needed

## 2023-05-15 NOTE — Progress Notes (Signed)
Hematology and Oncology Follow Up Visit  LEELIN WALDOCK [id number] [birth year] 62 y.o. 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 Wohler is here today for follow-up.  His right hip continues to bother him quite a bit.  This probably is a little bit worse for him.  He really needs to have the surgery.  Unfortunately, he needs to have all these clearances.  Thankfully, he does not need to be phlebotomized today.  Otherwise, he seems to be doing okay.  He smokes half pack a day of cigarettes.  He is trying to cut back on this.  He has had a decent appetite.  He had a nice Thanksgiving.  He is having some problems with the CPAP.  Hopefully, we will be able to get this sorted out.  He has had no change in bowel or bladder habits.  He has had no rashes.  There is been no leg swelling.  He has had no bleeding.  He has had no fever.  Overall, I would say that his performance status is probably ECOG 1.        Medications:  Allergies as of 05/15/2023   No Known Allergies      Medication List        Accurate as of May 15, 2023 10:07 AM. If you have any questions, ask your nurse or doctor.          acetaminophen 500 MG tablet Commonly known as: TYLENOL Take 1,000 mg by mouth every 6 (six) hours as needed for moderate pain.   albuterol (2.5 MG/3ML) 0.083% nebulizer solution Commonly known as: PROVENTIL SMARTSIG:3 Milliliter(s) Via Nebulizer 3 Times Daily PRN   albuterol 108 (90 Base) MCG/ACT inhaler Commonly known as: VENTOLIN HFA INHALE 2 PUFFS EVERY 6 HOURS AS NEEDED FOR WHEEZING OR SHORTNESS OF BREATH   ALPRAZolam 1 MG tablet Commonly known as:  XANAX Take 1 tablet (1 mg total) by mouth 3 (three) times daily as needed for anxiety.   aspirin 81 MG tablet Take 1 tablet (81 mg total) by mouth daily.   atorvastatin 20 MG tablet Commonly known as: LIPITOR Take 20 mg by mouth daily.   calcium carbonate 500 MG chewable tablet Commonly known as: TUMS - dosed in mg elemental calcium Chew 1 tablet by mouth 2 (two) times daily as needed for indigestion.   esomeprazole 40 MG capsule Commonly known as: NEXIUM Take 40 mg by mouth daily at 12 noon.   furosemide 20 MG tablet Commonly known as: LASIX Take 20 mg by mouth daily as needed for edema or fluid.   Goodys Extra Strength [id number] MG Pack Generic drug: Aspirin-Acetaminophen-Caffeine Take 1 packet by mouth 3 (three) times daily as needed (pain).   losartan-hydrochlorothiazide 100-12.5 MG tablet Commonly known as: HYZAAR Take 1 tablet by mouth daily.   morphine 30 MG tablet Commonly known as: MSIR Take 1 tablet (30 mg total) by mouth every 4 (four) hours as needed for severe pain (pain score 7-10).   naloxone 4 MG/0.1ML Liqd nasal spray kit Commonly known as: NARCAN   omeprazole 20 MG capsule Commonly known as: PRILOSEC Take 20 mg by mouth daily.   polyethylene glycol 17 g  packet Commonly known as: MIRALAX / GLYCOLAX Take 17 g by mouth daily as needed for moderate constipation.   rivaroxaban 10 MG Tabs tablet Commonly known as: XARELTO Take 1 tablet (10 mg total) by mouth daily. Take starting 2 weeks prior to surgery date, stop taking 2 days before surgery and restart 2 days after surgery for 1 month.   Spiriva Respimat 2.5 MCG/ACT Aers Generic drug: Tiotropium Bromide Monohydrate INHALE TWO PUFFS DAILY        Allergies:  No Known Allergies  Past Medical History, Surgical history, Social history, and Family History were reviewed and updated.  Review of Systems: Review of Systems  Constitutional: Negative.   HENT: Negative.    Eyes: Negative.    Respiratory: Negative.    Cardiovascular: Negative.   Gastrointestinal: Negative.   Genitourinary: Negative.   Musculoskeletal:  Positive for back pain and neck pain.  Skin: Negative.   Neurological: Negative.   Endo/Heme/Allergies: Negative.   Psychiatric/Behavioral: Negative.       Physical Exam:  height is 5\' 11"  (1.803 m) and weight is 278 lb (126.1 kg). His oral temperature is 97.8 F (36.6 C). His blood pressure is 154/70 (abnormal) and his pulse is 71. His respiration is 18 and oxygen saturation is 96%.   Wt Readings from Last 3 Encounters:  05/15/23 278 lb (126.1 kg)  04/11/23 283 lb (128.4 kg)  04/03/23 282 lb 1.3 oz (128 kg)    Physical Exam Vitals reviewed.  HENT:     Head: Normocephalic and atraumatic.  Eyes:     Pupils: Pupils are equal, round, and reactive to light.  Cardiovascular:     Rate and Rhythm: Normal rate and regular rhythm.     Heart sounds: Normal heart sounds.  Pulmonary:     Effort: Pulmonary effort is normal.     Breath sounds: Normal breath sounds.  Abdominal:     General: Bowel sounds are normal.     Palpations: Abdomen is soft.  Musculoskeletal:        General: No tenderness or deformity. Normal range of motion.     Cervical back: Normal range of motion.     Comments: He has significant pain to palpation and range of motion of the right hip.  Lymphadenopathy:     Cervical: No cervical adenopathy.  Skin:    General: Skin is warm and dry.     Findings: No erythema or rash.  Neurological:     Mental Status: He is alert and oriented to person, place, and time.  Psychiatric:        Behavior: Behavior normal.        Thought Content: Thought content normal.        Judgment: Judgment normal.     Lab Results  Component Value Date   WBC 8.4 05/15/2023   HGB 13.1 05/15/2023   HCT 41.7 05/15/2023   MCV 84.9 05/15/2023   PLT 182 05/15/2023   Lab Results  Component Value Date   FERRITIN 13 (L) 04/03/2023   IRON 107 04/03/2023    TIBC 526 (H) 04/03/2023   UIBC 419 (H) 04/03/2023   IRONPCTSAT 20 04/03/2023   Lab Results  Component Value Date   RETICCTPCT 1.6 05/15/2023   RBC 4.90 05/15/2023   RETICCTABS 77.0 02/22/2011   No results found for: "KPAFRELGTCHN", "LAMBDASER", "KAPLAMBRATIO" No results found for: "IGGSERUM", "IGA", "IGMSERUM" No results found for: "TOTALPROTELP", "ALBUMINELP", "A1GS", "A2GS", "BETS", "BETA2SER", "GAMS", "MSPIKE", "SPEI"   Chemistry  Component Value Date/Time   NA 135 05/15/2023 0902   NA 141 05/09/2017 0910   K 3.8 05/15/2023 0902   K 4.2 05/09/2017 0910   CL 95 (L) 05/15/2023 0902   CL 105 11/13/2016 1123   CO2 31 05/15/2023 0902   CO2 24 05/09/2017 0910   BUN 17 05/15/2023 0902   BUN 15.2 05/09/2017 0910   CREATININE 1.28 (H) 05/15/2023 0902   CREATININE 1.2 05/09/2017 0910      Component Value Date/Time   CALCIUM 8.9 05/15/2023 0902   CALCIUM 9.1 05/09/2017 0910   ALKPHOS 94 05/15/2023 0902   ALKPHOS 96 05/09/2017 0910   AST 15 05/15/2023 0902   AST 22 05/09/2017 0910   ALT 22 05/15/2023 0902   ALT 30 05/09/2017 0910   BILITOT 0.4 05/15/2023 0902   BILITOT 0.48 05/09/2017 0910      Impression and Plan: Mr. Assenmacher is a very pleasant 62 yo caucasian gentleman with secondary polycythemia.  Thankfully, he does not need to be phlebotomized today.  I am very happy about this.  Hopefully, he will be able to have the surgery within a month.  He really needs it.  The right hip is causing a lot of problems for him.  He really cannot exercise because of this.  I will would like to try to get him back in about 2 months.  I am sure that when he has his surgery, he probably lose a little bit of blood with the surgery.  I did give him the Xarelto to take in the perioperative period.  He knows how to do this.   Josph Macho, MD 12/11/202410:07 AM

## 2023-05-17 ENCOUNTER — Telehealth: Payer: Self-pay | Admitting: Acute Care

## 2023-05-17 NOTE — Telephone Encounter (Signed)
I have called the patient with the results of his low-dose CT.  His scan was read as a lung RADS 4B as there is a new indistinct posterior right upper lobe pulmonary nodule that measured 8.7 mm.  Radiology feel that this is potentially inflammatory and recommend a 66-month follow-up low-dose CT.  This will be due after February 8 th 2025.  The patient is in agreement with this plan and understands he will get a call to get this scheduled. There was also notation of a stable mildly enlarged 1.1 cm right paratracheal node that they favored to be a benign reactive cause. There was also notation of a dilated main pulmonary artery which was concerning for PAH. Patient is currently being evaluated for a hip replacement and has been to see cardiology for clearance prior to surgery.. Cardiology saw the results of the scan and have an echo planned for mid January 2025. Patient verbalized understanding of the above, he is in agreement with a 26-month follow-up low-dose screening CT to reevaluate the 8.7 mm right upper lobe nodule.  Jonita Albee, and [place] please notify PCP of results, let them know plan is for a follow-up CT in February 2025.  Patient is still smoking he is down to half a pack a day he is working hard to quit  Thanks so much

## 2023-05-20 ENCOUNTER — Other Ambulatory Visit: Payer: Self-pay

## 2023-05-20 ENCOUNTER — Ambulatory Visit: Payer: Medicare PPO | Admitting: Pulmonary Disease

## 2023-05-20 VITALS — BP 128/74 | HR 84 | Temp 97.7°F

## 2023-05-20 DIAGNOSIS — F17200 Nicotine dependence, unspecified, uncomplicated: Secondary | ICD-10-CM | POA: Diagnosis not present

## 2023-05-20 DIAGNOSIS — J449 Chronic obstructive pulmonary disease, unspecified: Secondary | ICD-10-CM

## 2023-05-20 DIAGNOSIS — R911 Solitary pulmonary nodule: Secondary | ICD-10-CM

## 2023-05-20 DIAGNOSIS — G4731 Primary central sleep apnea: Secondary | ICD-10-CM | POA: Diagnosis not present

## 2023-05-20 DIAGNOSIS — G4733 Obstructive sleep apnea (adult) (pediatric): Secondary | ICD-10-CM

## 2023-05-20 NOTE — Telephone Encounter (Signed)
Results/plan faxed to PCP and order placed for 3 months follow up CT

## 2023-05-20 NOTE — Patient Instructions (Signed)
I will see you in about 3 months  Continue working on quitting smoking  Continue inhalers  Get the materials you need for your BiPAP and continue to use it on a regular basis  Give [place] a call with any significant concerns

## 2023-05-20 NOTE — Progress Notes (Signed)
Jacob Bradford    [id number]    Apr 07, 1961  Primary Care Physician:Morton, Joelene Millin, NP  Referring Physician: Elie Confer, NP [address] [place],  [place] [address]  Chief complaint:   Patient with obstructive sleep apnea, in for follow-up  HPI:  History of complex sleep apnea Has not been using his BiPAP in the last month  Needing supplies  History of obstructive lung disease, active smoker trying to quit, down to about half a pack of cigarettes a day from about 2 packs a day  He does have some daytime sleepiness  Hospitalized in January for pneumonia and influenza  Chronic pain syndrome on narcotics, anxiety for which he is on Xanax Outpatient Encounter Medications as of 05/20/2023  Medication Sig   acetaminophen (TYLENOL) 500 MG tablet Take 1,000 mg by mouth every 6 (six) hours as needed for moderate pain.   albuterol (PROVENTIL) (2.5 MG/3ML) 0.083% nebulizer solution    albuterol (VENTOLIN HFA) 108 (90 Base) MCG/ACT inhaler INHALE 2 PUFFS EVERY 6 HOURS AS NEEDED FOR WHEEZING OR SHORTNESS OF BREATH   ALPRAZolam (XANAX) 1 MG tablet Take 1 tablet (1 mg total) by mouth 3 (three) times daily as needed for anxiety.   aspirin 81 MG tablet Take 1 tablet (81 mg total) by mouth daily.   Aspirin-Acetaminophen-Caffeine (GOODYS EXTRA STRENGTH) 500-325-65 MG PACK Take 1 packet by mouth 3 (three) times daily as needed (pain).   atorvastatin (LIPITOR) 20 MG tablet Take 20 mg by mouth daily.   calcium carbonate (TUMS - DOSED IN MG ELEMENTAL CALCIUM) 500 MG chewable tablet Chew 1 tablet by mouth 2 (two) times daily as needed for indigestion.   esomeprazole (NEXIUM) 40 MG capsule Take 40 mg by mouth daily at 12 noon.   furosemide (LASIX) 20 MG tablet Take 20 mg by mouth daily as needed for edema or fluid.   losartan-hydrochlorothiazide (HYZAAR) 100-12.5 MG tablet Take 1 tablet by mouth daily.   morphine (MSIR) 30 MG tablet Take 1 tablet (30 mg total) by mouth every  4 (four) hours as needed for severe pain (pain score 7-10).   naloxone (NARCAN) nasal spray 4 mg/0.1 mL    omeprazole (PRILOSEC) 20 MG capsule Take 20 mg by mouth daily.   polyethylene glycol (MIRALAX / GLYCOLAX) packet Take 17 g by mouth daily as needed for moderate constipation.   Tiotropium Bromide Monohydrate (SPIRIVA RESPIMAT) 2.5 MCG/ACT AERS INHALE TWO PUFFS DAILY   rivaroxaban (XARELTO) 10 MG TABS tablet Take 1 tablet (10 mg total) by mouth daily. Take starting 2 weeks prior to surgery date, stop taking 2 days before surgery and restart 2 days after surgery for 1 month. (Patient not taking: Reported on 05/20/2023)   No facility-administered encounter medications on file as of 05/20/2023.    Allergies as of 05/20/2023   (No Known Allergies)    Past Medical History:  Diagnosis Date   Anxiety    Arthritis    Depression    GERD (gastroesophageal reflux disease)    Hyperlipidemia    Hypertension    Lunotriquetral ligament tear 10/11/2014   Polycythemia vera (HCC)    Spinal stenosis    Tear of anterior cruciate ligament of knee 10/11/2014    Past Surgical History:  Procedure Laterality Date   COLONOSCOPY     age 89- 3 benign polyps per pt    MAXIMUM ACCESS (MAS)POSTERIOR LUMBAR INTERBODY FUSION (PLIF) 2 LEVEL N/A 03/07/2016   Procedure: LUMBAR FOUR-FIVE,LUMBAR  FIVE-SACRAL ONE MAXIMUM ACCESS (MAS) POSTERIOR LUMBAR INTERBODY FUSION (PLIF) 2 LEVEL;  Surgeon: Tia Alert, MD;  Location: [facility] OR;  Service: Neurosurgery;  Laterality: N/A;   TONSILLECTOMY     and Adenoid    Family History  Problem Relation Age of Onset   Heart disease Father    Colon cancer Neg Hx    Colon polyps Neg Hx    Esophageal cancer Neg Hx    Rectal cancer Neg Hx    Stomach cancer Neg Hx     Social History   Socioeconomic History   Marital status: Widowed    Spouse name: Not on file   Number of children: 2   Years of education: Not on file   Highest education level: Not on file  Occupational  History   Occupation: Disabled since 2019  Tobacco Use   Smoking status: Every Day    Current packs/day: 0.00    Average packs/day: 0.5 packs/day for 45.0 years (22.5 ttl pk-yrs)    Types: Cigarettes    Start date: 07/14/1979    Last attempt to quit: 07/02/2021    Years since quitting: 1.8   Smokeless tobacco: Never   Tobacco comments:    Patient smoking a pack a day .   Vaping Use   Vaping status: Former  Substance and Sexual Activity   Alcohol use: Yes    Alcohol/week: 0.0 standard drinks of alcohol    Comment: 2-3  drinks a month if this- rare    Drug use: No   Sexual activity: Yes  Other Topics Concern   Not on file  Social History Narrative   Not on file   Social Drivers of Health   Financial Resource Strain: Not on file  Food Insecurity: Not on file  Transportation Needs: Not on file  Physical Activity: Not on file  Stress: Not on file  Social Connections: Not on file  Intimate Partner Violence: Not on file    Review of Systems  Constitutional:  Positive for fatigue.  Respiratory:  Positive for apnea and shortness of breath.   Psychiatric/Behavioral:  Positive for sleep disturbance.     Vitals:   05/20/23 1358  BP: 128/74  Pulse: 84  Temp: 97.7 F (36.5 C)  SpO2: 97%     Physical Exam Constitutional:      Appearance: He is obese.  HENT:     Head: Normocephalic.     Nose: No congestion.     Mouth/Throat:     Pharynx: No oropharyngeal exudate.  Eyes:     General: No scleral icterus. Cardiovascular:     Rate and Rhythm: Normal rate and regular rhythm.     Heart sounds: No murmur heard.    No friction rub.  Pulmonary:     Effort: No respiratory distress.     Breath sounds: No stridor. No wheezing or rhonchi.  Musculoskeletal:     Cervical back: No rigidity.  Neurological:     Mental Status: He is alert.  Psychiatric:        Mood and Affect: Mood normal.   Data Reviewed: Recent sleep study reviewed showing complex sleep apnea Suboptimal  compliance with BiPAP ST Has not used BiPAP ST in the last month  Pulmonary function test with moderate obstruction in May 2024  Assessment:  Complex sleep apnea -Importance of treating his sleep disordered breathing discussed with the patient Cardiovascular neurological risk with noncompliance discussed  Obstructive lung disease Continue current inhalers -Encouraged to continue to work  on quitting smoking  Active smoker  Class II obesity -Graded activities as tolerated  Smoking cessation counseling   Plan/Recommendations:  Smoking cessation  Encouraged to get back to using BiPAP ST -He needs to get some supplies which I encouraged him to get sooner than later  Encouraged to call with concerns  Follow-up in 3 months  Weight loss efforts encouraged    Virl Diamond MD [facility] 05/20/2023, 2:30 PM  CC: Elie Confer, NP

## 2023-06-13 ENCOUNTER — Other Ambulatory Visit: Payer: Self-pay | Admitting: Hematology & Oncology

## 2023-06-13 DIAGNOSIS — M47817 Spondylosis without myelopathy or radiculopathy, lumbosacral region: Secondary | ICD-10-CM

## 2023-06-15 ENCOUNTER — Other Ambulatory Visit: Payer: Self-pay | Admitting: Hematology & Oncology

## 2023-06-15 DIAGNOSIS — M47817 Spondylosis without myelopathy or radiculopathy, lumbosacral region: Secondary | ICD-10-CM

## 2023-06-17 ENCOUNTER — Encounter: Payer: Self-pay | Admitting: Hematology & Oncology

## 2023-06-18 ENCOUNTER — Ambulatory Visit (HOSPITAL_COMMUNITY): Payer: Medicare PPO | Attending: Cardiovascular Disease

## 2023-06-18 DIAGNOSIS — Z01810 Encounter for preprocedural cardiovascular examination: Secondary | ICD-10-CM | POA: Insufficient documentation

## 2023-06-18 LAB — ECHOCARDIOGRAM COMPLETE
Area-P 1/2: 2.17 cm2
S' Lateral: 3.3 cm

## 2023-06-18 MED ORDER — PERFLUTREN LIPID MICROSPHERE
3.0000 mL | INTRAVENOUS | Status: AC | PRN
  Administered 2023-06-18: 3 mL via INTRAVENOUS

## 2023-06-21 ENCOUNTER — Other Ambulatory Visit: Payer: Self-pay | Admitting: Hematology & Oncology

## 2023-06-21 DIAGNOSIS — M47817 Spondylosis without myelopathy or radiculopathy, lumbosacral region: Secondary | ICD-10-CM

## 2023-06-28 ENCOUNTER — Encounter: Payer: Self-pay | Admitting: Acute Care

## 2023-07-10 ENCOUNTER — Inpatient Hospital Stay: Payer: Medicare PPO

## 2023-07-10 ENCOUNTER — Ambulatory Visit: Payer: Medicare PPO | Admitting: Medical Oncology

## 2023-07-12 ENCOUNTER — Encounter: Payer: Self-pay | Admitting: Hematology & Oncology

## 2023-07-12 ENCOUNTER — Inpatient Hospital Stay: Payer: Medicare PPO | Attending: Hematology & Oncology

## 2023-07-12 ENCOUNTER — Other Ambulatory Visit: Payer: Self-pay

## 2023-07-12 ENCOUNTER — Inpatient Hospital Stay (HOSPITAL_BASED_OUTPATIENT_CLINIC_OR_DEPARTMENT_OTHER): Payer: Medicare PPO | Admitting: Hematology & Oncology

## 2023-07-12 ENCOUNTER — Inpatient Hospital Stay: Payer: Medicare PPO

## 2023-07-12 VITALS — BP 157/86 | HR 71 | Temp 97.9°F | Resp 19 | Ht 71.0 in | Wt 284.0 lb

## 2023-07-12 VITALS — BP 119/70 | HR 69 | Resp 18

## 2023-07-12 DIAGNOSIS — D45 Polycythemia vera: Secondary | ICD-10-CM | POA: Insufficient documentation

## 2023-07-12 DIAGNOSIS — M47817 Spondylosis without myelopathy or radiculopathy, lumbosacral region: Secondary | ICD-10-CM

## 2023-07-12 LAB — CMP (CANCER CENTER ONLY)
ALT: 35 U/L (ref 0–44)
AST: 22 U/L (ref 15–41)
Albumin: 4.1 g/dL (ref 3.5–5.0)
Alkaline Phosphatase: 93 U/L (ref 38–126)
Anion gap: 10 (ref 5–15)
BUN: 15 mg/dL (ref 8–23)
CO2: 29 mmol/L (ref 22–32)
Calcium: 9.3 mg/dL (ref 8.9–10.3)
Chloride: 98 mmol/L (ref 98–111)
Creatinine: 1.21 mg/dL (ref 0.61–1.24)
GFR, Estimated: >60 mL/min (ref >60)
Glucose, Bld: 108 mg/dL — ABNORMAL HIGH (ref 70–99)
Potassium: 4.3 mmol/L (ref 3.5–5.1)
Sodium: 137 mmol/L (ref 135–145)
Total Bilirubin: 0.5 mg/dL (ref 0.0–1.2)
Total Protein: 7.2 g/dL (ref 6.5–8.1)

## 2023-07-12 LAB — CBC WITH DIFFERENTIAL (CANCER CENTER ONLY)
Abs Immature Granulocytes: 0.03 10*3/uL (ref 0.00–0.07)
Basophils Absolute: 0.1 10*3/uL (ref 0.0–0.1)
Basophils Relative: 1 %
Eosinophils Absolute: 0.4 10*3/uL (ref 0.0–0.5)
Eosinophils Relative: 7 %
HCT: 48.4 % (ref 39.0–52.0)
Hemoglobin: 14.9 g/dL (ref 13.0–17.0)
Immature Granulocytes: 1 %
Lymphocytes Relative: 29 %
Lymphs Abs: 1.9 10*3/uL (ref 0.7–4.0)
MCH: 25.7 pg — ABNORMAL LOW (ref 26.0–34.0)
MCHC: 30.8 g/dL (ref 30.0–36.0)
MCV: 83.4 fL (ref 80.0–100.0)
Monocytes Absolute: 0.6 10*3/uL (ref 0.1–1.0)
Monocytes Relative: 9 %
Neutro Abs: 3.6 10*3/uL (ref 1.7–7.7)
Neutrophils Relative %: 53 %
Platelet Count: 242 10*3/uL (ref 150–400)
RBC: 5.8 MIL/uL (ref 4.22–5.81)
RDW: 15.9 % — ABNORMAL HIGH (ref 11.5–15.5)
WBC Count: 6.6 10*3/uL (ref 4.0–10.5)
nRBC: 0 % (ref 0.0–0.2)

## 2023-07-12 MED ORDER — MORPHINE SULFATE 30 MG PO TABS: 30.0000 mg | ORAL_TABLET | Freq: Four times a day (QID) | ORAL | 0 refills | Status: DC | PRN

## 2023-07-12 MED ORDER — MORPHINE SULFATE 30 MG PO TABS: 30.0000 mg | ORAL_TABLET | ORAL | 0 refills | Status: DC | PRN

## 2023-07-12 NOTE — Progress Notes (Signed)
 Jacob Bradford presents today for phlebotomy per MD orders. Phlebotomy procedure started at 1239 and ended at 1301. 550 grams removed via 20 gauge needle to left AC. Left AC PIV removed after blood stopped flowing and new 20 gauge PIV placed to left inner forearm and phlebotomy continued. Patient observed for 30 minutes after procedure without any incident. Patient tolerated procedure well. IV needle removed intact.

## 2023-07-12 NOTE — Progress Notes (Signed)
 Hematology and Oncology Follow Up Visit  Jacob Bradford [id number] Jan 23, 1961 63 y.o. 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  His right hip continues to bother him quite a bit.  This probably is a little bit worse for him.  He is not sure when he is going to have surgery.  He really needs to have surgery.  His weight is going back up.  He just cannot exercise because of the hip.  I think he is not using his CPAP all the time.  I think he may have gotten some new hardware for the CPAP so he will use.  He still is having his pain issues.  Everything is pretty stable with that.  He has had no fever.  He has had no nausea or vomiting.  He has had no change in bowel or bladder habits.  There is been no rashes.  Thankfully, he has had no issues with COVID.  He is still smoking.  He is trying to cut back slowly.  Overall, I would say his performance status is probably ECOG 1.         Medications:  Allergies as of 07/12/2023   No Known Allergies      Medication List        Accurate as of July 12, 2023 12:26 PM. If you have any questions, ask your nurse or doctor.          acetaminophen  500 MG tablet Commonly known as: TYLENOL  Take 1,000 mg by mouth every 6 (six) hours as needed for moderate pain.   albuterol  (2.5 MG/3ML) 0.083% nebulizer solution Commonly known as: PROVENTIL    albuterol  108 (90 Base) MCG/ACT inhaler Commonly known as: VENTOLIN  HFA INHALE 2 PUFFS EVERY 6 HOURS AS NEEDED FOR WHEEZING OR SHORTNESS OF BREATH   ALPRAZolam  1 MG tablet Commonly known as: XANAX  Take 1 tablet (1 mg total) by mouth 3 (three)  times daily as needed for anxiety.   aspirin  81 MG tablet Take 1 tablet (81 mg total) by mouth daily.   atorvastatin 20 MG tablet Commonly known as: LIPITOR Take 20 mg by mouth daily.   calcium  carbonate 500 MG chewable tablet Commonly known as: TUMS - dosed in mg elemental calcium  Chew 1 tablet by mouth 2 (two) times daily as needed for indigestion.   esomeprazole 40 MG capsule Commonly known as: NEXIUM Take 40 mg by mouth daily at 12 noon.   furosemide 20 MG tablet Commonly known as: LASIX Take 20 mg by mouth daily as needed for edema or fluid.   Goodys Extra Strength 500-325-65 MG Pack Generic drug: Aspirin -Acetaminophen -Caffeine Take 1 packet by mouth 3 (three) times daily as needed (pain).   losartan-hydrochlorothiazide  100-12.5 MG tablet Commonly known as: HYZAAR Take 1 tablet by mouth daily.   morphine  30 MG tablet Commonly known as: MSIR Take 1 tablet (30 mg total) by mouth every 6 (six) hours as needed for severe pain (pain score 7-10).   naloxone 4 MG/0.1ML Liqd nasal spray kit Commonly known as: NARCAN   omeprazole 20 MG capsule Commonly known as: PRILOSEC Take 20 mg by mouth daily.   polyethylene glycol  17 g packet Commonly known as: MIRALAX  / GLYCOLAX  Take 17 g by mouth daily as needed for moderate constipation.   rivaroxaban  10 MG Tabs tablet Commonly known as: XARELTO  Take 1 tablet (10 mg total) by mouth daily. Take starting 2 weeks prior to surgery date, stop taking 2 days before surgery and restart 2 days after surgery for 1 month.   Spiriva  Respimat 2.5 MCG/ACT Aers Generic drug: Tiotropium Bromide Monohydrate  INHALE TWO PUFFS DAILY        Allergies:  No Known Allergies  Past Medical History, Surgical history, Social history, and Family History were reviewed and updated.  Review of Systems: Review of Systems  Constitutional: Negative.   HENT: Negative.    Eyes: Negative.   Respiratory: Negative.    Cardiovascular: Negative.    Gastrointestinal: Negative.   Genitourinary: Negative.   Musculoskeletal:  Positive for back pain and neck pain.  Skin: Negative.   Neurological: Negative.   Endo/Heme/Allergies: Negative.   Psychiatric/Behavioral: Negative.       Physical Exam:  height is 5' 11 (1.803 m) and weight is 284 lb (128.8 kg). His oral temperature is 97.9 F (36.6 C). His blood pressure is 157/86 (abnormal) and his pulse is 71. His respiration is 19 and oxygen saturation is 98%.   Wt Readings from Last 3 Encounters:  07/12/23 284 lb (128.8 kg)  05/15/23 286 lb (129.7 kg)  05/15/23 278 lb (126.1 kg)    Physical Exam Vitals reviewed.  HENT:     Head: Normocephalic and atraumatic.  Eyes:     Pupils: Pupils are equal, round, and reactive to light.  Cardiovascular:     Rate and Rhythm: Normal rate and regular rhythm.     Heart sounds: Normal heart sounds.  Pulmonary:     Effort: Pulmonary effort is normal.     Breath sounds: Normal breath sounds.  Abdominal:     General: Bowel sounds are normal.     Palpations: Abdomen is soft.  Musculoskeletal:        General: No tenderness or deformity. Normal range of motion.     Cervical back: Normal range of motion.     Comments: He has significant pain to palpation and range of motion of the right hip.  Lymphadenopathy:     Cervical: No cervical adenopathy.  Skin:    General: Skin is warm and dry.     Findings: No erythema or rash.  Neurological:     Mental Status: He is alert and oriented to person, place, and time.  Psychiatric:        Behavior: Behavior normal.        Thought Content: Thought content normal.        Judgment: Judgment normal.      Lab Results  Component Value Date   WBC 6.6 07/12/2023   HGB 14.9 07/12/2023   HCT 48.4 07/12/2023   MCV 83.4 07/12/2023   PLT 242 07/12/2023   Lab Results  Component Value Date   FERRITIN 7 (L) 05/15/2023   IRON 41 (L) 05/15/2023   TIBC 542 (H) 05/15/2023   UIBC 501 (H) 05/15/2023    IRONPCTSAT 8 (L) 05/15/2023   Lab Results  Component Value Date   RETICCTPCT 1.6 05/15/2023   RBC 5.80 07/12/2023   RETICCTABS 77.0 02/22/2011   No results found for: KPAFRELGTCHN, LAMBDASER, KAPLAMBRATIO No results found for: IGGSERUM, IGA, IGMSERUM No results found for: TOTALPROTELP, ALBUMINELP, A1GS, A2GS, BETS, BETA2SER, GAMS, MSPIKE, SPEI   Chemistry  Component Value Date/Time   NA 137 07/12/2023 1124   NA 141 05/09/2017 0910   K 4.3 07/12/2023 1124   K 4.2 05/09/2017 0910   CL 98 07/12/2023 1124   CL 105 11/13/2016 1123   CO2 29 07/12/2023 1124   CO2 24 05/09/2017 0910   BUN 15 07/12/2023 1124   BUN 15.2 05/09/2017 0910   CREATININE 1.21 07/12/2023 1124   CREATININE 1.2 05/09/2017 0910      Component Value Date/Time   CALCIUM  9.3 07/12/2023 1124   CALCIUM  9.1 05/09/2017 0910   ALKPHOS 93 07/12/2023 1124   ALKPHOS 96 05/09/2017 0910   AST 22 07/12/2023 1124   AST 22 05/09/2017 0910   ALT 35 07/12/2023 1124   ALT 30 05/09/2017 0910   BILITOT 0.5 07/12/2023 1124   BILITOT 0.48 05/09/2017 0910      Impression and Plan: Jacob Bradford is a very pleasant 63 yo caucasian gentleman with secondary polycythemia.  I am a little bit surprised that his hematocrit is up so high.  Some of this might be from not using the CPAP all the time.  We will have to phlebotomize him.  Again, surgery is can be the only way to help the hip.  Hopefully he will be able to have surgery in the next couple months.  We will plan to get him back to see us  in another 6 weeks.  I did give him the Xarelto  to take in the perioperative period.  He knows how to do this.   Maude JONELLE Crease, MD 2/7/202512:26 PM

## 2023-07-12 NOTE — Patient Instructions (Signed)

## 2023-07-16 ENCOUNTER — Other Ambulatory Visit: Payer: Self-pay | Admitting: Hematology & Oncology

## 2023-07-16 DIAGNOSIS — M47817 Spondylosis without myelopathy or radiculopathy, lumbosacral region: Secondary | ICD-10-CM

## 2023-07-17 ENCOUNTER — Encounter: Payer: Self-pay | Admitting: Hematology & Oncology

## 2023-07-17 MED ORDER — MORPHINE SULFATE 30 MG PO TABS: 30.0000 mg | ORAL_TABLET | ORAL | 0 refills | Status: DC | PRN

## 2023-07-18 ENCOUNTER — Ambulatory Visit
Admission: RE | Admit: 2023-07-18 | Discharge: 2023-07-18 | Disposition: A | Discharge status: Home / Self Care | Payer: Medicare PPO | Source: Ambulatory Visit | Attending: *Deleted | Admitting: *Deleted

## 2023-07-18 DIAGNOSIS — R911 Solitary pulmonary nodule: Secondary | ICD-10-CM

## 2023-07-23 ENCOUNTER — Other Ambulatory Visit: Payer: Self-pay

## 2023-07-29 NOTE — Progress Notes (Signed)
 Surgical Instructions   Your procedure is scheduled on Friday March 7. Report to [facility] Main Entrance "A" at 7:30 A.M., then check in with the Admitting office. Any questions or running late day of surgery: call [phone]  Questions prior to your surgery date: call [phone], Monday-Friday, 8am-4pm. If you experience any cold or flu symptoms such as cough, fever, chills, shortness of breath, etc. between now and your scheduled surgery, please notify us at the above number.     Remember:  Do not eat after midnight the night before your surgery  You may drink clear liquids until 7:00am the morning of your surgery.   Clear liquids allowed are: Water, Non-Citrus Juices (without pulp), Carbonated Beverages, Clear Tea (no milk, honey, etc.), Black Coffee Only (NO MILK, CREAM OR POWDERED CREAMER of any kind), and Gatorade.  Patient Instructions  The night before surgery:  No food after midnight. ONLY clear liquids after midnight  The day of surgery (if you do NOT have diabetes):  Drink ONE (1) Pre-Surgery Clear Ensure by 7:00am the morning of surgery. Drink in one sitting. Do not sip.  This drink was given to you during your hospital  pre-op appointment visit.  Nothing else to drink after completing the  Pre-Surgery Clear Ensure.         If you have questions, please contact your surgeon's office.   Take these medicines the morning of surgery with A SIP OF WATER  atorvastatin (LIPITOR)  omeprazole (PRILOSEC)  Tiotropium Bromide Monohydrate (SPIRIVA RESPIMAT)   May take these medicines IF NEEDED: acetaminophen (TYLENOL)  albuterol (VENTOLIN HFA) 108 (90 Base) MCG/ACT inhaler -Bring to the hospital morphine (MSIR)   One week prior to surgery, STOP taking any Aleve, Naproxen, Ibuprofen, Motrin, Advil, Goody's, BC's, all herbal medications, fish oil, and non-prescription vitamins. Stop taking Aspirin-Acetaminophen-Caffeine (GOODYS EXTRA STRENGTH)           FOLLOW YOUR  SURGEON'S INSTRUCTIONS FOR WHEN TO STOP ASPIRIN. IF NO INSTRUCTIONS WERE GIVEN, YOU WILL NEED TO CALL THE OFFICE TODAY.            Do NOT Smoke (Tobacco/Vaping) for 24 hours prior to your procedure.  If you use a CPAP at night, you may bring your mask/headgear for your overnight stay.   You will be asked to remove any contacts, glasses, piercing's, hearing aid's, dentures/partials prior to surgery. Please bring cases for these items if needed.    Patients discharged the day of surgery will not be allowed to drive home, and someone needs to stay with them for 24 hours.  SURGICAL WAITING ROOM VISITATION Patients may have no more than 2 support people in the waiting area - these visitors may rotate.   Pre-op nurse will coordinate an appropriate time for 1 ADULT support person, who may not rotate, to accompany patient in pre-op.  Children under the age of 67 must have an adult with them who is not the patient and must remain in the main waiting area with an adult.  If the patient needs to stay at the hospital during part of their recovery, the visitor guidelines for inpatient rooms apply.  Please refer to the [facility] website for the visitor guidelines for any additional information.   If you received a COVID test during your pre-op visit  it is requested that you wear a mask when out in public, stay away from anyone that may not be feeling well and notify your surgeon if you develop symptoms. If you have been  in contact with anyone that has tested positive in the last 10 days please notify you surgeon.      Pre-operative 5 CHG Bathing Instructions   You can play a key role in reducing the risk of infection after surgery. Your skin needs to be as free of germs as possible. You can reduce the number of germs on your skin by washing with CHG (chlorhexidine gluconate) soap before surgery. CHG is an antiseptic soap that kills germs and continues to kill germs even after washing.   DO NOT use  if you have an allergy to chlorhexidine/CHG or antibacterial soaps. If your skin becomes reddened or irritated, stop using the CHG and notify one of our RNs at [phone].   Please shower with the CHG soap starting 4 days before surgery using the following schedule:     Please keep in mind the following:  DO NOT shave, including legs and underarms, starting the day of your first shower.   You may shave your face at any point before/day of surgery.  Place clean sheets on your bed the day you start using CHG soap. Use a clean washcloth (not used since being washed) for each shower. DO NOT sleep with pets once you start using the CHG.   CHG Shower Instructions:  Wash your face and private area with normal soap. If you choose to wash your hair, wash first with your normal shampoo.  After you use shampoo/soap, rinse your hair and body thoroughly to remove shampoo/soap residue.  Turn the water OFF and apply about 3 tablespoons (45 ml) of CHG soap to a CLEAN washcloth.  Apply CHG soap ONLY FROM YOUR NECK DOWN TO YOUR TOES (washing for 3-5 minutes)  DO NOT use CHG soap on face, private areas, open wounds, or sores.  Pay special attention to the area where your surgery is being performed.  If you are having back surgery, having someone wash your back for you may be helpful. Wait 2 minutes after CHG soap is applied, then you may rinse off the CHG soap.  Pat dry with a clean towel  Put on clean clothes/pajamas   If you choose to wear lotion, please use ONLY the CHG-compatible lotions that are listed below.  Additional instructions for the day of surgery: DO NOT APPLY any lotions, deodorants, cologne, or perfumes.   Do not bring valuables to the hospital. [facility] is not responsible for any belongings/valuables. Do not wear nail polish, gel polish, artificial nails, or any other type of covering on natural nails (fingers and toes) Do not wear jewelry or makeup Put on clean/comfortable  clothes.  Please brush your teeth.  Ask your nurse before applying any prescription medications to the skin.     CHG Compatible Lotions   Aveeno Moisturizing lotion  Cetaphil Moisturizing Cream  Cetaphil Moisturizing Lotion  Clairol Herbal Essence Moisturizing Lotion, Dry Skin  Clairol Herbal Essence Moisturizing Lotion, Extra Dry Skin  Clairol Herbal Essence Moisturizing Lotion, Normal Skin  Curel Age Defying Therapeutic Moisturizing Lotion with Alpha Hydroxy  Curel Extreme Care Body Lotion  Curel Soothing Hands Moisturizing Hand Lotion  Curel Therapeutic Moisturizing Cream, Fragrance-Free  Curel Therapeutic Moisturizing Lotion, Fragrance-Free  Curel Therapeutic Moisturizing Lotion, Original Formula  Eucerin Daily Replenishing Lotion  Eucerin Dry Skin Therapy Plus Alpha Hydroxy Crme  Eucerin Dry Skin Therapy Plus Alpha Hydroxy Lotion  Eucerin Original Crme  Eucerin Original Lotion  Eucerin Plus Crme Eucerin Plus Lotion  Eucerin TriLipid Replenishing Lotion  Keri Anti-Bacterial  Hand Lotion  Keri Deep Conditioning Original Lotion Dry Skin Formula Softly Scented  Keri Deep Conditioning Original Lotion, Fragrance Free Sensitive Skin Formula  Keri Lotion Fast Absorbing Fragrance Free Sensitive Skin Formula  Keri Lotion Fast Absorbing Softly Scented Dry Skin Formula  Keri Original Lotion  Keri Skin Renewal Lotion Keri Silky Smooth Lotion  Keri Silky Smooth Sensitive Skin Lotion  Nivea Body Creamy Conditioning Oil  Nivea Body Extra Enriched Lotion  Nivea Body Original Lotion  Nivea Body Sheer Moisturizing Lotion Nivea Crme  Nivea Skin Firming Lotion  NutraDerm 30 Skin Lotion  NutraDerm Skin Lotion  NutraDerm Therapeutic Skin Cream  NutraDerm Therapeutic Skin Lotion  ProShield Protective Hand Cream  Provon moisturizing lotion  Please read over the following fact sheets that you were given.

## 2023-07-30 ENCOUNTER — Encounter (HOSPITAL_COMMUNITY)
Admission: RE | Admit: 2023-07-30 | Discharge: 2023-07-30 | Disposition: A | Discharge status: Home / Self Care | Payer: Medicare PPO | Source: Ambulatory Visit | Attending: Orthopaedic Surgery | Admitting: Orthopaedic Surgery

## 2023-07-30 ENCOUNTER — Telehealth: Payer: Self-pay | Admitting: Orthopaedic Surgery

## 2023-07-30 ENCOUNTER — Encounter (HOSPITAL_COMMUNITY): Payer: Self-pay

## 2023-07-30 ENCOUNTER — Other Ambulatory Visit: Payer: Self-pay

## 2023-07-30 VITALS — BP 161/85 | HR 91 | Temp 97.9°F | Resp 17 | Ht 72.0 in | Wt 279.0 lb

## 2023-07-30 DIAGNOSIS — G8929 Other chronic pain: Secondary | ICD-10-CM | POA: Insufficient documentation

## 2023-07-30 DIAGNOSIS — M1611 Unilateral primary osteoarthritis, right hip: Secondary | ICD-10-CM | POA: Insufficient documentation

## 2023-07-30 DIAGNOSIS — M25551 Pain in right hip: Secondary | ICD-10-CM | POA: Insufficient documentation

## 2023-07-30 DIAGNOSIS — I4519 Other right bundle-branch block: Secondary | ICD-10-CM | POA: Insufficient documentation

## 2023-07-30 DIAGNOSIS — I10 Essential (primary) hypertension: Secondary | ICD-10-CM | POA: Diagnosis not present

## 2023-07-30 DIAGNOSIS — Z01818 Encounter for other preprocedural examination: Secondary | ICD-10-CM

## 2023-07-30 DIAGNOSIS — Z01812 Encounter for preprocedural laboratory examination: Secondary | ICD-10-CM | POA: Diagnosis not present

## 2023-07-30 DIAGNOSIS — Z7901 Long term (current) use of anticoagulants: Secondary | ICD-10-CM | POA: Insufficient documentation

## 2023-07-30 DIAGNOSIS — D45 Polycythemia vera: Secondary | ICD-10-CM | POA: Insufficient documentation

## 2023-07-30 DIAGNOSIS — G4733 Obstructive sleep apnea (adult) (pediatric): Secondary | ICD-10-CM | POA: Diagnosis not present

## 2023-07-30 DIAGNOSIS — F17200 Nicotine dependence, unspecified, uncomplicated: Secondary | ICD-10-CM | POA: Diagnosis not present

## 2023-07-30 DIAGNOSIS — I700 Atherosclerosis of aorta: Secondary | ICD-10-CM | POA: Insufficient documentation

## 2023-07-30 DIAGNOSIS — E785 Hyperlipidemia, unspecified: Secondary | ICD-10-CM | POA: Diagnosis not present

## 2023-07-30 DIAGNOSIS — K219 Gastro-esophageal reflux disease without esophagitis: Secondary | ICD-10-CM | POA: Diagnosis not present

## 2023-07-30 HISTORY — DX: Sleep apnea, unspecified: G47.30

## 2023-07-30 LAB — SURGICAL PCR SCREEN
MRSA, PCR: POSITIVE — AB
Staphylococcus aureus: POSITIVE — AB

## 2023-07-30 LAB — CBC
HCT: 48.4 % (ref 39.0–52.0)
Hemoglobin: 15.1 g/dL (ref 13.0–17.0)
MCH: 25.3 pg — ABNORMAL LOW (ref 26.0–34.0)
MCHC: 31.2 g/dL (ref 30.0–36.0)
MCV: 81.2 fL (ref 80.0–100.0)
Platelets: 220 10*3/uL (ref 150–400)
RBC: 5.96 MIL/uL — ABNORMAL HIGH (ref 4.22–5.81)
RDW: 15.9 % — ABNORMAL HIGH (ref 11.5–15.5)
WBC: 8.2 10*3/uL (ref 4.0–10.5)
nRBC: 0 % (ref 0.0–0.2)

## 2023-07-30 LAB — BASIC METABOLIC PANEL
Anion gap: 9 (ref 5–15)
BUN: 14 mg/dL (ref 8–23)
CO2: 27 mmol/L (ref 22–32)
Calcium: 8.4 mg/dL — ABNORMAL LOW (ref 8.9–10.3)
Chloride: 102 mmol/L (ref 98–111)
Creatinine, Ser: 1.22 mg/dL (ref 0.61–1.24)
GFR, Estimated: >60 mL/min (ref >60)
Glucose, Bld: 120 mg/dL — ABNORMAL HIGH (ref 70–99)
Potassium: 4.5 mmol/L (ref 3.5–5.1)
Sodium: 138 mmol/L (ref 135–145)

## 2023-07-30 LAB — TYPE AND SCREEN
ABO/RH(D): O POS
Antibody Screen: NEGATIVE

## 2023-07-30 NOTE — Progress Notes (Signed)
MRSA positive

## 2023-07-30 NOTE — Progress Notes (Signed)
 PCP - Vida Rigger Cardiologist -   PPM/ICD - denies Device Orders -  Rep Notified -   Chest x-ray - 07-19-22 EKG - 05-15-23 Stress Test -  ECHO - 06-18-23 Cardiac Cath -   Sleep Study - denies CPAP -   DM -denies  Blood Thinner Instructions:(XARELTO)  patient to stop 4 days prior to surgery last dose 08-04-23 Aspirin Instructions: per patient he has stopped  ERAS Protcol - Clear liquids until 7:00  PRE-SURGERY Ensure  COVID TEST- n/a   Anesthesia review: yes  Patient denies shortness of breath, fever, cough and chest pain at PAT appointment   All instructions explained to the patient, with a verbal understanding of the material. Patient agrees to go over the instructions while at home for a better understanding. Patient also instructed to self quarantine after being tested for COVID-19. The opportunity to ask questions was provided.

## 2023-07-30 NOTE — Telephone Encounter (Signed)
 Victorino Dike with [facility] preadmission testing calling.  Patients PCR came back positive for staph & MRSA

## 2023-07-30 NOTE — Progress Notes (Signed)
 Message left for Debbie dr. Roda Shutters scheduler made aware of +MRSA and + staph

## 2023-07-31 ENCOUNTER — Telehealth: Payer: Self-pay | Admitting: Orthopaedic Surgery

## 2023-07-31 ENCOUNTER — Other Ambulatory Visit: Payer: Self-pay | Admitting: Physician Assistant

## 2023-07-31 MED ORDER — DOCUSATE SODIUM 100 MG PO CAPS: 100.0000 mg | ORAL_CAPSULE | Freq: Every day | ORAL | 2 refills | Status: DC | PRN

## 2023-07-31 MED ORDER — OXYCODONE-ACETAMINOPHEN 5-325 MG PO TABS: 1.0000 | ORAL_TABLET | Freq: Four times a day (QID) | ORAL | 0 refills | Status: DC | PRN

## 2023-07-31 MED ORDER — ONDANSETRON HCL 4 MG PO TABS: 4.0000 mg | ORAL_TABLET | Freq: Three times a day (TID) | ORAL | 0 refills | Status: DC | PRN

## 2023-07-31 MED ORDER — DOXYCYCLINE HYCLATE 100 MG PO CAPS: 100.0000 mg | ORAL_CAPSULE | Freq: Two times a day (BID) | ORAL | 0 refills | Status: DC

## 2023-07-31 MED ORDER — METHOCARBAMOL 750 MG PO TABS: 750.0000 mg | ORAL_TABLET | Freq: Three times a day (TID) | ORAL | 2 refills | Status: DC | PRN

## 2023-07-31 NOTE — Telephone Encounter (Signed)
 Josh from the pharmacy called. Would like you to know that patient takes morphine 6x a day also takes Zanex 1mg  4x a day. Need clarification on prescribing medication. [phone]

## 2023-07-31 NOTE — Progress Notes (Signed)
 Iv vanc ordered and po doxy sent in

## 2023-07-31 NOTE — Telephone Encounter (Signed)
 Ohhh, good to know.  Please do not fill percocet and muscle relaxer

## 2023-07-31 NOTE — Telephone Encounter (Signed)
 Notified pharmacy.

## 2023-07-31 NOTE — Progress Notes (Signed)
 Anesthesia Chart Review:  63 year old male with pertinent history including tobacco abuse, aortic atherosclerosis noted on chest CT, OSA on CPAP with inconsistent compliance, GERD, HTN, HLD, polycythemia vera, chronic pain.  Dr. Roda Shutters referred the patient for preoperative cardiology risk stratification.  He was seen by Dr. Clifton Tyhesha Dutson on 05/15/2023.  Per note, "He is limited by his hip pain. He will need right hip replacement. EKG today with sinus and incomplete RBBB, unchanged from January 2024. CT chest 05/12/23 with evidence of aortic atherosclerosis. No coronary artery calcium is noted. Dilated pulmonary artery suggesting pulmonary HTN. I will arrange an echocardiogram to assess LV size and function and assess PA pressures. If his echo is ok, can give him clearance to proceed with his planned hip surgery."  Subsequent echocardiogram 06/18/2023 showed EF 55 to 60%, normal wall motion, normal RV systolic function, no significant valvular abnormalities.  Follows with hematologist Dr. 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  He undergoes phlebotomy to maintain hematocrit below 45%.  He was last seen in follow-up on 07/12/2023, upcoming hip surgery discussed.  Per note, Dr. Myna Hidalgo prescribed patient Xarelto to take in the perioperative period.  He did subsequently receive phlebotomy on 07/12/2023.  Patient reports last dose of Xarelto 08/04/2023.  Preop labs reviewed, unremarkable.  EKG 05/15/23: NSR.  Rate 70.  Incomplete right bundle branch block.  TTE 06/18/2023: 1. Left ventricular ejection fraction, by estimation, is 55 to 60%. The  left ventricle has normal function. The left ventricle has no regional  wall motion abnormalities. Left ventricular diastolic parameters were  normal.   2. Right ventricular systolic function is normal. The right ventricular  size is normal.   3. The mitral valve is normal in structure. No evidence of mitral valve  regurgitation. No evidence of mitral stenosis.   4. The  aortic valve is grossly normal. Aortic valve regurgitation is not  visualized. No aortic stenosis is present.   5. The inferior vena cava is normal in size with greater than 50%  respiratory variability, suggesting right atrial pressure of 3 mmHg.     Zannie Cove [facility] Short Stay Center/Anesthesiology Phone [phone] 07/31/2023 1:52 PM

## 2023-07-31 NOTE — Anesthesia Preprocedure Evaluation (Addendum)
 Anesthesia Evaluation  Patient identified by MRN, date of birth, ID band Patient awake    Reviewed: Allergy & Precautions, NPO status , Patient's Chart, lab work & pertinent test results  History of Anesthesia Complications Negative for: history of anesthetic complications  Airway Mallampati: II  TM Distance: >3 FB Neck ROM: Full    Dental no notable dental hx. (+) Dental Advisory Given   Pulmonary sleep apnea , pneumonia, COPD, Current Smoker, former smoker   Pulmonary exam normal        Cardiovascular hypertension, Normal cardiovascular exam     Neuro/Psych  PSYCHIATRIC DISORDERS Anxiety Depression       GI/Hepatic Neg liver ROS,GERD  ,,  Endo/Other  negative endocrine ROS    Renal/GU Renal diseasenegative Renal ROS     Musculoskeletal   Abdominal   Peds  Hematology  (+) Blood dyscrasia, anemia Polycythemia vera   Anesthesia Other Findings   Reproductive/Obstetrics                             Anesthesia Physical Anesthesia Plan  ASA: 3  Anesthesia Plan: MAC and Spinal   Post-op Pain Management: Minimal or no pain anticipated and Ofirmev IV (intra-op)*   Induction: Intravenous  PONV Risk Score and Plan: Ondansetron, Dexamethasone, Treatment may vary due to age or medical condition and Propofol infusion  Airway Management Planned: Natural Airway, Simple Face Mask and Mask  Additional Equipment: None  Intra-op Plan:   Post-operative Plan: Extubation in OR  Informed Consent: I have reviewed the patients History and Physical, chart, labs and discussed the procedure including the risks, benefits and alternatives for the proposed anesthesia with the patient or authorized representative who has indicated his/her understanding and acceptance.     Dental advisory given  Plan Discussed with: CRNA, Anesthesiologist and Surgeon  Anesthesia Plan Comments: (PAT note by Antionette Poles,  PA-C: 63 year old male with pertinent history including tobacco abuse, aortic atherosclerosis noted on chest CT, OSA on CPAP with inconsistent compliance, GERD, HTN, HLD, polycythemia vera, chronic pain.  Dr. Roda Shutters referred the patient for preoperative cardiology risk stratification.  He was seen by Dr. Clifton James on 05/15/2023.  Per note, "He is limited by his hip pain. He will need right hip replacement. EKG today with sinus and incomplete RBBB, unchanged from January 2024. CT chest 05/12/23 with evidence of aortic atherosclerosis. No coronary artery calcium is noted. Dilated pulmonary artery suggesting pulmonary HTN. I will arrange an echocardiogram to assess LV size and function and assess PA pressures. If his echo is ok, can give him clearance to proceed with his planned hip surgery."  Subsequent echocardiogram 06/18/2023 showed EF 55 to 60%, normal wall motion, normal RV systolic function, no significant valvular abnormalities.  Follows with hematologist Dr. 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  He undergoes phlebotomy to maintain hematocrit below 45%.  He was last seen in follow-up on 07/12/2023, upcoming hip surgery discussed.  Per note, Dr. Myna Hidalgo prescribed patient Xarelto to take in the perioperative period.  He did subsequently receive phlebotomy on 07/12/2023.  Patient reports last dose of Xarelto 08/04/2023.  Preop labs reviewed, unremarkable.  EKG 05/15/23: NSR.  Rate 70.  Incomplete right bundle branch block.  TTE 06/18/2023: 1. Left ventricular ejection fraction, by estimation, is 55 to 60%. The  left ventricle has normal function. The left ventricle has no regional  wall motion abnormalities. Left ventricular diastolic parameters were  normal.  2. Right ventricular systolic function is  normal. The right ventricular  size is normal.  3. The mitral valve is normal in structure. No evidence of mitral valve  regurgitation. No evidence of mitral stenosis.  4. The aortic valve is  grossly normal. Aortic valve regurgitation is not  visualized. No aortic stenosis is present.  5. The inferior vena cava is normal in size with greater than 50%  respiratory variability, suggesting right atrial pressure of 3 mmHg.   )        Anesthesia Quick Evaluation

## 2023-08-06 ENCOUNTER — Other Ambulatory Visit: Payer: Self-pay

## 2023-08-06 DIAGNOSIS — Z122 Encounter for screening for malignant neoplasm of respiratory organs: Secondary | ICD-10-CM

## 2023-08-06 DIAGNOSIS — Z87891 Personal history of nicotine dependence: Secondary | ICD-10-CM

## 2023-08-06 DIAGNOSIS — F17210 Nicotine dependence, cigarettes, uncomplicated: Secondary | ICD-10-CM

## 2023-08-08 ENCOUNTER — Ambulatory Visit: Payer: Medicare PPO | Admitting: Pulmonary Disease

## 2023-08-08 MED ORDER — TRANEXAMIC ACID 1000 MG/10ML IV SOLN
2000.0000 mg | INTRAVENOUS | Status: DC
  Filled 2023-08-08: qty 20

## 2023-08-09 ENCOUNTER — Other Ambulatory Visit: Payer: Self-pay | Admitting: Orthopaedic Surgery

## 2023-08-09 ENCOUNTER — Other Ambulatory Visit (HOSPITAL_COMMUNITY): Payer: Self-pay

## 2023-08-09 ENCOUNTER — Ambulatory Visit (HOSPITAL_COMMUNITY): Payer: Self-pay | Admitting: Anesthesiology

## 2023-08-09 ENCOUNTER — Observation Stay (HOSPITAL_COMMUNITY)
Admission: RE | Admit: 2023-08-09 | Discharge: 2023-08-10 | Disposition: A | Discharge status: Home Health | Payer: Medicare PPO | Attending: Orthopaedic Surgery | Admitting: Orthopaedic Surgery

## 2023-08-09 ENCOUNTER — Other Ambulatory Visit: Payer: Self-pay

## 2023-08-09 ENCOUNTER — Encounter (HOSPITAL_COMMUNITY)
Admission: RE | Disposition: A | Discharge status: Home Health | Payer: Self-pay | Source: Home / Self Care | Attending: Orthopaedic Surgery

## 2023-08-09 ENCOUNTER — Ambulatory Visit (HOSPITAL_COMMUNITY): Payer: Self-pay | Admitting: Physician Assistant

## 2023-08-09 ENCOUNTER — Observation Stay (HOSPITAL_COMMUNITY)

## 2023-08-09 ENCOUNTER — Encounter: Payer: Self-pay | Admitting: Hematology & Oncology

## 2023-08-09 ENCOUNTER — Ambulatory Visit (HOSPITAL_COMMUNITY)

## 2023-08-09 ENCOUNTER — Encounter (HOSPITAL_COMMUNITY): Payer: Self-pay | Admitting: Orthopaedic Surgery

## 2023-08-09 DIAGNOSIS — Z79899 Other long term (current) drug therapy: Secondary | ICD-10-CM | POA: Insufficient documentation

## 2023-08-09 DIAGNOSIS — M1611 Unilateral primary osteoarthritis, right hip: Secondary | ICD-10-CM | POA: Diagnosis present

## 2023-08-09 DIAGNOSIS — J449 Chronic obstructive pulmonary disease, unspecified: Secondary | ICD-10-CM

## 2023-08-09 DIAGNOSIS — I10 Essential (primary) hypertension: Secondary | ICD-10-CM | POA: Insufficient documentation

## 2023-08-09 DIAGNOSIS — Z96641 Presence of right artificial hip joint: Secondary | ICD-10-CM

## 2023-08-09 DIAGNOSIS — F17210 Nicotine dependence, cigarettes, uncomplicated: Secondary | ICD-10-CM | POA: Insufficient documentation

## 2023-08-09 LAB — MRSA NEXT GEN BY PCR, NASAL: MRSA by PCR Next Gen: DETECTED — AB

## 2023-08-09 SURGERY — ARTHROPLASTY, HIP, TOTAL, ANTERIOR APPROACH
Anesthesia: Monitor Anesthesia Care | Site: Hip | Laterality: Right

## 2023-08-09 MED ORDER — TRANEXAMIC ACID 1000 MG/10ML IV SOLN
INTRAVENOUS | Status: DC | PRN
  Administered 2023-08-09: 2000 mg via TOPICAL

## 2023-08-09 MED ORDER — PROPOFOL 10 MG/ML IV BOLUS
INTRAVENOUS | Status: AC
  Filled 2023-08-09: qty 20

## 2023-08-09 MED ORDER — FERROUS SULFATE 325 (65 FE) MG PO TABS
325.0000 mg | ORAL_TABLET | Freq: Three times a day (TID) | ORAL | Status: DC
Start: 2023-08-09 — End: 2023-08-10
  Administered 2023-08-09 – 2023-08-10 (×2): 325 mg via ORAL
  Filled 2023-08-09 (×2): qty 1

## 2023-08-09 MED ORDER — DOCUSATE SODIUM 100 MG PO CAPS
100.0000 mg | ORAL_CAPSULE | Freq: Two times a day (BID) | ORAL | Status: DC
  Administered 2023-08-09 – 2023-08-10 (×2): 100 mg via ORAL
  Filled 2023-08-09 (×2): qty 1

## 2023-08-09 MED ORDER — METHOCARBAMOL 1000 MG/10ML IJ SOLN: 500.0000 mg | Freq: Four times a day (QID) | INTRAMUSCULAR | Status: DC | PRN

## 2023-08-09 MED ORDER — OXYCODONE-ACETAMINOPHEN 5-325 MG PO TABS
1.0000 | ORAL_TABLET | Freq: Three times a day (TID) | ORAL | 0 refills | Status: DC | PRN
  Filled 2023-08-09: qty 30, 10d supply, fill #0

## 2023-08-09 MED ORDER — BUPIVACAINE-MELOXICAM ER 400-12 MG/14ML IJ SOLN
INTRAMUSCULAR | Status: AC
  Filled 2023-08-09: qty 1

## 2023-08-09 MED ORDER — ACETAMINOPHEN 160 MG/5ML PO SOLN: 325.0000 mg | ORAL | Status: DC | PRN

## 2023-08-09 MED ORDER — TRANEXAMIC ACID-NACL 1000-0.7 MG/100ML-% IV SOLN
1000.0000 mg | Freq: Once | INTRAVENOUS | Status: AC
Start: 2023-08-09 — End: 2023-08-10
  Administered 2023-08-09: 1000 mg via INTRAVENOUS
  Filled 2023-08-09: qty 100

## 2023-08-09 MED ORDER — DEXAMETHASONE SODIUM PHOSPHATE 10 MG/ML IJ SOLN
INTRAMUSCULAR | Status: AC
  Filled 2023-08-09: qty 1

## 2023-08-09 MED ORDER — METHOCARBAMOL 500 MG PO TABS
500.0000 mg | ORAL_TABLET | Freq: Four times a day (QID) | ORAL | Status: DC | PRN
  Administered 2023-08-09: 500 mg via ORAL
  Filled 2023-08-09 (×2): qty 1

## 2023-08-09 MED ORDER — CHLORHEXIDINE GLUCONATE 0.12 % MT SOLN
15.0000 mL | Freq: Once | OROMUCOSAL | Status: AC
  Administered 2023-08-09: 15 mL via OROMUCOSAL
  Filled 2023-08-09: qty 15

## 2023-08-09 MED ORDER — ACETAMINOPHEN 500 MG PO TABS
1000.0000 mg | ORAL_TABLET | Freq: Four times a day (QID) | ORAL | Status: AC
  Administered 2023-08-09 – 2023-08-10 (×3): 1000 mg via ORAL
  Filled 2023-08-09 (×3): qty 2

## 2023-08-09 MED ORDER — ONDANSETRON HCL 4 MG PO TABS: 4.0000 mg | ORAL_TABLET | Freq: Four times a day (QID) | ORAL | Status: DC | PRN

## 2023-08-09 MED ORDER — METOCLOPRAMIDE HCL 5 MG PO TABS: 5.0000 mg | ORAL_TABLET | Freq: Three times a day (TID) | ORAL | Status: DC | PRN

## 2023-08-09 MED ORDER — VANCOMYCIN HCL 1000 MG IV SOLR
INTRAVENOUS | Status: AC
  Filled 2023-08-09: qty 20

## 2023-08-09 MED ORDER — ALUM & MAG HYDROXIDE-SIMETH 200-200-20 MG/5ML PO SUSP: 30.0000 mL | ORAL | Status: DC | PRN

## 2023-08-09 MED ORDER — HYDROMORPHONE HCL 1 MG/ML IJ SOLN
0.5000 mg | INTRAMUSCULAR | Status: DC | PRN
  Administered 2023-08-09 – 2023-08-10 (×3): 1 mg via INTRAVENOUS
  Filled 2023-08-09 (×3): qty 1

## 2023-08-09 MED ORDER — RIVAROXABAN 10 MG PO TABS
10.0000 mg | ORAL_TABLET | Freq: Every day | ORAL | Status: DC
  Administered 2023-08-10: 10 mg via ORAL
  Filled 2023-08-09: qty 1

## 2023-08-09 MED ORDER — ONDANSETRON HCL 4 MG PO TABS
4.0000 mg | ORAL_TABLET | Freq: Three times a day (TID) | ORAL | 0 refills | Status: DC | PRN
  Filled 2023-08-09: qty 20, 7d supply, fill #0

## 2023-08-09 MED ORDER — MIDAZOLAM HCL 2 MG/2ML IJ SOLN
INTRAMUSCULAR | Status: AC
  Filled 2023-08-09: qty 2

## 2023-08-09 MED ORDER — PROPOFOL 500 MG/50ML IV EMUL
INTRAVENOUS | Status: DC | PRN
  Administered 2023-08-09: 50 ug/kg/min via INTRAVENOUS

## 2023-08-09 MED ORDER — OXYCODONE-ACETAMINOPHEN 5-325 MG PO TABS
1.0000 | ORAL_TABLET | Freq: Three times a day (TID) | ORAL | 0 refills | Status: DC | PRN
  Filled 2023-08-09: qty 40, 7d supply, fill #0

## 2023-08-09 MED ORDER — POLYETHYLENE GLYCOL 3350 17 G PO PACK
17.0000 g | PACK | Freq: Every day | ORAL | Status: DC
  Administered 2023-08-10: 17 g via ORAL
  Filled 2023-08-09: qty 1

## 2023-08-09 MED ORDER — OXYCODONE HCL ER 10 MG PO T12A
10.0000 mg | EXTENDED_RELEASE_TABLET | Freq: Two times a day (BID) | ORAL | Status: DC
  Administered 2023-08-09 – 2023-08-10 (×2): 10 mg via ORAL
  Filled 2023-08-09 (×2): qty 1

## 2023-08-09 MED ORDER — MORPHINE SULFATE 15 MG PO TABS: 30.0000 mg | ORAL_TABLET | ORAL | Status: DC | PRN

## 2023-08-09 MED ORDER — TRANEXAMIC ACID-NACL 1000-0.7 MG/100ML-% IV SOLN
1000.0000 mg | INTRAVENOUS | Status: AC
  Administered 2023-08-09: 1000 mg via INTRAVENOUS
  Filled 2023-08-09: qty 100

## 2023-08-09 MED ORDER — DIPHENHYDRAMINE HCL 12.5 MG/5ML PO ELIX: 25.0000 mg | ORAL_SOLUTION | ORAL | Status: DC | PRN

## 2023-08-09 MED ORDER — ORAL CARE MOUTH RINSE: 15.0000 mL | Freq: Once | OROMUCOSAL | Status: AC

## 2023-08-09 MED ORDER — FENTANYL CITRATE (PF) 250 MCG/5ML IJ SOLN
INTRAMUSCULAR | Status: AC
Start: 2023-08-09 — End: ?
  Filled 2023-08-09: qty 5

## 2023-08-09 MED ORDER — PHENYLEPHRINE HCL-NACL 20-0.9 MG/250ML-% IV SOLN
INTRAVENOUS | Status: DC | PRN
Start: 2023-08-09 — End: 2023-08-09
  Administered 2023-08-09: 50 ug/min via INTRAVENOUS

## 2023-08-09 MED ORDER — FENTANYL CITRATE (PF) 250 MCG/5ML IJ SOLN
INTRAMUSCULAR | Status: DC | PRN
Start: 2023-08-09 — End: 2023-08-09
  Administered 2023-08-09: 50 ug via INTRAVENOUS
  Administered 2023-08-09: 100 ug via INTRAVENOUS

## 2023-08-09 MED ORDER — ONDANSETRON HCL 4 MG/2ML IJ SOLN: 4.0000 mg | Freq: Once | INTRAMUSCULAR | Status: DC | PRN

## 2023-08-09 MED ORDER — CEFAZOLIN SODIUM-DEXTROSE 2-4 GM/100ML-% IV SOLN
2.0000 g | Freq: Four times a day (QID) | INTRAVENOUS | Status: AC
  Administered 2023-08-09 (×2): 2 g via INTRAVENOUS
  Filled 2023-08-09 (×2): qty 100

## 2023-08-09 MED ORDER — ONDANSETRON HCL 4 MG/2ML IJ SOLN
INTRAMUSCULAR | Status: DC | PRN
  Administered 2023-08-09: 4 mg via INTRAVENOUS

## 2023-08-09 MED ORDER — METHOCARBAMOL 750 MG PO TABS
750.0000 mg | ORAL_TABLET | Freq: Three times a day (TID) | ORAL | 2 refills | Status: DC | PRN
  Filled 2023-08-09: qty 30, 10d supply, fill #0
  Filled 2023-08-20: qty 30, 10d supply, fill #1

## 2023-08-09 MED ORDER — MENTHOL 3 MG MT LOZG: 1.0000 | LOZENGE | OROMUCOSAL | Status: DC | PRN

## 2023-08-09 MED ORDER — ACETAMINOPHEN 325 MG PO TABS: 325.0000 mg | ORAL_TABLET | ORAL | Status: DC | PRN

## 2023-08-09 MED ORDER — CHLORHEXIDINE GLUCONATE CLOTH 2 % EX PADS
6.0000 | MEDICATED_PAD | Freq: Every day | CUTANEOUS | Status: DC
  Administered 2023-08-09 – 2023-08-10 (×2): 6 via TOPICAL

## 2023-08-09 MED ORDER — VANCOMYCIN HCL 1 G IV SOLR
INTRAVENOUS | Status: DC | PRN
  Administered 2023-08-09: 1000 mg via TOPICAL

## 2023-08-09 MED ORDER — MEPERIDINE HCL 25 MG/ML IJ SOLN: 6.2500 mg | INTRAMUSCULAR | Status: DC | PRN

## 2023-08-09 MED ORDER — DOXYCYCLINE HYCLATE 100 MG PO TABS
100.0000 mg | ORAL_TABLET | Freq: Two times a day (BID) | ORAL | Status: DC
Start: 2023-08-10 — End: 2023-08-10
  Administered 2023-08-10: 100 mg via ORAL
  Filled 2023-08-09: qty 1

## 2023-08-09 MED ORDER — OXYCODONE HCL 5 MG PO TABS: 5.0000 mg | ORAL_TABLET | Freq: Once | ORAL | Status: DC | PRN

## 2023-08-09 MED ORDER — CHLORHEXIDINE GLUCONATE 4 % EX SOLN: 1.0000 | CUTANEOUS | 1 refills | Status: DC

## 2023-08-09 MED ORDER — MUPIROCIN 2 % EX OINT
1.0000 | TOPICAL_OINTMENT | Freq: Two times a day (BID) | CUTANEOUS | Status: DC
  Administered 2023-08-09 – 2023-08-10 (×2): 1 via NASAL
  Filled 2023-08-09 (×2): qty 22

## 2023-08-09 MED ORDER — OXYCODONE HCL 5 MG PO TABS
10.0000 mg | ORAL_TABLET | ORAL | Status: DC | PRN
  Administered 2023-08-09 – 2023-08-10 (×2): 15 mg via ORAL
  Filled 2023-08-09 (×2): qty 3
  Filled 2023-08-09: qty 2

## 2023-08-09 MED ORDER — DEXAMETHASONE SODIUM PHOSPHATE 10 MG/ML IJ SOLN
10.0000 mg | Freq: Once | INTRAMUSCULAR | Status: AC
  Administered 2023-08-10: 10 mg via INTRAVENOUS
  Filled 2023-08-09: qty 1

## 2023-08-09 MED ORDER — ONDANSETRON HCL 4 MG/2ML IJ SOLN: 4.0000 mg | Freq: Four times a day (QID) | INTRAMUSCULAR | Status: DC | PRN

## 2023-08-09 MED ORDER — VANCOMYCIN HCL 1500 MG/300ML IV SOLN
1500.0000 mg | Freq: Once | INTRAVENOUS | Status: AC
  Administered 2023-08-09: 1500 mg via INTRAVENOUS
  Filled 2023-08-09: qty 300

## 2023-08-09 MED ORDER — FENTANYL CITRATE (PF) 100 MCG/2ML IJ SOLN: 25.0000 ug | INTRAMUSCULAR | Status: DC | PRN

## 2023-08-09 MED ORDER — SORBITOL 70 % SOLN: 30.0000 mL | Freq: Every day | Status: DC | PRN

## 2023-08-09 MED ORDER — POVIDONE-IODINE 10 % EX SWAB
2.0000 | Freq: Once | CUTANEOUS | Status: AC
  Administered 2023-08-09: 2 via TOPICAL

## 2023-08-09 MED ORDER — LACTATED RINGERS IV SOLN: INTRAVENOUS | Status: DC | PRN

## 2023-08-09 MED ORDER — MAGNESIUM CITRATE PO SOLN: 1.0000 | Freq: Once | ORAL | Status: DC | PRN

## 2023-08-09 MED ORDER — PRONTOSAN WOUND IRRIGATION OPTIME
TOPICAL | Status: DC | PRN
  Administered 2023-08-09: 350 mL

## 2023-08-09 MED ORDER — DEXAMETHASONE SODIUM PHOSPHATE 10 MG/ML IJ SOLN
INTRAMUSCULAR | Status: DC | PRN
  Administered 2023-08-09: 10 mg via INTRAVENOUS

## 2023-08-09 MED ORDER — OXYCODONE HCL 5 MG PO TABS: 5.0000 mg | ORAL_TABLET | ORAL | Status: DC | PRN

## 2023-08-09 MED ORDER — METOCLOPRAMIDE HCL 5 MG/ML IJ SOLN: 5.0000 mg | Freq: Three times a day (TID) | INTRAMUSCULAR | Status: DC | PRN

## 2023-08-09 MED ORDER — 0.9 % SODIUM CHLORIDE (POUR BTL) OPTIME
TOPICAL | Status: DC | PRN
  Administered 2023-08-09: 1000 mL

## 2023-08-09 MED ORDER — BUPIVACAINE-MELOXICAM ER 400-12 MG/14ML IJ SOLN
INTRAMUSCULAR | Status: DC | PRN
  Administered 2023-08-09: 400 mg

## 2023-08-09 MED ORDER — OXYCODONE HCL 5 MG/5ML PO SOLN: 5.0000 mg | Freq: Once | ORAL | Status: DC | PRN

## 2023-08-09 MED ORDER — DOXYCYCLINE HYCLATE 100 MG PO TABS
100.0000 mg | ORAL_TABLET | Freq: Two times a day (BID) | ORAL | 0 refills | Status: DC
  Filled 2023-08-09: qty 20, 10d supply, fill #0

## 2023-08-09 MED ORDER — MIDAZOLAM HCL 2 MG/2ML IJ SOLN
INTRAMUSCULAR | Status: DC | PRN
  Administered 2023-08-09: 2 mg via INTRAVENOUS

## 2023-08-09 MED ORDER — PHENOL 1.4 % MT LIQD: 1.0000 | OROMUCOSAL | Status: DC | PRN

## 2023-08-09 MED ORDER — EPHEDRINE SULFATE-NACL 50-0.9 MG/10ML-% IV SOSY
PREFILLED_SYRINGE | INTRAVENOUS | Status: DC | PRN
  Administered 2023-08-09: 10 mg via INTRAVENOUS

## 2023-08-09 MED ORDER — BUPIVACAINE IN DEXTROSE 0.75-8.25 % IT SOLN
INTRATHECAL | Status: DC | PRN
  Administered 2023-08-09: 2 mL via INTRATHECAL

## 2023-08-09 MED ORDER — PHENYLEPHRINE 80 MCG/ML (10ML) SYRINGE FOR IV PUSH (FOR BLOOD PRESSURE SUPPORT)
PREFILLED_SYRINGE | INTRAVENOUS | Status: DC | PRN
  Administered 2023-08-09 (×2): 160 ug via INTRAVENOUS
  Administered 2023-08-09: 80 ug via INTRAVENOUS

## 2023-08-09 MED ORDER — SODIUM CHLORIDE 0.9 % IR SOLN
Status: DC | PRN
  Administered 2023-08-09: 1000 mL

## 2023-08-09 MED ORDER — CEFAZOLIN SODIUM-DEXTROSE 3-4 GM/150ML-% IV SOLN
3.0000 g | INTRAVENOUS | Status: AC
  Administered 2023-08-09: 3 g via INTRAVENOUS
  Filled 2023-08-09: qty 150

## 2023-08-09 MED ORDER — ONDANSETRON HCL 4 MG/2ML IJ SOLN
INTRAMUSCULAR | Status: AC
  Filled 2023-08-09: qty 2

## 2023-08-09 MED ORDER — MUPIROCIN 2 % EX OINT
1.0000 | TOPICAL_OINTMENT | Freq: Two times a day (BID) | CUTANEOUS | 2 refills | Status: DC
  Filled 2023-08-09: qty 22, 11d supply, fill #0

## 2023-08-09 MED ORDER — ACETAMINOPHEN 325 MG PO TABS: 325.0000 mg | ORAL_TABLET | Freq: Four times a day (QID) | ORAL | Status: DC | PRN

## 2023-08-09 MED ORDER — PANTOPRAZOLE SODIUM 40 MG PO TBEC
40.0000 mg | DELAYED_RELEASE_TABLET | Freq: Every day | ORAL | Status: DC
  Administered 2023-08-10: 40 mg via ORAL
  Filled 2023-08-09: qty 1

## 2023-08-09 MED ORDER — MUPIROCIN 2 % EX OINT: 1.0000 | TOPICAL_OINTMENT | Freq: Two times a day (BID) | CUTANEOUS | 0 refills | Status: AC

## 2023-08-09 SURGICAL SUPPLY — 60 items
ACETAB CUP W/GRIPTION 54 (Plate) ×1 IMPLANT
BAG COUNTER SPONGE SURGICOUNT (BAG) ×1 IMPLANT
BAG DECANTER FOR FLEXI CONT (MISCELLANEOUS) ×1 IMPLANT
BLADE SAG 18X100X1.27 (BLADE) ×1 IMPLANT
CANISTER WOUNDNEG PRESSURE 500 (CANNISTER) IMPLANT
COVER PERINEAL POST (MISCELLANEOUS) ×1 IMPLANT
COVER SURGICAL LIGHT HANDLE (MISCELLANEOUS) ×1 IMPLANT
CUP ACETAB W/GRIPTION 54 (Plate) IMPLANT
DERMABOND ADVANCED .7 DNX12 (GAUZE/BANDAGES/DRESSINGS) IMPLANT
DRAPE C-ARM 42X72 X-RAY (DRAPES) ×1 IMPLANT
DRAPE POUCH INSTRU U-SHP 10X18 (DRAPES) ×1 IMPLANT
DRAPE STERI IOBAN 125X83 (DRAPES) ×1 IMPLANT
DRAPE U-SHAPE 47X51 STRL (DRAPES) ×2 IMPLANT
DRESSING PEEL AND PLC PRVNA 13 (GAUZE/BANDAGES/DRESSINGS) IMPLANT
DRSG AQUACEL AG ADV 3.5X10 (GAUZE/BANDAGES/DRESSINGS) ×1 IMPLANT
DRSG PEEL AND PLACE PREVENA 13 (GAUZE/BANDAGES/DRESSINGS) ×1 IMPLANT
DURAPREP 26ML APPLICATOR (WOUND CARE) ×2 IMPLANT
ELECT BLADE 4.0 EZ CLEAN MEGAD (MISCELLANEOUS) ×1 IMPLANT
ELECT REM PT RETURN 9FT ADLT (ELECTROSURGICAL) ×1 IMPLANT
ELECTRODE BLDE 4.0 EZ CLN MEGD (MISCELLANEOUS) ×1 IMPLANT
ELECTRODE REM PT RTRN 9FT ADLT (ELECTROSURGICAL) ×1 IMPLANT
FEM STEM 12/14 TAPER SZ 4 HIP (Orthopedic Implant) ×1 IMPLANT
FEMORAL STEM 12/14 TPR SZ4 HIP (Orthopedic Implant) IMPLANT
GLOVE BIOGEL PI IND STRL 7.0 (GLOVE) ×2 IMPLANT
GLOVE BIOGEL PI IND STRL 7.5 (GLOVE) ×5 IMPLANT
GLOVE ECLIPSE 7.0 STRL STRAW (GLOVE) ×2 IMPLANT
GLOVE SKINSENSE STRL SZ7.5 (GLOVE) ×1 IMPLANT
GLOVE SURG SYN 7.5 E (GLOVE) ×2 IMPLANT
GLOVE SURG SYN 7.5 PF PI (GLOVE) ×2 IMPLANT
GLOVE SURG UNDER POLY LF SZ7 (GLOVE) ×3 IMPLANT
GLOVE SURG UNDER POLY LF SZ7.5 (GLOVE) ×2 IMPLANT
GOWN STRL REUS W/ TWL LRG LVL3 (GOWN DISPOSABLE) IMPLANT
GOWN STRL REUS W/ TWL XL LVL3 (GOWN DISPOSABLE) ×1 IMPLANT
GOWN STRL SURGICAL XL XLNG (GOWN DISPOSABLE) ×1 IMPLANT
GOWN TOGA ZIPPER T7+ PEEL AWAY (MISCELLANEOUS) ×1 IMPLANT
HEAD CERAMIC DELTA 28M 12/14P5 (Head) IMPLANT
HOOD PEEL AWAY T7 (MISCELLANEOUS) ×1 IMPLANT
IV NS IRRIG 3000ML ARTHROMATIC (IV SOLUTION) ×1 IMPLANT
KIT BASIN OR (CUSTOM PROCEDURE TRAY) ×1 IMPLANT
KIT DRSG PREVENA PLUS 7DAY 125 (MISCELLANEOUS) IMPLANT
LINER DM PINNACLE 54/47 (Liner) IMPLANT
LINER FEM BM HIP ALTRX 47X28 (Liner) IMPLANT
MARKER SKIN DUAL TIP RULER LAB (MISCELLANEOUS) ×1 IMPLANT
NDL SPNL 18GX3.5 QUINCKE PK (NEEDLE) ×1 IMPLANT
NEEDLE SPNL 18GX3.5 QUINCKE PK (NEEDLE) ×1 IMPLANT
PACK TOTAL JOINT (CUSTOM PROCEDURE TRAY) ×1 IMPLANT
PACK UNIVERSAL I (CUSTOM PROCEDURE TRAY) ×1 IMPLANT
SCREW 6.5MMX25MM (Screw) IMPLANT
SET HNDPC FAN SPRY TIP SCT (DISPOSABLE) ×1 IMPLANT
SOLUTION PRONTOSAN WOUND 350ML (IRRIGATION / IRRIGATOR) ×1 IMPLANT
SUT ETHIBOND 2 V 37 (SUTURE) ×1 IMPLANT
SUT VIC AB 0 CT1 27XBRD ANBCTR (SUTURE) ×1 IMPLANT
SUT VIC AB 1 CTX36XBRD ANBCTR (SUTURE) ×1 IMPLANT
SUT VIC AB 2-0 CT1 TAPERPNT 27 (SUTURE) ×2 IMPLANT
SYR 50ML LL SCALE MARK (SYRINGE) ×1 IMPLANT
TOWEL GREEN STERILE (TOWEL DISPOSABLE) ×1 IMPLANT
TRAY CATH INTERMITTENT SS 16FR (CATHETERS) IMPLANT
TRAY FOLEY W/BAG SLVR 16FR ST (SET/KITS/TRAYS/PACK) IMPLANT
TUBE SUCT ARGYLE STRL (TUBING) ×1 IMPLANT
YANKAUER SUCT BULB TIP NO VENT (SUCTIONS) ×1 IMPLANT

## 2023-08-09 NOTE — Evaluation (Addendum)
 Physical Therapy Evaluation Patient Details Name: Jacob Bradford MRN: [id number] DOB: 1961/05/07 Today's Date: 08/09/2023  History of Present Illness  63 y.o. male presents to [facility] hospital on 08/09/2023 for elective R THA. PMH includes COPD, lumbar fusion, polycythemia vera.  Clinical Impression  Pt is a 63 y.o. male presenting on 08/09/23 with above conditions and deficits below, see PT Problem List. PTA pt used a SPC for mobility and lived in a mobile home with his son. Currently pt is mod I for bed mobility and CGA for transfers and ambulation with RW. Pt displays deficits in LE strength & AROM, functional mobility, pain, gait, balance, and activity tolerance and would benefit from continued acute PT care until d/c. Additionally, acute PT will target functional mobility, transfers, stairs, and gait to promote safe d/c home. Pt HEP handout and education provided and questions answered. Will continue to follow acutely.          If plan is discharge home, recommend the following: A little help with walking and/or transfers;A little help with bathing/dressing/bathroom;Assistance with cooking/housework;Assist for transportation;Help with stairs or ramp for entrance   Can travel by private vehicle        Equipment Recommendations None recommended by PT  Recommendations for Other Services       Functional Status Assessment Patient has had a recent decline in their functional status and demonstrates the ability to make significant improvements in function in a reasonable and predictable amount of time.     Precautions / Restrictions Precautions Precautions: Fall Recall of Precautions/Restrictions: Intact Restrictions Weight Bearing Restrictions Per Provider Order: No      Mobility  Bed Mobility Overal bed mobility: Needs Assistance Bed Mobility: Supine to Sit     Supine to sit: Modified independent (Device/Increase time), HOB elevated     General bed mobility comments: Pt performs  supine to sit with HOB elevated and uses anterior momentum.    Transfers Overall transfer level: Needs assistance Equipment used: Rolling walker (2 wheels) Transfers: Sit to/from Stand Sit to Stand: Contact guard assist           General transfer comment: pt requires additional time, extra trunk flexion, and cueing for hand placement.    Ambulation/Gait Ambulation/Gait assistance: Contact guard assist Gait Distance (Feet): 80 Feet Assistive device: Rolling walker (2 wheels) Gait Pattern/deviations: Step-to pattern, Decreased stride length, Shuffle, Trunk flexed Gait velocity: reduced Gait velocity interpretation: 1.31 - 2.62 ft/sec, indicative of limited community ambulator   General Gait Details: pt walks with short, shuffling, step-to-pattern, reliant heavily on RW during stance phase on R LE.  Stairs            Wheelchair Mobility     Tilt Bed    Modified Rankin (Stroke Patients Only)       Balance Overall balance assessment: Needs assistance Sitting-balance support: No upper extremity supported, Feet supported Sitting balance-Leahy Scale: Good Sitting balance - Comments: pt sits EOB without UE support. No LOB   Standing balance support: Reliant on assistive device for balance, During functional activity, Bilateral upper extremity supported Standing balance-Leahy Scale: Poor Standing balance comment: pt reliant on RW for standing balance and initially stands with increased trunk flexion                             Pertinent Vitals/Pain Pain Assessment Pain Assessment: Faces Faces Pain Scale: Hurts little more Pain Location: R hip Pain Descriptors / Indicators: Grimacing, Guarding Pain  Intervention(s): Monitored during session    Home Living Family/patient expects to be discharged to:: Private residence Living Arrangements: Children (lives with son) Available Help at Discharge: Family;Available 24 hours/day (son) Type of Home: Mobile  home Home Access: Ramped entrance       Home Layout: One level Home Equipment: Agricultural consultant (2 wheels);Cane - single point;Shower seat - built in;Grab bars - tub/shower;Hand held shower head Additional Comments: pt sleeps in bed or recliner at home. In the process of remodeling another bathroom to have a higher toilet    Prior Function Prior Level of Function : Independent/Modified Independent;Needs assist       Physical Assist : Mobility (physical);ADLs (physical) Mobility (physical): Gait;Stairs ADLs (physical): IADLs Mobility Comments: Ambulates with SPC. Uses ramped entrance of house ADLs Comments: Pt's son provides assistance for most household chores and activities. Pt is able to drive but lets son do the majority of driving     Extremity/Trunk Assessment   Upper Extremity Assessment Upper Extremity Assessment: Overall WFL for tasks assessed    Lower Extremity Assessment Lower Extremity Assessment: Generalized weakness    Cervical / Trunk Assessment Cervical / Trunk Assessment: Kyphotic  Communication   Communication Communication: No apparent difficulties    Cognition Arousal: Alert Behavior During Therapy: WFL for tasks assessed/performed   PT - Cognitive impairments: No apparent impairments                         Following commands: Intact       Cueing Cueing Techniques: Verbal cues     General Comments      Exercises     Assessment/Plan    PT Assessment Patient needs continued PT services  PT Problem List Decreased strength;Decreased range of motion;Decreased activity tolerance;Decreased balance;Decreased mobility;Decreased coordination;Cardiopulmonary status limiting activity;Pain       PT Treatment Interventions DME instruction;Gait training;Stair training;Functional mobility training;Therapeutic activities;Therapeutic exercise;Balance training;Neuromuscular re-education;Patient/family education    PT Goals (Current goals can be  found in the Care Plan section)  Acute Rehab PT Goals Patient Stated Goal: return home PT Goal Formulation: With patient/family Time For Goal Achievement: 08/23/23 Potential to Achieve Goals: Good    Frequency 7X/week     Co-evaluation               AM-PAC PT "6 Clicks" Mobility  Outcome Measure Help needed turning from your back to your side while in a flat bed without using bedrails?: A Little Help needed moving from lying on your back to sitting on the side of a flat bed without using bedrails?: A Little Help needed moving to and from a bed to a chair (including a wheelchair)?: A Little Help needed standing up from a chair using your arms (e.g., wheelchair or bedside chair)?: A Little Help needed to walk in hospital room?: A Little Help needed climbing 3-5 steps with a railing? : A Lot 6 Click Score: 17    End of Session Equipment Utilized During Treatment: Gait belt Activity Tolerance: Patient tolerated treatment well Patient left: in chair;with call bell/phone within reach;with chair alarm set;with family/visitor present Nurse Communication: Mobility status PT Visit Diagnosis: Other abnormalities of gait and mobility (R26.89);Muscle weakness (generalized) (M62.81);Difficulty in walking, not elsewhere classified (R26.2);Pain Pain - Right/Left: Right Pain - part of body: Leg    Time: 4098-1191 PT Time Calculation (min) (ACUTE ONLY): 27 min   Charges:   PT Evaluation $PT Eval Low Complexity: 1 Low   PT General Charges $$  ACUTE PT VISIT: 1 Visit         [address], SPT   [place] 08/09/2023, 5:40 PM

## 2023-08-09 NOTE — Discharge Instructions (Signed)

## 2023-08-09 NOTE — H&P (Signed)
 PREOPERATIVE H&P  Chief Complaint: right hip osteoarthritis  HPI: Jacob Bradford is a 63 y.o. male who presents for surgical treatment of right hip osteoarthritis.  He denies any changes in medical history.  Past Surgical History:  Procedure Laterality Date  . COLONOSCOPY     age 37- 3 benign polyps per pt   . MAXIMUM ACCESS (MAS)POSTERIOR LUMBAR INTERBODY FUSION (PLIF) 2 LEVEL N/A 03/07/2016   Procedure: LUMBAR FOUR-FIVE,LUMBAR FIVE-SACRAL ONE MAXIMUM ACCESS (MAS) POSTERIOR LUMBAR INTERBODY FUSION (PLIF) 2 LEVEL;  Surgeon: Tia Alert, MD;  Location: [facility] OR;  Service: Neurosurgery;  Laterality: N/A;  . TONSILLECTOMY     and Adenoid   Social History   Socioeconomic History  . Marital status: Widowed    Spouse name: Not on file  . Number of children: 2  . Years of education: Not on file  . Highest education level: Not on file  Occupational History  . Occupation: Disabled since 2019  Tobacco Use  . Smoking status: Every Day    Current packs/day: 0.00    Average packs/day: 0.5 packs/day for 45.0 years (22.5 ttl pk-yrs)    Types: Cigarettes    Start date: 07/14/1979    Last attempt to quit: 07/02/2021    Years since quitting: 2.1  . Smokeless tobacco: Never  . Tobacco comments:    Patient smoking a pack a day .   Vaping Use  . Vaping status: Former  Substance and Sexual Activity  . Alcohol use: Yes    Alcohol/week: 0.0 standard drinks of alcohol    Comment: 2-3  drinks a month if this- rare   . Drug use: No    Comment: THC gummy x 1  . Sexual activity: Yes  Other Topics Concern  . Not on file  Social History Narrative  . Not on file   Social Drivers of Health   Financial Resource Strain: Not on file  Food Insecurity: Not on file  Transportation Needs: Not on file  Physical Activity: Not on file  Stress: Not on file  Social Connections: Not on file   Family History  Problem Relation Age of Onset  . Heart disease Father   . Colon cancer Neg Hx   . Colon  polyps Neg Hx   . Esophageal cancer Neg Hx   . Rectal cancer Neg Hx   . Stomach cancer Neg Hx    No Known Allergies Prior to Admission medications   Medication Sig Start Date End Date Taking? Authorizing Provider  albuterol (PROVENTIL) (2.5 MG/3ML) 0.083% nebulizer solution Take 2.5 mg by nebulization as needed for wheezing or shortness of breath. 06/18/22  Yes [provider]  albuterol (VENTOLIN HFA) 108 (90 Base) MCG/ACT inhaler INHALE 2 PUFFS EVERY 6 HOURS AS NEEDED FOR WHEEZING OR SHORTNESS OF BREATH 05/08/23  Yes Olalere, Adewale A, MD  ALPRAZolam (XANAX) 1 MG tablet Take 1 tablet (1 mg total) by mouth 3 (three) times daily as needed for anxiety. 11/17/15  Yes Ennever, Rose Phi, MD  Aspirin-Acetaminophen (GOODYS BACK & BODY PAIN PO) Take 1 packet by mouth 3 (three) times daily as needed (pain).   Yes [provider]  calcium carbonate (TUMS - DOSED IN MG ELEMENTAL CALCIUM) 500 MG chewable tablet Chew 1,000 mg by mouth as needed for indigestion or heartburn.   Yes [provider]  docusate sodium (COLACE) 100 MG capsule Take 1 capsule (100 mg total) by mouth daily as needed. 07/31/23 07/30/24  Cristie Hem, PA-C  losartan-hydrochlorothiazide (HYZAAR) 100-12.5 MG tablet Take 1 tablet by mouth daily. 11/04/19  Yes [provider]  methocarbamol (ROBAXIN-750) 750 MG tablet Take 1 tablet (750 mg total) by mouth 3 (three) times daily as needed for muscle spasms. 07/31/23   Cristie Hem, PA-C  morphine (MSIR) 30 MG tablet Take 1 tablet (30 mg total) by mouth every 4 (four) hours as needed for severe pain (pain score 7-10). 07/17/23  Yes Ennever, Rose Phi, MD  naloxone [facility]) nasal spray 4 mg/0.1 mL Place 0.4 mg into the nose once.   Yes [provider]  omeprazole (PRILOSEC) 20 MG capsule Take 20 mg by mouth daily as needed (heartburn). 04/16/23  Yes [provider]  ondansetron (ZOFRAN) 4 MG tablet Take 1 tablet (4 mg total) by mouth every 8  (eight) hours as needed for nausea or vomiting. 07/31/23   Cristie Hem, PA-C  oxyCODONE-acetaminophen (PERCOCET) 5-325 MG tablet Take 1-2 tablets by mouth every 6 (six) hours as needed. To be taken after surgery 07/31/23   Cristie Hem, PA-C  rivaroxaban (XARELTO) 10 MG TABS tablet Take 1 tablet (10 mg total) by mouth daily. Take starting 2 weeks prior to surgery date, stop taking 2 days before surgery and restart 2 days after surgery for 1 month. 04/30/23  Yes Ennever, Rose Phi, MD  Tiotropium Bromide Monohydrate (SPIRIVA RESPIMAT) 2.5 MCG/ACT AERS INHALE TWO PUFFS DAILY 05/06/23  Yes Olalere, Adewale A, MD  aspirin 81 MG tablet Take 1 tablet (81 mg total) by mouth daily. Patient not taking: Reported on 07/30/2023 09/25/13   Josph Macho, MD  doxycycline (VIBRAMYCIN) 100 MG capsule Take 1 capsule (100 mg total) by mouth 2 (two) times daily. To be taken after surgery 07/31/23   Cristie Hem, PA-C     Positive ROS: All other systems have been reviewed and were otherwise negative with the exception of those mentioned in the HPI and as above.  Physical Exam: General: Alert, no acute distress Cardiovascular: No pedal edema Respiratory: No cyanosis, no use of accessory musculature GI: abdomen soft Skin: No lesions in the area of chief complaint Neurologic: Sensation intact distally Psychiatric: Patient is competent for consent with normal mood and affect Lymphatic: no lymphedema  MUSCULOSKELETAL: exam stable  Assessment: right hip osteoarthritis  Plan: Plan for Procedure(s): RIGHT TOTAL HIP ARTHROPLASTY ANTERIOR APPROACH  The risks benefits and alternatives were discussed with the patient including but not limited to the risks of nonoperative treatment, versus surgical intervention including infection, bleeding, nerve injury,  blood clots, cardiopulmonary complications, morbidity, mortality, among others, and they were willing to proceed.   Glee Arvin, MD 08/09/2023 7:29 AM

## 2023-08-09 NOTE — Op Note (Signed)
 RIGHT TOTAL HIP ARTHROPLASTY ANTERIOR APPROACH  Procedure Note Jacob Bradford   [id number]  Pre-op Diagnosis: right hip osteoarthritis     Post-op Diagnosis: same  Operative Findings Complete loss of joint space Prior lumbar spine fusion   Operative Procedures  1. Total hip replacement; Right hip; uncemented [id number]  2.  Application of incisional VAC. CPT 40981  Surgeon: Gershon Mussel, M.D.  Assist: Oneal Grout, PA-C   Anesthesia: spinal  Prosthesis: Depuy Acetabulum: Pinnacle 54 mm Femur: Actis 4 HO Head: 47/28 dual mobility size: +4 Liner: +4 Bearing Type: dual mobility  Total Hip Arthroplasty (Anterior Approach) Op Note:  After informed consent was obtained and the operative extremity marked in the holding area, the patient was brought back to the operating room and placed supine on the HANA table. Next, the operative extremity was prepped and draped in normal sterile fashion. Surgical timeout occurred verifying patient identification, surgical site, surgical procedure and administration of antibiotics.  A 10 cm longitudinal incision was made starting from 2 fingerbreadths lateral and inferior to the ASIS towards the lateral aspect of the patella.  A Hueter approach to the hip was performed, using the interval between tensor fascia lata and sartorius.  Dissection was carried bluntly down onto the anterior hip capsule. The lateral femoral circumflex vessels were identified and coagulated. A capsulotomy was performed and the capsular flaps tagged for later repair.  The neck osteotomy was performed. The femoral head was removed which showed severe wear, large degenerative osteophyte, the acetabular rim was cleared of soft tissue and osteophytes and attention was turned to reaming the acetabulum.  Sequential reaming was performed under fluoroscopic guidance down to the floor of the cotyloid fossa. We reamed to a size 53 mm, and then impacted the acetabular shell. A 25 mm  cancellous screw was placed to secure the shell.  The liner was then placed after irrigation and attention turned to the femur.  After placing the femoral hook, the leg was taken to externally rotated, extended and adducted position taking care to perform soft tissue releases to allow for adequate mobilization of the femur. Soft tissue was cleared from the shoulder of the greater trochanter and the hook elevator used to improve exposure of the proximal femur. Sequential broaching performed up to a size 4. High offset trial neck and head were placed. The leg was brought back up to neutral and the construct reduced.  The position and sizing of components, offset and leg lengths were checked using fluoroscopy. Stability of the construct was checked in 45 degrees of hip extension and 90 degrees of external rotation without any subluxation, shuck or impingement of prosthesis. We dislocated the prosthesis, dropped the leg back into position, removed trial components, and irrigated copiously. The final stem and head was then placed, the leg brought back up, the system reduced and fluoroscopy used to verify positioning.  Antibiotic irrigation was placed in the surgical wound.   We irrigated, obtained hemostasis and closed the capsule using #2 ethibond suture.  A topical mixture of 0.25% bupivacaine and meloxicam was placed deep to the fascia.  One gram of vancomycin powder was placed in the surgical bed.   One gram of topical tranexamic acid was injected into the joint.  The fascia was closed with #1 stratafix, the deep fat layer was closed with 0 vicryl, the subcutaneous layers closed with 2.0 Vicryl Plus and the skin closed with 2.0 nylon and incisional VAC due to large overhanging abdominal pannus. A sterile  dressing was applied. The patient was awakened in the operating room and taken to recovery in stable condition.  All sponge, needle, and instrument counts were correct at the end of the case.   Tessa Lerner,  my PA, was a medical necessity for opening, closing, limb positioning, retracting, exposing, and overall facilitation and timely completion of the surgery.  Position: supine  Complications: see description of procedure.  Time Out: performed   Drains/Packing: none  Estimated blood loss: see anesthesia record  Returned to Recovery Room: in good condition.   Antibiotics: yes   Mechanical VTE (DVT) Prophylaxis: sequential compression devices, TED thigh-high  Chemical VTE (DVT) Prophylaxis: xarelto   Fluid Replacement: see anesthesia record  Specimens Removed: 1 to pathology   Sponge and Instrument Count Correct? yes   PACU: portable radiograph - low AP   Plan/RTC: Return in 2 weeks for staple removal. Weight Bearing/Load Lower Extremity: full  Hip precautions: none Suture Removal: 2 weeks   N. Glee Arvin, MD [facility] 12:11 PM   Implant Name Type Inv. Item Serial No. Manufacturer Lot No. LRB No. Used Action  SCREW 6.5MMX25MM - [id number] Screw SCREW 6.5MMX25MM  [employer] [id number] Right 1 Implanted  LINER DM PINNACLE 54/47 - [id number] Liner LINER DM PINNACLE 54/47  [employer] [id number] Right 1 Implanted  ACETAB CUP W/GRIPTION 54 - [id number] Plate ACETAB CUP W/GRIPTION 54  [employer] [id number] Right 1 Implanted  HEAD CERAMIC DELTA 70M 12/14P5 - [id number] Head HEAD CERAMIC DELTA 70M 12/14P5  [employer] [id number] Right 1 Implanted  LINER FEM BM HIP ALTRX 47X28 - [id number] Liner LINER FEM BM HIP ALTRX 47X28  [employer] [id number] Right 1 Implanted  FEM STEM 12/14 TAPER SZ 4 HIP - [id number] Orthopedic Implant FEM STEM 12/14 TAPER SZ 4 HIP  [employer] [id number] Right 1 Implanted

## 2023-08-09 NOTE — Anesthesia Procedure Notes (Signed)
 Spinal  Patient location during procedure: OR Start time: 08/09/2023 10:45 AM End time: 08/09/2023 10:51 AM Reason for block: surgical anesthesia Staffing Anesthesiologist: Bethena Midget, MD Performed by: Bethena Midget, MD Authorized by: Bethena Midget, MD   Preanesthetic Checklist Completed: patient identified, IV checked, site marked, risks and benefits discussed, surgical consent, monitors and equipment checked, pre-op evaluation and timeout performed Spinal Block Patient position: sitting Prep: DuraPrep Patient monitoring: heart rate, cardiac monitor, continuous pulse ox and blood pressure Approach: midline Location: L2-3 Injection technique: single-shot Needle Needle type: Sprotte  Needle gauge: 24 G Needle length: 9 cm Assessment Sensory level: T4 Events: CSF return

## 2023-08-09 NOTE — Transfer of Care (Signed)
 Immediate Anesthesia Transfer of Care Note  Patient: Jacob Bradford  Procedure(s) Performed: RIGHT TOTAL HIP ARTHROPLASTY ANTERIOR APPROACH (Right: Hip)  Patient Location: PACU  Anesthesia Type:MAC and Spinal  Level of Consciousness: awake, alert , and oriented  Airway & Oxygen Therapy: Patient Spontanous Breathing and Patient connected to nasal cannula oxygen  Post-op Assessment: Report given to RN and Post -op Vital signs reviewed and stable  Post vital signs: Reviewed and stable  Last Vitals:  Vitals Value Taken Time  BP 117/78 08/09/23 1250  Temp    Pulse 68 08/09/23 1259  Resp 11 08/09/23 1300  SpO2 92 % 08/09/23 1259  Vitals shown include unfiled device data.  Last Pain:  Vitals:   08/09/23 0800  TempSrc:   PainSc: 6          Complications: There were no known notable events for this encounter.

## 2023-08-10 DIAGNOSIS — M1611 Unilateral primary osteoarthritis, right hip: Secondary | ICD-10-CM | POA: Diagnosis not present

## 2023-08-10 NOTE — Discharge Summary (Signed)
 Patient ID: Jacob Bradford MRN: [id number] DOB/AGE: 07/01/1960 63 y.o.  Admit date: 08/09/2023 Discharge date: 08/10/2023  Admission Diagnoses:  Primary osteoarthritis of right hip  Discharge Diagnoses:  Principal Problem:   Primary osteoarthritis of right hip Active Problems:   Status post total replacement of right hip   Past Medical History:  Diagnosis Date   Anxiety    Arthritis    Depression    GERD (gastroesophageal reflux disease)    Hyperlipidemia    Hypertension    Lunotriquetral ligament tear 10/11/2014   Polycythemia vera (HCC)    Sleep apnea    Spinal stenosis    Tear of anterior cruciate ligament of knee 10/11/2014    Surgeries: Procedure(s): RIGHT TOTAL HIP ARTHROPLASTY ANTERIOR APPROACH on 08/09/2023   Consultants (if any):   Discharged Condition: Improved  Hospital Course: Jacob Bradford is an 63 y.o. male who was admitted 08/09/2023 with a diagnosis of Primary osteoarthritis of right hip and went to the operating room on 08/09/2023 and underwent the above named procedures.    He was given perioperative antibiotics:  Anti-infectives (From admission, onward)    Start     Dose/Rate Route Frequency Ordered Stop   08/10/23 1000  doxycycline (VIBRA-TABS) tablet 100 mg       Note to Pharmacy: To be taken after surgery     100 mg Oral 2 times daily 08/09/23 1357     08/09/23 1800  ceFAZolin (ANCEF) IVPB 2g/100 mL premix        2 g 200 mL/hr over 30 Minutes Intravenous Every 6 hours 08/09/23 1357 08/10/23 0050   08/09/23 1130  vancomycin (VANCOCIN) powder  Status:  Discontinued          As needed 08/09/23 1130 08/09/23 1246   08/09/23 0900  ceFAZolin (ANCEF) IVPB 3g/150 mL premix        3 g 300 mL/hr over 30 Minutes Intravenous On call to O.R. 08/09/23 0739 08/09/23 1051   08/09/23 0900  vancomycin (VANCOREADY) IVPB 1500 mg/300 mL        1,500 mg 150 mL/hr over 120 Minutes Intravenous  Once 08/09/23 0740 08/09/23 1442   08/09/23 0000  doxycycline  (VIBRA-TABS) 100 MG tablet        100 mg Oral 2 times daily 08/09/23 0938       .  He was given sequential compression devices, early ambulation, and appropriate chemoprophylaxis for DVT prophylaxis.  He benefited maximally from the hospital stay and there were no complications.    Recent vital signs:  Vitals:   08/10/23 0456 08/10/23 0734  BP: (!) 150/80 (!) 132/58  Pulse: 73 73  Resp:  16  Temp: 98.4 F (36.9 C) (!) 97.4 F (36.3 C)  SpO2: 99% 96%    Recent laboratory studies:  Lab Results  Component Value Date   HGB 15.1 07/30/2023   HGB 14.9 07/12/2023   HGB 13.1 05/15/2023   Lab Results  Component Value Date   WBC 8.2 07/30/2023   PLT 220 07/30/2023   Lab Results  Component Value Date   INR 0.91 03/07/2016   Lab Results  Component Value Date   NA 138 07/30/2023   K 4.5 07/30/2023   CL 102 07/30/2023   CO2 27 07/30/2023   BUN 14 07/30/2023   CREATININE 1.22 07/30/2023   GLUCOSE 120 (H) 07/30/2023    Discharge Medications:   Allergies as of 08/10/2023   No Known Allergies      Medication  List     STOP taking these medications    aspirin 81 MG tablet   docusate sodium 100 MG capsule Commonly known as: Colace   doxycycline 100 MG capsule Commonly known as: Vibramycin Replaced by: doxycycline 100 MG tablet   GOODYS BACK & BODY PAIN PO       TAKE these medications    albuterol (2.5 MG/3ML) 0.083% nebulizer solution Commonly known as: PROVENTIL Take 2.5 mg by nebulization as needed for wheezing or shortness of breath.   albuterol 108 (90 Base) MCG/ACT inhaler Commonly known as: VENTOLIN HFA INHALE 2 PUFFS EVERY 6 HOURS AS NEEDED FOR WHEEZING OR SHORTNESS OF BREATH   ALPRAZolam 1 MG tablet Commonly known as: XANAX Take 1 tablet (1 mg total) by mouth 3 (three) times daily as needed for anxiety.   calcium carbonate 500 MG chewable tablet Commonly known as: TUMS - dosed in mg elemental calcium Chew 1,000 mg by mouth as needed for  indigestion or heartburn.   chlorhexidine 4 % external liquid Commonly known as: HIBICLENS Apply 15 mLs (1 Application total) topically as directed for 30 doses. Use as directed daily for 5 days every other week for 6 weeks.   doxycycline 100 MG tablet Commonly known as: VIBRA-TABS Take 1 tablet (100 mg total) by mouth 2 (two) times daily. To be taken after surgery Replaces: doxycycline 100 MG capsule   losartan-hydrochlorothiazide 100-12.5 MG tablet Commonly known as: HYZAAR Take 1 tablet by mouth daily.   methocarbamol 750 MG tablet Commonly known as: Robaxin-750 Take 1 tablet (750 mg total) by mouth 3 (three) times daily as needed for muscle spasms.   morphine 30 MG tablet Commonly known as: MSIR Take 1 tablet (30 mg total) by mouth every 4 (four) hours as needed for severe pain (pain score 7-10). What changed: additional instructions   mupirocin ointment 2 % Commonly known as: BACTROBAN Place 1 Application into the nose 2 (two) times daily for 60 doses. Use as directed 2 times daily for 5 days every other week for 6 weeks.   mupirocin ointment 2 % Commonly known as: BACTROBAN Apply 1 Application topically 2 (two) times daily.   naloxone 4 MG/0.1ML Liqd nasal spray kit Commonly known as: NARCAN Place 0.4 mg into the nose once.   omeprazole 20 MG capsule Commonly known as: PRILOSEC Take 20 mg by mouth daily as needed (heartburn).   ondansetron 4 MG tablet Commonly known as: Zofran Take 1 tablet (4 mg total) by mouth every 8 (eight) hours as needed for nausea or vomiting.   oxyCODONE-acetaminophen 5-325 MG tablet Commonly known as: Percocet Take 1 tablet by mouth every 8 (eight) hours as needed for severe pain (pain score 7-10). What changed:  how much to take when to take this reasons to take this additional instructions   rivaroxaban 10 MG Tabs tablet Commonly known as: XARELTO Take 1 tablet (10 mg total) by mouth daily. Take starting 2 weeks prior to surgery  date, stop taking 2 days before surgery and restart 2 days after surgery for 1 month.   Spiriva Respimat 2.5 MCG/ACT Aers Generic drug: Tiotropium Bromide Monohydrate INHALE TWO PUFFS DAILY               Durable Medical Equipment  (From admission, onward)           Start     Ordered   08/09/23 1358  DME Walker rolling  Once       Question:  Patient needs a walker  to treat with the following condition  Answer:  History of hip replacement   08/09/23 1357   08/09/23 1358  DME 3 n 1  Once        08/09/23 1357   08/09/23 1358  DME Bedside commode  Once       Question:  Patient needs a bedside commode to treat with the following condition  Answer:  History of hip replacement   08/09/23 1357            Diagnostic Studies: DG Pelvis Portable Result Date: 08/09/2023 CLINICAL DATA:  Lower AP pelvis right hip replacement anterior approach. EXAM: PORTABLE PELVIS 1-2 VIEWS COMPARISON:  Pelvis and right hip radiographs 04/11/2023 FINDINGS: Interval total right hip arthroplasty. No perihardware lucency is seen to indicate hardware failure or loosening. Expected lateral right hip postoperative subcutaneous air. Mild to moderate superior left femoroacetabular joint space narrowing with superolateral acetabular degenerative osteophytes. Mild bilateral sacroiliac joint space narrowing and subchondral sclerosis. Partial visualization of lumbosacral posterior fusion hardware. No acute fracture or dislocation. IMPRESSION: Interval total right hip arthroplasty without evidence of hardware failure. Electronically Signed   By: Neita Garnet M.D.   On: 08/09/2023 14:36   DG HIP UNILAT WITH PELVIS 1V RIGHT Result Date: 08/09/2023 CLINICAL DATA:  Elective surgery. EXAM: DG HIP (WITH OR WITHOUT PELVIS) 1V RIGHT COMPARISON:  None Available. FINDINGS: Twelve fluoroscopic spot views of the pelvis and right hip obtained in the operating room. Sequential images during hip arthroplasty. Fluoroscopy time 33.3.  Dose 7.44 mGy. IMPRESSION: Procedural fluoroscopy during right hip arthroplasty. Electronically Signed   By: Narda Rutherford M.D.   On: 08/09/2023 12:40   DG C-Arm 1-60 Min-No Report Result Date: 08/09/2023 Fluoroscopy was utilized by the requesting physician.  No radiographic interpretation.   CT CHEST LCS NODULE F/U LOW DOSE WO CONTRAST Result Date: 08/05/2023 CLINICAL DATA:  Abnormal lung cancer screening CT. Ninety-one pack-year smoker. EXAM: CT CHEST WITHOUT CONTRAST FOR LUNG CANCER SCREENING NODULE FOLLOW-UP TECHNIQUE: Multidetector CT imaging of the chest was performed following the standard protocol without IV contrast. RADIATION DOSE REDUCTION: This exam was performed according to the departmental dose-optimization program which includes automated exposure control, adjustment of the mA and/or kV according to patient size and/or use of iterative reconstruction technique. COMPARISON:  04/17/2023. FINDINGS: Cardiovascular: Enlarged pulmonic trunk. Heart is at the upper limits of normal in size to mildly enlarged. No pericardial effusion. Mediastinum/Nodes: Mediastinal lymph nodes measure up to 12 mm in the low right paratracheal station, unchanged. Hilar regions are difficult to definitively evaluate without IV contrast. No axillary adenopathy. Esophagus is grossly unremarkable. Lungs/Pleura: Centrilobular and paraseptal emphysema. Smoking related respiratory bronchiolitis. Scarring in the anterolateral left lower lobe. Calcified granulomas. Interval clearing of previously seen subpleural atelectasis in the posterior right upper lobe. Other pulmonary nodules measure 5.8 mm or less in size, as before. No pleural fluid. Airway is unremarkable. Upper Abdomen: Visualized portions of the liver, gallbladder, adrenal glands, spleen, pancreas, stomach and bowel are grossly unremarkable. No upper abdominal adenopathy. Musculoskeletal: Degenerative changes in the spine. IMPRESSION: 1. Lung-RADS 2, benign appearance  or behavior. Continue annual screening with low-dose chest CT without contrast in 12 months. 2. Enlarged pulmonic trunk, indicative of pulmonary arterial hypertension. 3.  Emphysema (ICD10-J43.9). Electronically Signed   By: Leanna Battles M.D.   On: 08/05/2023 16:16    Disposition: Discharge disposition: 01-Home or Self Care       Discharge Instructions     Call MD / Call 911  Complete by: As directed    If you experience chest pain or shortness of breath, CALL 911 and be transported to the hospital emergency room.  If you develope a fever above 101.5 F, pus (white drainage) or increased drainage or redness at the wound, or calf pain, call your surgeon's office.   Constipation Prevention   Complete by: As directed    Drink plenty of fluids.  Prune juice may be helpful.  You may use a stool softener, such as Colace (over the counter) 100 mg twice a day.  Use MiraLax (over the counter) for constipation as needed.   Driving restrictions   Complete by: As directed    No driving while taking narcotic pain meds.   Increase activity slowly as tolerated   Complete by: As directed    Post-operative opioid taper instructions:   Complete by: As directed    POST-OPERATIVE OPIOID TAPER INSTRUCTIONS: It is important to wean off of your opioid medication as soon as possible. If you do not need pain medication after your surgery it is ok to stop day one. Opioids include: Codeine, Hydrocodone(Norco, Vicodin), Oxycodone(Percocet, oxycontin) and hydromorphone amongst others.  Long term and even short term use of opiods can cause: Increased pain response Dependence Constipation Depression Respiratory depression And more.  Withdrawal symptoms can include Flu like symptoms Nausea, vomiting And more Techniques to manage these symptoms Hydrate well Eat regular healthy meals Stay active Use relaxation techniques(deep breathing, meditating, yoga) Do Not substitute Alcohol to help with  tapering If you have been on opioids for less than two weeks and do not have pain than it is ok to stop all together.  Plan to wean off of opioids This plan should start within one week post op of your joint replacement. Maintain the same interval or time between taking each dose and first decrease the dose.  Cut the total daily intake of opioids by one tablet each day Next start to increase the time between doses. The last dose that should be eliminated is the evening dose.           Follow-up Information     Cristie Hem, PA-C. Schedule an appointment as soon as possible for a visit in 1 week(s).   Specialty: Orthopedic Surgery Contact information: [address] [place] [address] [phone]                  Signed: Glee Arvin 08/10/2023, 9:16 AM

## 2023-08-10 NOTE — Plan of Care (Signed)
 Problem: Education: Goal: Knowledge of General Education information will improve Description: Including pain rating scale, medication(s)/side effects and non-pharmacologic comfort measures 08/10/2023 1335 by Carrington Clamp, RN Outcome: Adequate for Discharge 08/10/2023 1335 by Carrington Clamp, RN Outcome: Adequate for Discharge   Problem: Health Behavior/Discharge Planning: Goal: Ability to manage health-related needs will improve 08/10/2023 1335 by Carrington Clamp, RN Outcome: Adequate for Discharge 08/10/2023 1335 by Carrington Clamp, RN Outcome: Adequate for Discharge   Problem: Clinical Measurements: Goal: Ability to maintain clinical measurements within normal limits will improve 08/10/2023 1335 by Carrington Clamp, RN Outcome: Adequate for Discharge 08/10/2023 1335 by Carrington Clamp, RN Outcome: Adequate for Discharge Goal: Will remain free from infection 08/10/2023 1335 by Carrington Clamp, RN Outcome: Adequate for Discharge 08/10/2023 1335 by Carrington Clamp, RN Outcome: Adequate for Discharge Goal: Diagnostic test results will improve 08/10/2023 1335 by Carrington Clamp, RN Outcome: Adequate for Discharge 08/10/2023 1335 by Carrington Clamp, RN Outcome: Adequate for Discharge Goal: Respiratory complications will improve 08/10/2023 1335 by Carrington Clamp, RN Outcome: Adequate for Discharge 08/10/2023 1335 by Carrington Clamp, RN Outcome: Adequate for Discharge Goal: Cardiovascular complication will be avoided 08/10/2023 1335 by Carrington Clamp, RN Outcome: Adequate for Discharge 08/10/2023 1335 by Carrington Clamp, RN Outcome: Adequate for Discharge   Problem: Activity: Goal: Risk for activity intolerance will decrease 08/10/2023 1335 by Carrington Clamp, RN Outcome: Adequate for Discharge 08/10/2023 1335 by Carrington Clamp, RN Outcome: Adequate for Discharge   Problem: Nutrition: Goal: Adequate nutrition will be  maintained 08/10/2023 1335 by Carrington Clamp, RN Outcome: Adequate for Discharge 08/10/2023 1335 by Carrington Clamp, RN Outcome: Adequate for Discharge   Problem: Coping: Goal: Level of anxiety will decrease 08/10/2023 1335 by Carrington Clamp, RN Outcome: Adequate for Discharge 08/10/2023 1335 by Carrington Clamp, RN Outcome: Adequate for Discharge   Problem: Elimination: Goal: Will not experience complications related to bowel motility 08/10/2023 1335 by Carrington Clamp, RN Outcome: Adequate for Discharge 08/10/2023 1335 by Carrington Clamp, RN Outcome: Adequate for Discharge Goal: Will not experience complications related to urinary retention 08/10/2023 1335 by Carrington Clamp, RN Outcome: Adequate for Discharge 08/10/2023 1335 by Carrington Clamp, RN Outcome: Adequate for Discharge   Problem: Pain Managment: Goal: General experience of comfort will improve and/or be controlled 08/10/2023 1335 by Carrington Clamp, RN Outcome: Adequate for Discharge 08/10/2023 1335 by Carrington Clamp, RN Outcome: Adequate for Discharge   Problem: Safety: Goal: Ability to remain free from injury will improve 08/10/2023 1335 by Carrington Clamp, RN Outcome: Adequate for Discharge 08/10/2023 1335 by Carrington Clamp, RN Outcome: Adequate for Discharge   Problem: Skin Integrity: Goal: Risk for impaired skin integrity will decrease 08/10/2023 1335 by Carrington Clamp, RN Outcome: Adequate for Discharge 08/10/2023 1335 by Carrington Clamp, RN Outcome: Adequate for Discharge   Problem: Education: Goal: Knowledge of the prescribed therapeutic regimen will improve 08/10/2023 1335 by Carrington Clamp, RN Outcome: Adequate for Discharge 08/10/2023 1335 by Carrington Clamp, RN Outcome: Adequate for Discharge Goal: Understanding of discharge needs will improve 08/10/2023 1335 by Carrington Clamp, RN Outcome: Adequate for Discharge 08/10/2023 1335 by  Carrington Clamp, RN Outcome: Adequate for Discharge Goal: Individualized Educational Video(s) 08/10/2023 1335 by Carrington Clamp, RN Outcome: Adequate for Discharge 08/10/2023 1335 by Carrington Clamp, RN Outcome: Adequate for Discharge   Problem: Activity: Goal: Ability to avoid complications of  mobility impairment will improve 08/10/2023 1335 by Niema Carrara, Gilford Rile, RN Outcome: Adequate for Discharge 08/10/2023 1335 by Carrington Clamp, RN Outcome: Adequate for Discharge Goal: Ability to tolerate increased activity will improve 08/10/2023 1335 by Yaremi Stahlman, Gilford Rile, RN Outcome: Adequate for Discharge 08/10/2023 1335 by Carrington Clamp, RN Outcome: Adequate for Discharge   Problem: Clinical Measurements: Goal: Postoperative complications will be avoided or minimized 08/10/2023 1335 by Carrington Clamp, RN Outcome: Adequate for Discharge 08/10/2023 1335 by Carrington Clamp, RN Outcome: Adequate for Discharge   Problem: Pain Management: Goal: Pain level will decrease with appropriate interventions 08/10/2023 1335 by Carrington Clamp, RN Outcome: Adequate for Discharge 08/10/2023 1335 by Carrington Clamp, RN Outcome: Adequate for Discharge   Problem: Skin Integrity: Goal: Will show signs of wound healing 08/10/2023 1335 by Carrington Clamp, RN Outcome: Adequate for Discharge 08/10/2023 1335 by Carrington Clamp, RN Outcome: Adequate for Discharge

## 2023-08-10 NOTE — Progress Notes (Signed)
   Subjective:  Patient reports pain as Burning in the thigh..  No events. Did well with first PT session.  Objective:   VITALS:   Vitals:   08/09/23 1611 08/09/23 2038 08/10/23 0456 08/10/23 0734  BP: (!) 119/58 (!) 141/64 (!) 150/80 (!) 132/58  Pulse: 72 83 73 73  Resp: 16   16  Temp: 98 F (36.7 C) 98.9 F (37.2 C) 98.4 F (36.9 C) (!) 97.4 F (36.3 C)  TempSrc: Oral Oral Oral Oral  SpO2: 93% 92% 99% 96%  Weight:      Height:        Sensation intact distally Intact pulses distally Dorsiflexion/Plantar flexion intact Incision: dressing C/D/I and No drainage in canister.   Lab Results  Component Value Date   WBC 8.2 07/30/2023   HGB 15.1 07/30/2023   HCT 48.4 07/30/2023   MCV 81.2 07/30/2023   PLT 220 07/30/2023     Assessment/Plan:  1 Day Post-Op   - Expected postop acute blood loss anemia - Up with PT/OT - DVT ppx - SCDs, ambulation, Xarelto - WBAT operative extremity - Pain control - Discharge home when he clears PT. - home with ice man - needs hospital [facility] unit switched to home prevena unit when ready to go home  Jacob Bradford 08/10/2023, 8:55 AM

## 2023-08-10 NOTE — Care Management Obs Status (Signed)
 MEDICARE OBSERVATION STATUS NOTIFICATION   Patient Details  Name: Jacob Bradford MRN: [id number] Date of Birth: [birth year]   Medicare Observation Status Notification Given:  Yes    Ronny Bacon, RN 08/10/2023, 9:55 AM

## 2023-08-10 NOTE — Plan of Care (Signed)
  Problem: Education: Goal: Knowledge of General Education information will improve Description: Including pain rating scale, medication(s)/side effects and non-pharmacologic comfort measures Outcome: Adequate for Discharge   Problem: Health Behavior/Discharge Planning: Goal: Ability to manage health-related needs will improve Outcome: Adequate for Discharge   Problem: Clinical Measurements: Goal: Ability to maintain clinical measurements within normal limits will improve Outcome: Adequate for Discharge Goal: Will remain free from infection Outcome: Adequate for Discharge Goal: Diagnostic test results will improve Outcome: Adequate for Discharge Goal: Respiratory complications will improve Outcome: Adequate for Discharge Goal: Cardiovascular complication will be avoided Outcome: Adequate for Discharge   Problem: Activity: Goal: Risk for activity intolerance will decrease Outcome: Adequate for Discharge   Problem: Nutrition: Goal: Adequate nutrition will be maintained Outcome: Adequate for Discharge   Problem: Coping: Goal: Level of anxiety will decrease Outcome: Adequate for Discharge   Problem: Elimination: Goal: Will not experience complications related to bowel motility Outcome: Adequate for Discharge Goal: Will not experience complications related to urinary retention Outcome: Adequate for Discharge   Problem: Pain Managment: Goal: General experience of comfort will improve and/or be controlled Outcome: Adequate for Discharge   Problem: Safety: Goal: Ability to remain free from injury will improve Outcome: Adequate for Discharge   Problem: Skin Integrity: Goal: Risk for impaired skin integrity will decrease Outcome: Adequate for Discharge   Problem: Education: Goal: Knowledge of the prescribed therapeutic regimen will improve Outcome: Adequate for Discharge Goal: Understanding of discharge needs will improve Outcome: Adequate for Discharge Goal:  Individualized Educational Video(s) Outcome: Adequate for Discharge   Problem: Activity: Goal: Ability to avoid complications of mobility impairment will improve Outcome: Adequate for Discharge Goal: Ability to tolerate increased activity will improve Outcome: Adequate for Discharge   Problem: Clinical Measurements: Goal: Postoperative complications will be avoided or minimized Outcome: Adequate for Discharge   Problem: Pain Management: Goal: Pain level will decrease with appropriate interventions Outcome: Adequate for Discharge   Problem: Skin Integrity: Goal: Will show signs of wound healing Outcome: Adequate for Discharge

## 2023-08-10 NOTE — Progress Notes (Signed)
 Physical Therapy Treatment Patient Details Name: Jacob Bradford MRN: [id number] DOB: 1960-10-05 Today's Date: 08/10/2023   History of Present Illness 63 y.o. male presents to [facility] hospital on 08/09/2023 for elective R THA. PMH includes COPD, lumbar fusion, polycythemia vera.    PT Comments  Pt supine in bed on arrival.  He reports feeling tired and sore from his surgery.  Pt is eager to mobilize this am.  Performed exercises for HEP, gait and stair training.  Pt issued HEP and cold pack applied to R hip and thigh.  Pt is progressing well.  Pt to d/c home with support from his family.      If plan is discharge home, recommend the following: A little help with walking and/or transfers;A little help with bathing/dressing/bathroom;Assistance with cooking/housework;Assist for transportation;Help with stairs or ramp for entrance   Can travel by private vehicle        Equipment Recommendations  None recommended by PT    Recommendations for Other Services       Precautions / Restrictions Precautions Precautions: Fall Recall of Precautions/Restrictions: Intact Restrictions Weight Bearing Restrictions Per Provider Order: No     Mobility  Bed Mobility Overal bed mobility: Needs Assistance Bed Mobility: Supine to Sit     Supine to sit: Modified independent (Device/Increase time), HOB elevated     General bed mobility comments: Pt required increased time and effort utilizing momentum and rails to rise into sitting.    Transfers Overall transfer level: Needs assistance Equipment used: Rolling walker (2 wheels) Transfers: Sit to/from Stand Sit to Stand: Supervision           General transfer comment: Cues for hand placement and forward advancement of R LE to reduce pain and discomfort returning to seated position.    Ambulation/Gait Ambulation/Gait assistance: Supervision Gait Distance (Feet): 150 Feet Assistive device: Rolling walker (2 wheels) Gait Pattern/deviations:  Step-to pattern, Step-through pattern, Trunk flexed, Decreased weight shift to right Gait velocity: reduced     General Gait Details: Cues for sequencing and weight shifting R to reduce stomping on LLE.  Pt progressed to step through pattern without cueing.   Stairs Stairs: Yes Stairs assistance: Supervision Stair Management: One rail Right, Forwards, Backwards Number of Stairs: 1 General stair comments: Pt performed forward to ascend and backwards to descend.   Wheelchair Mobility     Tilt Bed    Modified Rankin (Stroke Patients Only)       Balance Overall balance assessment: Needs assistance Sitting-balance support: No upper extremity supported, Feet supported Sitting balance-Leahy Scale: Good Sitting balance - Comments: pt sits EOB without UE support. No LOB     Standing balance-Leahy Scale: Poor Standing balance comment: pt reliant on RW for standing balance and initially stands with increased trunk flexion                            Communication Communication Communication: No apparent difficulties Factors Affecting Communication: Hearing impaired (does not have his hearing aids at hospital)  Cognition Arousal: Alert                               Following commands: Intact      Cueing Cueing Techniques: Verbal cues  Exercises General Exercises - Lower Extremity Ankle Circles/Pumps: AROM, Supine, 20 reps, Both Quad Sets: AROM, Right, 10 reps, Supine Short Arc Quad: AROM, Right, 10 reps, Supine Long Arc  Quad: AROM, Right, 10 reps, Supine Heel Slides: AROM, Right, 10 reps, Supine Hip ABduction/ADduction: AROM, Right, 10 reps, Supine    General Comments        Pertinent Vitals/Pain Pain Assessment Pain Assessment: Faces Faces Pain Scale: Hurts little more Pain Location: R hip and thigh Pain Descriptors / Indicators: Sore, Tender, Burning Pain Intervention(s): Monitored during session, Repositioned, Patient requesting pain  meds-RN notified, Ice applied (2 cold packs to R hip and thight post tx.)    Home Living                          Prior Function            PT Goals (current goals can now be found in the care plan section) Acute Rehab PT Goals Patient Stated Goal: return home Potential to Achieve Goals: Good Progress towards PT goals: Progressing toward goals    Frequency    7X/week      PT Plan      Co-evaluation              AM-PAC PT "6 Clicks" Mobility   Outcome Measure  Help needed turning from your back to your side while in a flat bed without using bedrails?: A Little Help needed moving from lying on your back to sitting on the side of a flat bed without using bedrails?: A Little Help needed moving to and from a bed to a chair (including a wheelchair)?: A Little Help needed standing up from a chair using your arms (e.g., wheelchair or bedside chair)?: A Little Help needed to walk in hospital room?: A Little Help needed climbing 3-5 steps with a railing? : A Lot 6 Click Score: 17    End of Session Equipment Utilized During Treatment: Gait belt Activity Tolerance: Patient tolerated treatment well Patient left: in chair;with call bell/phone within reach;with chair alarm set;with family/visitor present Nurse Communication: Mobility status PT Visit Diagnosis: Other abnormalities of gait and mobility (R26.89);Muscle weakness (generalized) (M62.81);Difficulty in walking, not elsewhere classified (R26.2);Pain Pain - Right/Left: Right Pain - part of body: Leg     Time: 9147-8295 PT Time Calculation (min) (ACUTE ONLY): 29 min  Charges:    $Gait Training: 8-22 mins $Therapeutic Exercise: 8-22 mins PT General Charges $$ ACUTE PT VISIT: 1 Visit                     Bonney Leitz , PTA Acute Rehabilitation Services Office [phone]    Florestine Avers 08/10/2023, 11:24 AM

## 2023-08-10 NOTE — TOC Transition Note (Addendum)
 Transition of Care [facility]) - Discharge Note   Patient Details  Name: Jacob Bradford MRN: [id number] Date of Birth: 1960/12/24  [facility]) CM/SW Contact:  Ronny Bacon, RN Phone Number: 08/10/2023, 10:11 AM   Clinical Narrative:   Patient is being discharged today. Glenda with [facility] made aware. DME RW, BSC/3:1 ordered through [facility] with [employer].   1415: Per Rivka Barbara, they are not following patient. Spoke with Herbert Seta ([facility]), they are following the patient and made aware that he is being discharged.     Final next level of care: Home w Home Health Services Barriers to Discharge: No Barriers Identified   Patient Goals and CMS Choice            Discharge Placement                       Discharge Plan and Services Additional resources added to the After Visit Summary for                  DME Arranged: Bedside commode, 3-N-1, Walker rolling DME Agency: [employer] Date DME Agency Contacted: 08/10/23 Time DME Agency Contacted: [phone] Representative spoke with at DME Agency: Vaughan Basta (@ 1002)            Social Drivers of Health (SDOH) Interventions SDOH Screenings   Food Insecurity: No Food Insecurity (08/09/2023)  Housing: Low Risk  (08/09/2023)  Transportation Needs: No Transportation Needs (08/09/2023)  Utilities: Not At Risk (08/09/2023)  Social Connections: Moderately Integrated (08/09/2023)  Tobacco Use: High Risk (08/09/2023)     Readmission Risk Interventions     No data to display

## 2023-08-12 ENCOUNTER — Encounter (HOSPITAL_COMMUNITY): Payer: Self-pay | Admitting: Orthopaedic Surgery

## 2023-08-14 ENCOUNTER — Other Ambulatory Visit: Payer: Self-pay | Admitting: Hematology & Oncology

## 2023-08-14 DIAGNOSIS — M47817 Spondylosis without myelopathy or radiculopathy, lumbosacral region: Secondary | ICD-10-CM

## 2023-08-14 MED ORDER — MORPHINE SULFATE 30 MG PO TABS: 30.0000 mg | ORAL_TABLET | ORAL | 0 refills | Status: DC | PRN

## 2023-08-14 NOTE — Telephone Encounter (Signed)
 Last refilled 07/17/23 #180 (prescribed to take every 4 hours PRN). Please advise, thanks!

## 2023-08-15 ENCOUNTER — Encounter (HOSPITAL_COMMUNITY): Payer: Self-pay | Admitting: Orthopaedic Surgery

## 2023-08-15 NOTE — Anesthesia Postprocedure Evaluation (Signed)
 Anesthesia Post Note  Patient: Jacob Bradford  Procedure(s) Performed: RIGHT TOTAL HIP ARTHROPLASTY ANTERIOR APPROACH (Right: Hip)     Patient location during evaluation: PACU Anesthesia Type: MAC Level of consciousness: awake and alert Pain management: pain level controlled Vital Signs Assessment: post-procedure vital signs reviewed and stable Respiratory status: spontaneous breathing, nonlabored ventilation, respiratory function stable and patient connected to nasal cannula oxygen Cardiovascular status: stable and blood pressure returned to baseline Postop Assessment: no apparent nausea or vomiting Anesthetic complications: no   There were no known notable events for this encounter.    Shelton Silvas

## 2023-08-16 ENCOUNTER — Encounter: Payer: Self-pay | Admitting: Hematology & Oncology

## 2023-08-16 ENCOUNTER — Telehealth: Payer: Self-pay

## 2023-08-16 NOTE — Telephone Encounter (Signed)
 Notified Patient of prior authorization approval for Morphine (MSIR) 30 mg Tablets. Medication is approved through 06/03/2024. Pharmacy notified. No other needs or concerns noted at this time.

## 2023-08-19 ENCOUNTER — Other Ambulatory Visit: Payer: Self-pay | Admitting: Physician Assistant

## 2023-08-19 ENCOUNTER — Telehealth: Payer: Self-pay | Admitting: Orthopaedic Surgery

## 2023-08-19 ENCOUNTER — Other Ambulatory Visit (HOSPITAL_COMMUNITY): Payer: Self-pay

## 2023-08-19 MED ORDER — OXYCODONE-ACETAMINOPHEN 5-325 MG PO TABS: 1.0000 | ORAL_TABLET | Freq: Three times a day (TID) | ORAL | 0 refills | Status: DC | PRN

## 2023-08-19 NOTE — Telephone Encounter (Signed)
 Called patient and left message that refill has been sent. Yes, Xarelto that was prescribed for surgery.

## 2023-08-19 NOTE — Telephone Encounter (Signed)
 [employer]. Spoke with patient. He wants a refill of his oxycodone 5/325.  He ask about when to discontinue his blood thinner, but I ask him to continue on as instructed and we will see him next week for his appt.

## 2023-08-19 NOTE — Telephone Encounter (Signed)
 Josh Scientist, research (physical sciences)) called from [facility] requesting a call back about pt's medications. Concerns of pt meds and intake. Pt medications are xanax, robaxin, oxycodone, morphine. Please call Josh asap at [phone].

## 2023-08-19 NOTE — Telephone Encounter (Signed)
 Pt called requesting a call back asking for refill of oxycodone and have questions about his medication and asking for a call back. Please call pt at [phone].

## 2023-08-19 NOTE — Telephone Encounter (Signed)
 Sent in the oxy.  Is he on the xarelto that was sent in back in November?

## 2023-08-20 ENCOUNTER — Other Ambulatory Visit: Payer: Self-pay | Admitting: Physician Assistant

## 2023-08-20 ENCOUNTER — Other Ambulatory Visit (HOSPITAL_COMMUNITY): Payer: Self-pay

## 2023-08-20 ENCOUNTER — Telehealth: Payer: Self-pay | Admitting: Radiology

## 2023-08-20 ENCOUNTER — Other Ambulatory Visit: Payer: Self-pay

## 2023-08-20 MED ORDER — METHOCARBAMOL 750 MG PO TABS: 750.0000 mg | ORAL_TABLET | Freq: Three times a day (TID) | ORAL | 2 refills | Status: DC | PRN

## 2023-08-20 NOTE — Telephone Encounter (Signed)
 Do not fill oxycodone.  I have forgotten he is on morphine.  Can we also let patient know to not ask [place] for oxy as he is on morphine

## 2023-08-20 NOTE — Telephone Encounter (Signed)
 Called patient back and told him that we cancelled the oxy script due to him being on morphine.

## 2023-08-20 NOTE — Telephone Encounter (Signed)
 sent

## 2023-08-20 NOTE — Telephone Encounter (Signed)
 See previous message about oxy.  Do not fill.  Please let patient know we cannot fill any narcotics as he is on morphine.

## 2023-08-20 NOTE — Telephone Encounter (Signed)
 FYI   Patient came in to the office upset and states that [facility] will not fill his muscle relaxer without speaking to someone here first. I called [employer] and spoke with pharmacist. He wanted to be sure that we were aware patient is taking morphine 30mg  tablet q 6 hours prn and Xanax 1 mg 4x/day prn.  He was unsure if we wanted to add muscle relaxer to that. He also advised he had received a call from [facility] asking that the rx be transferred back to them.  I spoke with Lillia Abed who said she is uncomfortable filling rx and that patient should speak to his pain management physician. I spoke in detail with patient. He states that he is not on pain management. I asked who prescribed his pain medication, etc and he states Dr. Myna Hidalgo. I explained he could reach out to Dr. Myna Hidalgo to see if he is comfortable giving him a muscle relaxer, but advised we are not comfortable giving him this which could react with his other medications. Patient states that Methocarbamol is really the only thing that is helping him, other than Epsom salt baths, which he cannot take right now. I did advise not to get into bath tub.  He will reach out to Dr. Myna Hidalgo to see if he may have any advice on the muscle relaxer.  I called [facility] and advised, do not fill. She states that they do not do outpatient prescriptions once they have left the hospital.  I discontinued in chart.  I called [facility] and spoke with pharmacist. Advised we are discontinuing medication that we have ordered.

## 2023-08-20 NOTE — Telephone Encounter (Signed)
 Notified patient.

## 2023-08-20 NOTE — Telephone Encounter (Signed)
 Pt called back today stating the pharmacy put all his medication on hold and he is only requesting a muscle relaxer. Pt best call back [phone]

## 2023-08-20 NOTE — Telephone Encounter (Signed)
 Called pharmacy and cancelled script of oxycodone.

## 2023-08-21 NOTE — Telephone Encounter (Signed)
 Thank you B[facility].  Narcotic pain meds and muscle relaxers not to be filled by [place].  Patient aware to reach out to Dr. Myna Hidalgo for further pain management

## 2023-08-21 NOTE — Progress Notes (Unsigned)
 Post-Op Visit Note   Patient: Jacob Bradford           Date of Birth: 1960/12/20           MRN: [id number] Visit Date: 08/23/2023 PCP: Elie Confer, NP   Assessment & Plan:  Chief Complaint: No chief complaint on file. Visit Diagnoses: No diagnosis found.  Plan: ***  Follow-Up Instructions: No follow-ups on file.   Orders:  No orders of the defined types were placed in this encounter. No orders of the defined types were placed in this encounter.  Imaging: No results found.  PMFS History: Patient Active Problem List   Diagnosis Date Noted  . Status post total replacement of right hip 08/09/2023  . Oxygen dependent 09/26/2022  . COPD (chronic obstructive pulmonary disease) (HCC) 09/07/2022  . Snoring 07/19/2022  . Chronic respiratory failure with hypoxia (HCC) 07/19/2022  . Tobacco abuse 07/19/2022  . AKI (acute kidney injury) (HCC) 06/03/2022  . Influenza A 06/02/2022  . Community acquired pneumonia 06/01/2022  . Primary osteoarthritis of right hip 03/21/2022  . Body mass index 40.0-44.9, adult (HCC) 03/21/2022  . IDA (iron deficiency anemia) 11/13/2016  . S/P lumbar spinal fusion 03/07/2016  . Lunotriquetral ligament tear 10/11/2014  . Tear of anterior cruciate ligament of knee 10/11/2014  . Lumbosacral spondylosis without myelopathy 04/24/2011  . Polycythemia vera (HCC) 04/24/2011  . Sprain of ankle 04/21/2009   Past Medical History:  Diagnosis Date  . Anxiety   . Arthritis   . Depression   . GERD (gastroesophageal reflux disease)   . Hyperlipidemia   . Hypertension   . Lunotriquetral ligament tear 10/11/2014  . Polycythemia vera (HCC)   . Sleep apnea   . Spinal stenosis   . Tear of anterior cruciate ligament of knee 10/11/2014    Family History  Problem Relation Age of Onset  . Heart disease Father   . Colon cancer Neg Hx   . Colon polyps Neg Hx   . Esophageal cancer Neg Hx   . Rectal cancer Neg Hx   . Stomach cancer Neg Hx     Past  Surgical History:  Procedure Laterality Date  . COLONOSCOPY     age 81- 3 benign polyps per pt   . MAXIMUM ACCESS (MAS)POSTERIOR LUMBAR INTERBODY FUSION (PLIF) 2 LEVEL N/A 03/07/2016   Procedure: LUMBAR FOUR-FIVE,LUMBAR FIVE-SACRAL ONE MAXIMUM ACCESS (MAS) POSTERIOR LUMBAR INTERBODY FUSION (PLIF) 2 LEVEL;  Surgeon: Tia Alert, MD;  Location: [facility] OR;  Service: Neurosurgery;  Laterality: N/A;  . TONSILLECTOMY     and Adenoid  . TOTAL HIP ARTHROPLASTY Right 08/09/2023   Procedure: RIGHT TOTAL HIP ARTHROPLASTY ANTERIOR APPROACH;  Surgeon: Tarry Kos, MD;  Location: [facility] OR;  Service: Orthopedics;  Laterality: Right;  3-C   Social History   Occupational History  . Occupation: Disabled since 2019  Tobacco Use  . Smoking status: Every Day    Current packs/day: 0.00    Average packs/day: 0.5 packs/day for 45.0 years (22.5 ttl pk-yrs)    Types: Cigarettes    Start date: 07/14/1979    Last attempt to quit: 07/02/2021    Years since quitting: 2.1  . Smokeless tobacco: Never  . Tobacco comments:    Patient smoking a pack a day .   Vaping Use  . Vaping status: Former  Substance and Sexual Activity  . Alcohol use: Yes    Alcohol/week: 0.0 standard drinks of alcohol    Comment: 2-3  drinks a month  if this- rare   . Drug use: No    Comment: THC gummy x 1  . Sexual activity: Yes

## 2023-08-23 ENCOUNTER — Inpatient Hospital Stay: Payer: Medicare PPO

## 2023-08-23 ENCOUNTER — Encounter: Payer: Self-pay | Admitting: Orthopaedic Surgery

## 2023-08-23 ENCOUNTER — Other Ambulatory Visit (HOSPITAL_COMMUNITY): Payer: Self-pay

## 2023-08-23 ENCOUNTER — Encounter: Payer: Self-pay | Admitting: Hematology & Oncology

## 2023-08-23 ENCOUNTER — Ambulatory Visit: Payer: Medicare PPO | Admitting: Orthopaedic Surgery

## 2023-08-23 ENCOUNTER — Ambulatory Visit: Payer: Medicare PPO | Admitting: Hematology & Oncology

## 2023-08-23 DIAGNOSIS — Z96641 Presence of right artificial hip joint: Secondary | ICD-10-CM

## 2023-08-23 MED ORDER — METHOCARBAMOL 750 MG PO TABS
750.0000 mg | ORAL_TABLET | Freq: Two times a day (BID) | ORAL | 3 refills | Status: DC | PRN
  Filled 2023-08-23: qty 20, 10d supply, fill #0
  Filled 2023-09-11: qty 20, 10d supply, fill #1

## 2023-08-26 ENCOUNTER — Inpatient Hospital Stay: Attending: Hematology & Oncology

## 2023-08-26 ENCOUNTER — Inpatient Hospital Stay (HOSPITAL_BASED_OUTPATIENT_CLINIC_OR_DEPARTMENT_OTHER): Admitting: Hematology & Oncology

## 2023-08-26 ENCOUNTER — Inpatient Hospital Stay

## 2023-08-26 ENCOUNTER — Other Ambulatory Visit: Payer: Self-pay

## 2023-08-26 ENCOUNTER — Encounter: Payer: Self-pay | Admitting: Hematology & Oncology

## 2023-08-26 VITALS — BP 109/60 | HR 81 | Temp 97.9°F | Resp 19 | Ht 72.0 in | Wt 279.0 lb

## 2023-08-26 DIAGNOSIS — Z8639 Personal history of other endocrine, nutritional and metabolic disease: Secondary | ICD-10-CM | POA: Insufficient documentation

## 2023-08-26 DIAGNOSIS — D45 Polycythemia vera: Secondary | ICD-10-CM

## 2023-08-26 DIAGNOSIS — G8929 Other chronic pain: Secondary | ICD-10-CM | POA: Diagnosis not present

## 2023-08-26 DIAGNOSIS — M545 Low back pain, unspecified: Secondary | ICD-10-CM | POA: Insufficient documentation

## 2023-08-26 DIAGNOSIS — D751 Secondary polycythemia: Secondary | ICD-10-CM | POA: Diagnosis present

## 2023-08-26 DIAGNOSIS — F17200 Nicotine dependence, unspecified, uncomplicated: Secondary | ICD-10-CM | POA: Diagnosis not present

## 2023-08-26 LAB — CMP (CANCER CENTER ONLY)
ALT: 24 U/L (ref 0–44)
AST: 28 U/L (ref 15–41)
Albumin: 4 g/dL (ref 3.5–5.0)
Alkaline Phosphatase: 83 U/L (ref 38–126)
Anion gap: 10 (ref 5–15)
BUN: 13 mg/dL (ref 8–23)
CO2: 29 mmol/L (ref 22–32)
Calcium: 8.7 mg/dL — ABNORMAL LOW (ref 8.9–10.3)
Chloride: 96 mmol/L — ABNORMAL LOW (ref 98–111)
Creatinine: 1.17 mg/dL (ref 0.61–1.24)
GFR, Estimated: >60 mL/min (ref >60)
Glucose, Bld: 98 mg/dL (ref 70–99)
Potassium: 4.4 mmol/L (ref 3.5–5.1)
Sodium: 135 mmol/L (ref 135–145)
Total Bilirubin: 0.5 mg/dL (ref 0.0–1.2)
Total Protein: 6.9 g/dL (ref 6.5–8.1)

## 2023-08-26 LAB — CBC WITH DIFFERENTIAL (CANCER CENTER ONLY)
Abs Immature Granulocytes: 0.1 10*3/uL — ABNORMAL HIGH (ref 0.00–0.07)
Basophils Absolute: 0.1 10*3/uL (ref 0.0–0.1)
Basophils Relative: 1 %
Eosinophils Absolute: 0.3 10*3/uL (ref 0.0–0.5)
Eosinophils Relative: 4 %
HCT: 40 % (ref 39.0–52.0)
Hemoglobin: 12.5 g/dL — ABNORMAL LOW (ref 13.0–17.0)
Immature Granulocytes: 1 %
Lymphocytes Relative: 23 %
Lymphs Abs: 1.9 10*3/uL (ref 0.7–4.0)
MCH: 25.8 pg — ABNORMAL LOW (ref 26.0–34.0)
MCHC: 31.3 g/dL (ref 30.0–36.0)
MCV: 82.6 fL (ref 80.0–100.0)
Monocytes Absolute: 0.6 10*3/uL (ref 0.1–1.0)
Monocytes Relative: 7 %
Neutro Abs: 5.3 10*3/uL (ref 1.7–7.7)
Neutrophils Relative %: 64 %
Platelet Count: 324 10*3/uL (ref 150–400)
RBC: 4.84 MIL/uL (ref 4.22–5.81)
RDW: 15.9 % — ABNORMAL HIGH (ref 11.5–15.5)
WBC Count: 8.2 10*3/uL (ref 4.0–10.5)
nRBC: 0 % (ref 0.0–0.2)

## 2023-08-26 LAB — RETICULOCYTES
Immature Retic Fract: 20.3 % — ABNORMAL HIGH (ref 2.3–15.9)
RBC.: 4.87 MIL/uL (ref 4.22–5.81)
Retic Count, Absolute: 78.9 10*3/uL (ref 19.0–186.0)
Retic Ct Pct: 1.6 % (ref 0.4–3.1)

## 2023-08-26 LAB — FERRITIN: Ferritin: 32 ng/mL (ref 24–336)

## 2023-08-26 NOTE — Progress Notes (Signed)
 Hematology and Oncology Follow Up Visit  Jacob Bradford [id number] [birth year] 63 y.o. 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  He finally had his right hip fixed.  This was done on March 7.  He is feeling a whole lot better.  He says that there is no more grinding or cracking when he walks.  He is doing physical therapy.  I am sure that this will really make his life a lot better and he will have a much higher chance of losing weight.  He is still smoking.  He is trying to cut back now.  He smokes almost a pack a day.  He is eating okay.  He has had no problems with nausea or vomiting.  He has had no change in bowel or bladder habits.  There is been no rashes.  He has had no bleeding.  He has had no cough or shortness of breath.  I think he is using the CPAP.  Overall, I would say his performance status is probably ECOG 1.           Medications:  Allergies as of 08/26/2023   No Known Allergies      Medication List        Accurate as of August 26, 2023  2:36 PM. If you have any questions, ask your nurse or doctor.          albuterol (2.5 MG/3ML) 0.083% nebulizer solution Commonly known as: PROVENTIL Take 2.5 mg by nebulization as needed for wheezing or shortness of breath.   albuterol 108 (90 Base) MCG/ACT inhaler Commonly known as: VENTOLIN HFA INHALE 2 PUFFS EVERY 6 HOURS AS NEEDED FOR WHEEZING OR SHORTNESS OF BREATH   ALPRAZolam 1 MG tablet Commonly known as: XANAX Take 1 tablet (1 mg total) by mouth 3 (three) times daily as needed for anxiety.   calcium carbonate 500 MG chewable tablet Commonly known as: TUMS -  dosed in mg elemental calcium Chew 1,000 mg by mouth as needed for indigestion or heartburn.   chlorhexidine 4 % external liquid Commonly known as: HIBICLENS Apply 15 mLs (1 Application total) topically as directed for 30 doses. Use as directed daily for 5 days every other week for 6 weeks.   doxycycline 100 MG tablet Commonly known as: VIBRA-TABS Take 1 tablet (100 mg total) by mouth 2 (two) times daily. To be taken after surgery   losartan-hydrochlorothiazide 100-12.5 MG tablet Commonly known as: HYZAAR Take 1 tablet by mouth daily.   methocarbamol 750 MG tablet Commonly known as: ROBAXIN Take 1 tablet (750 mg total) by mouth 2 (two) times daily as needed for muscle spasms.   morphine 30 MG tablet Commonly known as: MSIR Take 1 tablet (30 mg total) by mouth every 4 (four) hours as needed for severe pain (pain score 7-10).   mupirocin ointment 2 % Commonly known as: BACTROBAN Place 1 Application into the nose 2 (two) times daily for 60 doses. Use as directed 2 times daily for 5 days every other week for 6 weeks.   mupirocin ointment 2 % Commonly known as: BACTROBAN Apply 1 Application topically 2 (two) times daily.  naloxone 4 MG/0.1ML Liqd nasal spray kit Commonly known as: NARCAN Place 0.4 mg into the nose once.   omeprazole 20 MG capsule Commonly known as: PRILOSEC Take 20 mg by mouth daily as needed (heartburn).   ondansetron 4 MG tablet Commonly known as: Zofran Take 1 tablet (4 mg total) by mouth every 8 (eight) hours as needed for nausea or vomiting.   rivaroxaban 10 MG Tabs tablet Commonly known as: XARELTO Take 1 tablet (10 mg total) by mouth daily. Take starting 2 weeks prior to surgery date, stop taking 2 days before surgery and restart 2 days after surgery for 1 month.   Spiriva Respimat 2.5 MCG/ACT Aers Generic drug: Tiotropium Bromide Monohydrate INHALE TWO PUFFS DAILY        Allergies:  No Known Allergies  Past Medical History, Surgical  history, Social history, and Family History were reviewed and updated.  Review of Systems: Review of Systems  Constitutional: Negative.   HENT: Negative.    Eyes: Negative.   Respiratory: Negative.    Cardiovascular: Negative.   Gastrointestinal: Negative.   Genitourinary: Negative.   Musculoskeletal:  Positive for back pain and neck pain.  Skin: Negative.   Neurological: Negative.   Endo/Heme/Allergies: Negative.   Psychiatric/Behavioral: Negative.       Physical Exam:  height is 6' (1.829 m) and weight is 279 lb (126.6 kg). His oral temperature is 97.9 F (36.6 C). His blood pressure is 109/60 and his pulse is 81. His respiration is 19 and oxygen saturation is 98%.   Wt Readings from Last 3 Encounters:  08/26/23 279 lb (126.6 kg)  08/09/23 278 lb (126.1 kg)  07/30/23 279 lb (126.6 kg)    Physical Exam Vitals reviewed.  HENT:     Head: Normocephalic and atraumatic.  Eyes:     Pupils: Pupils are equal, round, and reactive to light.  Cardiovascular:     Rate and Rhythm: Normal rate and regular rhythm.     Heart sounds: Normal heart sounds.  Pulmonary:     Effort: Pulmonary effort is normal.     Breath sounds: Normal breath sounds.  Abdominal:     General: Bowel sounds are normal.     Palpations: Abdomen is soft.  Musculoskeletal:        General: No tenderness or deformity. Normal range of motion.     Cervical back: Normal range of motion.     Comments: He has significant pain to palpation and range of motion of the right hip.  Lymphadenopathy:     Cervical: No cervical adenopathy.  Skin:    General: Skin is warm and dry.     Findings: No erythema or rash.  Neurological:     Mental Status: He is alert and oriented to person, place, and time.  Psychiatric:        Behavior: Behavior normal.        Thought Content: Thought content normal.        Judgment: Judgment normal.      Lab Results  Component Value Date   WBC 8.2 08/26/2023   HGB 12.5 (L) 08/26/2023    HCT 40.0 08/26/2023   MCV 82.6 08/26/2023   PLT 324 08/26/2023   Lab Results  Component Value Date   FERRITIN 7 (L) 05/15/2023   IRON 41 (L) 05/15/2023   TIBC 542 (H) 05/15/2023   UIBC 501 (H) 05/15/2023   IRONPCTSAT 8 (L) 05/15/2023   Lab Results  Component Value Date   RETICCTPCT 1.6 08/26/2023  RBC 4.84 08/26/2023   RBC 4.87 08/26/2023   RETICCTABS 77.0 02/22/2011   No results found for: "KPAFRELGTCHN", "LAMBDASER", "KAPLAMBRATIO" No results found for: "IGGSERUM", "IGA", "IGMSERUM" No results found for: "TOTALPROTELP", "ALBUMINELP", "A1GS", "A2GS", "BETS", "BETA2SER", "GAMS", "MSPIKE", "SPEI"   Chemistry      Component Value Date/Time   NA 135 08/26/2023 1323   NA 141 05/09/2017 0910   K 4.4 08/26/2023 1323   K 4.2 05/09/2017 0910   CL 96 (L) 08/26/2023 1323   CL 105 11/13/2016 1123   CO2 29 08/26/2023 1323   CO2 24 05/09/2017 0910   BUN 13 08/26/2023 1323   BUN 15.2 05/09/2017 0910   CREATININE 1.17 08/26/2023 1323   CREATININE 1.2 05/09/2017 0910      Component Value Date/Time   CALCIUM 8.7 (L) 08/26/2023 1323   CALCIUM 9.1 05/09/2017 0910   ALKPHOS 83 08/26/2023 1323   ALKPHOS 96 05/09/2017 0910   AST 28 08/26/2023 1323   AST 22 05/09/2017 0910   ALT 24 08/26/2023 1323   ALT 30 05/09/2017 0910   BILITOT 0.5 08/26/2023 1323   BILITOT 0.48 05/09/2017 0910      Impression and Plan: Jacob Bradford is a very pleasant 63 yo caucasian gentleman with secondary polycythemia.  He does not need to be phlebotomized today.  I am not surprised by this given that he had hip surgery.  I know that he will continue to improve.  He is incredibly motivated.  I think if he can just lose 50 pounds, this will really make his life a lot better.  I will continue to encourage him to use the CPAP.  I know this will help him out.  We will plan to get him back to see [place] in another 2 months now.  I think that by then, he should be done with physical therapy and hopefully driving  himself and losing weight.  I recommended that we do have him on low-dose Xarelto after surgery.  I told him to finish up the bottle of Xarelto just to not be wasteful.   Jacob Macho, MD 3/24/20252:36 PM

## 2023-08-27 LAB — IRON AND IRON BINDING CAPACITY (CC-WL,HP ONLY)
Iron: 65 ug/dL (ref 45–182)
Saturation Ratios: 14 % — ABNORMAL LOW (ref 17.9–39.5)
TIBC: 456 ug/dL — ABNORMAL HIGH (ref 250–450)
UIBC: 391 ug/dL — ABNORMAL HIGH (ref 117–376)

## 2023-09-11 ENCOUNTER — Other Ambulatory Visit (HOSPITAL_COMMUNITY): Payer: Self-pay

## 2023-09-13 ENCOUNTER — Other Ambulatory Visit: Payer: Self-pay | Admitting: Hematology & Oncology

## 2023-09-13 DIAGNOSIS — M47817 Spondylosis without myelopathy or radiculopathy, lumbosacral region: Secondary | ICD-10-CM

## 2023-09-13 MED ORDER — MORPHINE SULFATE 30 MG PO TABS: 30.0000 mg | ORAL_TABLET | ORAL | 0 refills | Status: DC | PRN

## 2023-09-24 ENCOUNTER — Ambulatory Visit (INDEPENDENT_AMBULATORY_CARE_PROVIDER_SITE_OTHER): Admitting: Physician Assistant

## 2023-09-24 ENCOUNTER — Other Ambulatory Visit (INDEPENDENT_AMBULATORY_CARE_PROVIDER_SITE_OTHER)

## 2023-09-24 DIAGNOSIS — Z96641 Presence of right artificial hip joint: Secondary | ICD-10-CM | POA: Diagnosis not present

## 2023-09-24 NOTE — Progress Notes (Signed)
 Post-Op Visit Note   Patient: Jacob Bradford           Date of Birth: Apr 01, 1961           MRN: [id number] Visit Date: 09/24/2023 PCP: Verlene Glimpse, NP   Assessment & Plan:  Chief Complaint:  Chief Complaint  Patient presents with   Right Hip - Follow-up   Visit Diagnoses:  1. Status post total replacement of right hip     Plan: Patient is a pleasant 63 year old gentleman who comes in today 6 weeks status post right total hip replacement 08/09/2023.  He has been doing well.  Some muscular soreness but nothing more.  He is on Xarelto  for which he started prior to surgery for polycythemia vera and is on this for 1 more month per physician managing this.  Examination of his right hip reveals a fully healed surgical scar without complication.  Painless hip flexion and logroll.  Calf soft nontender.  He is neurovascular intact distally.  At this point, he will continue to advance with activity as tolerated.  Dental prophylaxis reinforced.  Follow-up in 6 weeks for recheck.  Call with concerns or questions.  Follow-Up Instructions: Return in about 6 weeks (around 11/05/2023).   Orders:  Orders Placed This Encounter  Procedures   XR HIP UNILAT W OR W/O PELVIS 2-3 VIEWS RIGHT   No orders of the defined types were placed in this encounter.   Imaging: XR HIP UNILAT W OR W/O PELVIS 2-3 VIEWS RIGHT Result Date: 09/24/2023 Well-seated prosthesis without complication   PMFS History: Patient Active Problem List   Diagnosis Date Noted   Status post total replacement of right hip 08/09/2023   Oxygen dependent 09/26/2022   COPD (chronic obstructive pulmonary disease) (HCC) 09/07/2022   Snoring 07/19/2022   Chronic respiratory failure with hypoxia (HCC) 07/19/2022   Tobacco abuse 07/19/2022   AKI (acute kidney injury) (HCC) 06/03/2022   Influenza A 06/02/2022   Community acquired pneumonia 06/01/2022   Primary osteoarthritis of right hip 03/21/2022   Body mass index 40.0-44.9,  adult (HCC) 03/21/2022   IDA (iron deficiency anemia) 11/13/2016   S/P lumbar spinal fusion 03/07/2016   Lunotriquetral ligament tear 10/11/2014   Tear of anterior cruciate ligament of knee 10/11/2014   Lumbosacral spondylosis without myelopathy 04/24/2011   Polycythemia vera (HCC) 04/24/2011   Sprain of ankle 04/21/2009   Past Medical History:  Diagnosis Date   Anxiety    Arthritis    Depression    GERD (gastroesophageal reflux disease)    Hyperlipidemia    Hypertension    Lunotriquetral ligament tear 10/11/2014   Polycythemia vera (HCC)    Sleep apnea    Spinal stenosis    Tear of anterior cruciate ligament of knee 10/11/2014    Family History  Problem Relation Age of Onset   Heart disease Father    Colon cancer Neg Hx    Colon polyps Neg Hx    Esophageal cancer Neg Hx    Rectal cancer Neg Hx    Stomach cancer Neg Hx     Past Surgical History:  Procedure Laterality Date   COLONOSCOPY     age 72- 3 benign polyps per pt    MAXIMUM ACCESS (MAS)POSTERIOR LUMBAR INTERBODY FUSION (PLIF) 2 LEVEL N/A 03/07/2016   Procedure: LUMBAR FOUR-FIVE,LUMBAR FIVE-SACRAL ONE MAXIMUM ACCESS (MAS) POSTERIOR LUMBAR INTERBODY FUSION (PLIF) 2 LEVEL;  Surgeon: Isadora Mar, MD;  Location: [facility] OR;  Service: Neurosurgery;  Laterality: N/A;  TONSILLECTOMY     and Adenoid   TOTAL HIP ARTHROPLASTY Right 08/09/2023   Procedure: RIGHT TOTAL HIP ARTHROPLASTY ANTERIOR APPROACH;  Surgeon: Wes Hamman, MD;  Location: [facility] OR;  Service: Orthopedics;  Laterality: Right;  3-C   Social History   Occupational History   Occupation: Disabled since 2019  Tobacco Use   Smoking status: Every Day    Current packs/day: 0.00    Average packs/day: 0.5 packs/day for 45.0 years (22.5 ttl pk-yrs)    Types: Cigarettes    Start date: 07/14/1979    Last attempt to quit: 07/02/2021    Years since quitting: 2.2   Smokeless tobacco: Never   Tobacco comments:    Patient smoking a pack a day .   Vaping Use   Vaping  status: Former  Substance and Sexual Activity   Alcohol use: Yes    Alcohol/week: 0.0 standard drinks of alcohol    Comment: 2-3  drinks a month if this- rare    Drug use: No    Comment: THC gummy x 1   Sexual activity: Yes

## 2023-09-30 ENCOUNTER — Ambulatory Visit (INDEPENDENT_AMBULATORY_CARE_PROVIDER_SITE_OTHER): Payer: Medicare PPO | Admitting: Pulmonary Disease

## 2023-09-30 ENCOUNTER — Encounter: Payer: Self-pay | Admitting: Pulmonary Disease

## 2023-09-30 VITALS — BP 144/86 | HR 74 | Ht 72.0 in | Wt 273.6 lb

## 2023-09-30 DIAGNOSIS — G4733 Obstructive sleep apnea (adult) (pediatric): Secondary | ICD-10-CM

## 2023-09-30 DIAGNOSIS — Z720 Tobacco use: Secondary | ICD-10-CM | POA: Diagnosis not present

## 2023-09-30 DIAGNOSIS — J449 Chronic obstructive pulmonary disease, unspecified: Secondary | ICD-10-CM

## 2023-09-30 NOTE — Patient Instructions (Addendum)
 Trying to get to starting using his BiPAP on a regular basis  Graded activities as tolerated  Continue working on quitting smoking  Continue your Spiriva   Rescue inhaler use as needed  I will see you in about 4 months  Call us  with significant concerns

## 2023-09-30 NOTE — Progress Notes (Signed)
 Jacob Bradford    [id number]    1961/05/23  Primary Care Physician:Morton, Pearla Bottom, NP  Referring Physician: Verlene Glimpse, NP [address] [place],  [place] [address]  Chief complaint:   Patient with obstructive sleep apnea, in for follow-up  HPI:  History of complex sleep apnea Has not been using his BiPAP in the last month  Needing supplies  History of obstructive lung disease, active smoker trying to quit, down to about half a pack of cigarettes a day from about 2 packs a day  He does have some daytime sleepiness  Hospitalized in January for pneumonia and influenza  Chronic pain syndrome on narcotics, anxiety for which he is on Xanax  Outpatient Encounter Medications as of 09/30/2023  Medication Sig  . albuterol  (VENTOLIN  HFA) 108 (90 Base) MCG/ACT inhaler INHALE 2 PUFFS EVERY 6 HOURS AS NEEDED FOR WHEEZING OR SHORTNESS OF BREATH  . ALPRAZolam  (XANAX ) 1 MG tablet Take 1 tablet (1 mg total) by mouth 3 (three) times daily as needed for anxiety.  . calcium  carbonate (TUMS - DOSED IN MG ELEMENTAL CALCIUM ) 500 MG chewable tablet Chew 1,000 mg by mouth as needed for indigestion or heartburn.  . chlorhexidine  (HIBICLENS ) 4 % external liquid Apply 15 mLs (1 Application total) topically as directed for 30 doses. Use as directed daily for 5 days every other week for 6 weeks.  . doxycycline  (VIBRA -TABS) 100 MG tablet Take 1 tablet (100 mg total) by mouth 2 (two) times daily. To be taken after surgery  . losartan-hydrochlorothiazide  (HYZAAR) 100-12.5 MG tablet Take 1 tablet by mouth daily.  . methocarbamol  (ROBAXIN ) 750 MG tablet Take 1 tablet (750 mg total) by mouth 2 (two) times daily as needed for muscle spasms.  . morphine  (MSIR) 30 MG tablet Take 1 tablet (30 mg total) by mouth every 4 (four) hours as needed for severe pain (pain score 7-10).  . mupirocin  ointment (BACTROBAN ) 2 % Apply 1 Application topically 2 (two) times daily.  . naloxone (NARCAN) nasal  spray 4 mg/0.1 mL Place 0.4 mg into the nose once.  Aaron Aas omeprazole (PRILOSEC) 20 MG capsule Take 20 mg by mouth daily as needed (heartburn).  . ondansetron  (ZOFRAN ) 4 MG tablet Take 1 tablet (4 mg total) by mouth every 8 (eight) hours as needed for nausea or vomiting.  . rivaroxaban  (XARELTO ) 10 MG TABS tablet Take 1 tablet (10 mg total) by mouth daily. Take starting 2 weeks prior to surgery date, stop taking 2 days before surgery and restart 2 days after surgery for 1 month.  . Tiotropium Bromide Monohydrate  (SPIRIVA  RESPIMAT) 2.5 MCG/ACT AERS INHALE TWO PUFFS DAILY  . albuterol  (PROVENTIL ) (2.5 MG/3ML) 0.083% nebulizer solution Take 2.5 mg by nebulization as needed for wheezing or shortness of breath. (Patient not taking: Reported on 09/30/2023)   No facility-administered encounter medications on file as of 09/30/2023.    Allergies as of 09/30/2023  . (No Known Allergies)    Past Medical History:  Diagnosis Date  . Anxiety   . Arthritis   . Depression   . GERD (gastroesophageal reflux disease)   . Hyperlipidemia   . Hypertension   . Lunotriquetral ligament tear 10/11/2014  . Polycythemia vera (HCC)   . Sleep apnea   . Spinal stenosis   . Tear of anterior cruciate ligament of knee 10/11/2014    Past Surgical History:  Procedure Laterality Date  . COLONOSCOPY     age 44- 3 benign polyps  per pt   . MAXIMUM ACCESS (MAS)POSTERIOR LUMBAR INTERBODY FUSION (PLIF) 2 LEVEL N/A 03/07/2016   Procedure: LUMBAR FOUR-FIVE,LUMBAR FIVE-SACRAL ONE MAXIMUM ACCESS (MAS) POSTERIOR LUMBAR INTERBODY FUSION (PLIF) 2 LEVEL;  Surgeon: Isadora Mar, MD;  Location: [facility] OR;  Service: Neurosurgery;  Laterality: N/A;  . TONSILLECTOMY     and Adenoid  . TOTAL HIP ARTHROPLASTY Right 08/09/2023   Procedure: RIGHT TOTAL HIP ARTHROPLASTY ANTERIOR APPROACH;  Surgeon: Wes Hamman, MD;  Location: [facility] OR;  Service: Orthopedics;  Laterality: Right;  3-C    Family History  Problem Relation Age of Onset  . Heart disease  Father   . Colon cancer Neg Hx   . Colon polyps Neg Hx   . Esophageal cancer Neg Hx   . Rectal cancer Neg Hx   . Stomach cancer Neg Hx     Social History   Socioeconomic History  . Marital status: Widowed    Spouse name: Not on file  . Number of children: 2  . Years of education: Not on file  . Highest education level: Not on file  Occupational History  . Occupation: Disabled since 2019  Tobacco Use  . Smoking status: Every Day    Current packs/day: 0.00    Average packs/day: 0.5 packs/day for 45.0 years (22.5 ttl pk-yrs)    Types: Cigarettes    Start date: 07/14/1979    Last attempt to quit: 07/02/2021    Years since quitting: 2.2  . Smokeless tobacco: Never  . Tobacco comments:    6-8 cigs per day  Vaping Use  . Vaping status: Former  Substance and Sexual Activity  . Alcohol use: Yes    Alcohol/week: 0.0 standard drinks of alcohol    Comment: 2-3  drinks a month if this- rare   . Drug use: No    Comment: THC gummy x 1  . Sexual activity: Yes  Other Topics Concern  . Not on file  Social History Narrative  . Not on file   Social Drivers of Health   Financial Resource Strain: Not on file  Food Insecurity: No Food Insecurity (08/09/2023)   Hunger Vital Sign   . Worried About Programme researcher, broadcasting/film/video in the Last Year: Never true   . Ran Out of Food in the Last Year: Never true  Transportation Needs: No Transportation Needs (08/09/2023)   PRAPARE - Transportation   . Lack of Transportation (Medical): No   . Lack of Transportation (Non-Medical): No  Physical Activity: Not on file  Stress: Not on file  Social Connections: Moderately Integrated (08/09/2023)   Social Connection and Isolation Panel [NHANES]   . Frequency of Communication with Friends and Family: More than three times a week   . Frequency of Social Gatherings with Friends and Family: More than three times a week   . Attends Religious Services: 1 to 4 times per year   . Active Member of Clubs or Organizations: No    . Attends Banker Meetings: 1 to 4 times per year   . Marital Status: Widowed  Intimate Partner Violence: Not At Risk (08/09/2023)   Humiliation, Afraid, Rape, and Kick questionnaire   . Fear of Current or Ex-Partner: No   . Emotionally Abused: No   . Physically Abused: No   . Sexually Abused: No    Review of Systems  Constitutional:  Positive for fatigue.  Respiratory:  Positive for apnea and shortness of breath.   Psychiatric/Behavioral:  Positive for sleep disturbance.  Vitals:   09/30/23 1538 09/30/23 1539  BP: (!) 168/100 (!) 144/86  Pulse: 74   SpO2: 94%      Physical Exam Constitutional:      Appearance: He is obese.  HENT:     Head: Normocephalic.     Nose: No congestion.     Mouth/Throat:     Pharynx: No oropharyngeal exudate.  Eyes:     General: No scleral icterus. Cardiovascular:     Rate and Rhythm: Normal rate and regular rhythm.     Heart sounds: No murmur heard.    No friction rub.  Pulmonary:     Effort: No respiratory distress.     Breath sounds: No stridor. No wheezing or rhonchi.  Musculoskeletal:     Cervical back: No rigidity.  Neurological:     Mental Status: He is alert.  Psychiatric:        Mood and Affect: Mood normal.   Data Reviewed: Recent sleep study reviewed showing complex sleep apnea Suboptimal compliance with BiPAP ST Has not used BiPAP ST in the last month  Pulmonary function test with moderate obstruction in May 2024  Assessment:  Complex sleep apnea -Importance of treating his sleep disordered breathing discussed with the patient Cardiovascular neurological risk with noncompliance discussed  Obstructive lung disease Continue current inhalers -Encouraged to continue to work on quitting smoking  Active smoker  Class II obesity -Graded activities as tolerated  Smoking cessation counseling   Plan/Recommendations:  Smoking cessation  Encouraged to get back to using BiPAP ST -He needs to get  some supplies which I encouraged him to get sooner than later  Encouraged to call with concerns  Follow-up in 3 months  Weight loss efforts encouraged    Myer Artis MD [facility] 09/30/2023, 3:42 PM  CC: Verlene Glimpse, NP

## 2023-09-30 NOTE — Progress Notes (Signed)
 Jacob Bradford    [id number]    Nov 15, 1960  Primary Care Physician:Morton, Pearla Bottom, NP  Referring Physician: Verlene Glimpse, NP [address] [place],  [place] [address]  Chief complaint:   Patient with obstructive sleep apnea, in for follow-up  HPI:  Patient with history of complex sleep apnea Was able to get some BiPAP supplies Will start using his BiPAP  Was recently treated for MRSA infection was reason why he held off on starting BiPAP  He is an active smoker, cutting down on cigarette smoking down to about 8 cigarettes a day, this is much less than what he used to smoke  He does have a history of COPD  He does have some daytime sleepiness  Hospitalized in January for pneumonia and influenza  Chronic pain syndrome on narcotics, anxiety for which he is on Xanax   Overall he feels his breathing is better than the last time he was in the office  Outpatient Encounter Medications as of 09/30/2023  Medication Sig   albuterol  (VENTOLIN  HFA) 108 (90 Base) MCG/ACT inhaler INHALE 2 PUFFS EVERY 6 HOURS AS NEEDED FOR WHEEZING OR SHORTNESS OF BREATH   ALPRAZolam  (XANAX ) 1 MG tablet Take 1 tablet (1 mg total) by mouth 3 (three) times daily as needed for anxiety.   calcium  carbonate (TUMS - DOSED IN MG ELEMENTAL CALCIUM ) 500 MG chewable tablet Chew 1,000 mg by mouth as needed for indigestion or heartburn.   chlorhexidine  (HIBICLENS ) 4 % external liquid Apply 15 mLs (1 Application total) topically as directed for 30 doses. Use as directed daily for 5 days every other week for 6 weeks.   doxycycline  (VIBRA -TABS) 100 MG tablet Take 1 tablet (100 mg total) by mouth 2 (two) times daily. To be taken after surgery   losartan-hydrochlorothiazide  (HYZAAR) 100-12.5 MG tablet Take 1 tablet by mouth daily.   methocarbamol  (ROBAXIN ) 750 MG tablet Take 1 tablet (750 mg total) by mouth 2 (two) times daily as needed for muscle spasms.   morphine  (MSIR) 30 MG tablet Take 1 tablet  (30 mg total) by mouth every 4 (four) hours as needed for severe pain (pain score 7-10).   mupirocin  ointment (BACTROBAN ) 2 % Apply 1 Application topically 2 (two) times daily.   naloxone (NARCAN) nasal spray 4 mg/0.1 mL Place 0.4 mg into the nose once.   omeprazole (PRILOSEC) 20 MG capsule Take 20 mg by mouth daily as needed (heartburn).   ondansetron  (ZOFRAN ) 4 MG tablet Take 1 tablet (4 mg total) by mouth every 8 (eight) hours as needed for nausea or vomiting.   rivaroxaban  (XARELTO ) 10 MG TABS tablet Take 1 tablet (10 mg total) by mouth daily. Take starting 2 weeks prior to surgery date, stop taking 2 days before surgery and restart 2 days after surgery for 1 month.   Tiotropium Bromide Monohydrate  (SPIRIVA  RESPIMAT) 2.5 MCG/ACT AERS INHALE TWO PUFFS DAILY   albuterol  (PROVENTIL ) (2.5 MG/3ML) 0.083% nebulizer solution Take 2.5 mg by nebulization as needed for wheezing or shortness of breath. (Patient not taking: Reported on 09/30/2023)   No facility-administered encounter medications on file as of 09/30/2023.    Allergies as of 09/30/2023   (No Known Allergies)    Past Medical History:  Diagnosis Date   Anxiety    Arthritis    Depression    GERD (gastroesophageal reflux disease)    Hyperlipidemia    Hypertension    Lunotriquetral ligament tear 10/11/2014   Polycythemia vera (  HCC)    Sleep apnea    Spinal stenosis    Tear of anterior cruciate ligament of knee 10/11/2014    Past Surgical History:  Procedure Laterality Date   COLONOSCOPY     age 19- 3 benign polyps per pt    MAXIMUM ACCESS (MAS)POSTERIOR LUMBAR INTERBODY FUSION (PLIF) 2 LEVEL N/A 03/07/2016   Procedure: LUMBAR FOUR-FIVE,LUMBAR FIVE-SACRAL ONE MAXIMUM ACCESS (MAS) POSTERIOR LUMBAR INTERBODY FUSION (PLIF) 2 LEVEL;  Surgeon: Isadora Mar, MD;  Location: [facility] OR;  Service: Neurosurgery;  Laterality: N/A;   TONSILLECTOMY     and Adenoid   TOTAL HIP ARTHROPLASTY Right 08/09/2023   Procedure: RIGHT TOTAL HIP ARTHROPLASTY  ANTERIOR APPROACH;  Surgeon: Wes Hamman, MD;  Location: [facility] OR;  Service: Orthopedics;  Laterality: Right;  3-C    Family History  Problem Relation Age of Onset   Heart disease Father    Colon cancer Neg Hx    Colon polyps Neg Hx    Esophageal cancer Neg Hx    Rectal cancer Neg Hx    Stomach cancer Neg Hx     Social History   Socioeconomic History   Marital status: Widowed    Spouse name: Not on file   Number of children: 2   Years of education: Not on file   Highest education level: Not on file  Occupational History   Occupation: Disabled since 2019  Tobacco Use   Smoking status: Every Day    Current packs/day: 0.00    Average packs/day: 0.5 packs/day for 45.0 years (22.5 ttl pk-yrs)    Types: Cigarettes    Start date: 07/14/1979    Last attempt to quit: 07/02/2021    Years since quitting: 2.2   Smokeless tobacco: Never   Tobacco comments:    6-8 cigs per day  Vaping Use   Vaping status: Former  Substance and Sexual Activity   Alcohol use: Yes    Alcohol/week: 0.0 standard drinks of alcohol    Comment: 2-3  drinks a month if this- rare    Drug use: No    Comment: THC gummy x 1   Sexual activity: Yes  Other Topics Concern   Not on file  Social History Narrative   Not on file   Social Drivers of Health   Financial Resource Strain: Not on file  Food Insecurity: No Food Insecurity (08/09/2023)   Hunger Vital Sign    Worried About Running Out of Food in the Last Year: Never true    Ran Out of Food in the Last Year: Never true  Transportation Needs: No Transportation Needs (08/09/2023)   PRAPARE - Administrator, Civil Service (Medical): No    Lack of Transportation (Non-Medical): No  Physical Activity: Not on file  Stress: Not on file  Social Connections: Moderately Integrated (08/09/2023)   Social Connection and Isolation Panel [NHANES]    Frequency of Communication with Friends and Family: More than three times a week    Frequency of Social Gatherings  with Friends and Family: More than three times a week    Attends Religious Services: 1 to 4 times per year    Active Member of [employer] or Organizations: No    Attends Banker Meetings: 1 to 4 times per year    Marital Status: Widowed  Intimate Partner Violence: Not At Risk (08/09/2023)   Humiliation, Afraid, Rape, and Kick questionnaire    Fear of Current or Ex-Partner: No    Emotionally  Abused: No    Physically Abused: No    Sexually Abused: No    Review of Systems  Respiratory:  Positive for apnea and shortness of breath.   Psychiatric/Behavioral:  Positive for sleep disturbance.     Vitals:   09/30/23 1538 09/30/23 1539  BP: (!) 168/100 (!) 144/86  Pulse: 74   SpO2: 94%      Physical Exam Constitutional:      Appearance: He is obese.  HENT:     Head: Normocephalic.     Nose: No congestion.     Mouth/Throat:     Pharynx: No oropharyngeal exudate.  Eyes:     General: No scleral icterus. Cardiovascular:     Rate and Rhythm: Normal rate and regular rhythm.     Heart sounds: No murmur heard.    No friction rub.  Pulmonary:     Effort: No respiratory distress.     Breath sounds: No stridor. No wheezing or rhonchi.  Musculoskeletal:     Cervical back: No rigidity.  Neurological:     Mental Status: He is alert.  Psychiatric:        Mood and Affect: Mood normal.    Data Reviewed: Recent sleep study reviewed showing complex sleep apnea  Has not resumed using his BiPAP  Pulmonary function test with moderate obstruction in May 2024  Assessment:  Complex sleep apnea - Importance of using his BiPAP discussed with him  Obstructive lung disease - Compliant with inhalers  Active smoker - He has cut down significantly but still working on quitting finally  Class II obesity - He continues to work on weight loss efforts   Plan/Recommendations:  Smoking cessation counseling  Encouraged to continue working on getting back to using his BiPAP on a  regular basis  Follow-up in about 3 months  Encouraged to call with significant concerns   Myer Artis MD [facility] 09/30/2023, 3:47 PM  CC: Verlene Glimpse, NP

## 2023-10-09 ENCOUNTER — Other Ambulatory Visit: Payer: Self-pay

## 2023-10-09 ENCOUNTER — Telehealth: Payer: Self-pay

## 2023-10-09 NOTE — Telephone Encounter (Signed)
 Received a call from [facility] stating that their two retailers do not have Morphine  MSIR 30 mg and will not have it for the next month. Pharmacist states pt use to get Morphine  ER 60 mg and would like to know if he wanted to switch to that or states pt could call around to see if any local pharmacies have this RX. Checked with the [facility] pharmacy and they also said it is on back order for them. Left message on pt's VM asking what he would like to do and requested a CB.

## 2023-10-10 NOTE — Telephone Encounter (Signed)
 Spoke with pt who states that he would just like to try a different med because he has had so much trouble getting the Morphine . Dr Maria Shiner advised and is going to look into options.

## 2023-10-11 ENCOUNTER — Encounter: Payer: Self-pay | Admitting: Hematology & Oncology

## 2023-10-11 ENCOUNTER — Other Ambulatory Visit: Payer: Self-pay | Admitting: Hematology & Oncology

## 2023-10-11 MED ORDER — OXYCODONE HCL 15 MG PO TABS: 15.0000 mg | ORAL_TABLET | Freq: Four times a day (QID) | ORAL | 0 refills | Status: DC | PRN

## 2023-10-11 NOTE — Telephone Encounter (Signed)
 Dr Maria Shiner prescribed Oxycodone  15 mg to take every 6 hours PRN for pain #120 to [facility]. Pt advised of change and will call if any issues arise. Confirmed next appointment with Dr Maria Shiner on 10/22/23.

## 2023-10-22 ENCOUNTER — Inpatient Hospital Stay

## 2023-10-22 ENCOUNTER — Inpatient Hospital Stay: Admitting: Hematology & Oncology

## 2023-11-05 ENCOUNTER — Encounter: Payer: Self-pay | Admitting: Hematology & Oncology

## 2023-11-12 ENCOUNTER — Inpatient Hospital Stay

## 2023-11-12 ENCOUNTER — Inpatient Hospital Stay (HOSPITAL_BASED_OUTPATIENT_CLINIC_OR_DEPARTMENT_OTHER): Admitting: Hematology & Oncology

## 2023-11-12 ENCOUNTER — Encounter: Payer: Self-pay | Admitting: Hematology & Oncology

## 2023-11-12 ENCOUNTER — Other Ambulatory Visit: Payer: Self-pay

## 2023-11-12 ENCOUNTER — Inpatient Hospital Stay: Attending: Hematology & Oncology

## 2023-11-12 VITALS — BP 131/71 | HR 67 | Temp 97.7°F | Resp 19 | Ht 72.0 in | Wt 278.0 lb

## 2023-11-12 DIAGNOSIS — D45 Polycythemia vera: Secondary | ICD-10-CM | POA: Diagnosis present

## 2023-11-12 DIAGNOSIS — M51369 Other intervertebral disc degeneration, lumbar region without mention of lumbar back pain or lower extremity pain: Secondary | ICD-10-CM | POA: Diagnosis not present

## 2023-11-12 DIAGNOSIS — D751 Secondary polycythemia: Secondary | ICD-10-CM | POA: Insufficient documentation

## 2023-11-12 DIAGNOSIS — Z7982 Long term (current) use of aspirin: Secondary | ICD-10-CM | POA: Diagnosis not present

## 2023-11-12 LAB — CBC WITH DIFFERENTIAL (CANCER CENTER ONLY)
Abs Immature Granulocytes: 0.04 10*3/uL (ref 0.00–0.07)
Basophils Absolute: 0.1 10*3/uL (ref 0.0–0.1)
Basophils Relative: 1 %
Eosinophils Absolute: 0.3 10*3/uL (ref 0.0–0.5)
Eosinophils Relative: 3 %
HCT: 40.7 % (ref 39.0–52.0)
Hemoglobin: 13.4 g/dL (ref 13.0–17.0)
Immature Granulocytes: 1 %
Lymphocytes Relative: 32 %
Lymphs Abs: 2.4 10*3/uL (ref 0.7–4.0)
MCH: 26.8 pg (ref 26.0–34.0)
MCHC: 32.9 g/dL (ref 30.0–36.0)
MCV: 81.4 fL (ref 80.0–100.0)
Monocytes Absolute: 0.5 10*3/uL (ref 0.1–1.0)
Monocytes Relative: 6 %
Neutro Abs: 4.3 10*3/uL (ref 1.7–7.7)
Neutrophils Relative %: 57 %
Platelet Count: 164 10*3/uL (ref 150–400)
RBC: 5 MIL/uL (ref 4.22–5.81)
RDW: 15.8 % — ABNORMAL HIGH (ref 11.5–15.5)
WBC Count: 7.5 10*3/uL (ref 4.0–10.5)
nRBC: 0 % (ref 0.0–0.2)

## 2023-11-12 LAB — CMP (CANCER CENTER ONLY)
ALT: 25 U/L (ref 0–44)
AST: 18 U/L (ref 15–41)
Albumin: 3.9 g/dL (ref 3.5–5.0)
Alkaline Phosphatase: 70 U/L (ref 38–126)
Anion gap: 10 (ref 5–15)
BUN: 14 mg/dL (ref 8–23)
CO2: 30 mmol/L (ref 22–32)
Calcium: 8.1 mg/dL — ABNORMAL LOW (ref 8.9–10.3)
Chloride: 97 mmol/L — ABNORMAL LOW (ref 98–111)
Creatinine: 1.48 mg/dL — ABNORMAL HIGH (ref 0.61–1.24)
GFR, Estimated: 53 mL/min — ABNORMAL LOW (ref >60)
Glucose, Bld: 130 mg/dL — ABNORMAL HIGH (ref 70–99)
Potassium: 3.8 mmol/L (ref 3.5–5.1)
Sodium: 137 mmol/L (ref 135–145)
Total Bilirubin: 0.4 mg/dL (ref 0.0–1.2)
Total Protein: 6.5 g/dL (ref 6.5–8.1)

## 2023-11-12 LAB — IRON AND IRON BINDING CAPACITY (CC-WL,HP ONLY)
Iron: 42 ug/dL — ABNORMAL LOW (ref 45–182)
Saturation Ratios: 9 % — ABNORMAL LOW (ref 17.9–39.5)
TIBC: 490 ug/dL — ABNORMAL HIGH (ref 250–450)
UIBC: 448 ug/dL — ABNORMAL HIGH (ref 117–376)

## 2023-11-12 LAB — FERRITIN: Ferritin: 14 ng/mL — ABNORMAL LOW (ref 24–336)

## 2023-11-12 MED ORDER — OXYCODONE HCL 15 MG PO TABS
15.0000 mg | ORAL_TABLET | Freq: Four times a day (QID) | ORAL | 0 refills | Status: DC | PRN
Start: 2023-11-12 — End: 2023-12-12

## 2023-11-12 NOTE — Addendum Note (Signed)
 Addended by: Gray Layman R on: 11/12/2023 02:03 PM   Modules accepted: Orders
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
## 2023-12-11 ENCOUNTER — Other Ambulatory Visit: Payer: Self-pay | Admitting: Hematology & Oncology

## 2023-12-12 ENCOUNTER — Encounter: Payer: Self-pay | Admitting: Hematology & Oncology

## 2023-12-12 ENCOUNTER — Other Ambulatory Visit: Payer: Self-pay | Admitting: Pulmonary Disease

## 2024-01-08 ENCOUNTER — Other Ambulatory Visit: Payer: Self-pay | Admitting: Hematology & Oncology

## 2024-01-30 ENCOUNTER — Ambulatory Visit: Admitting: Pulmonary Disease

## 2024-02-07 ENCOUNTER — Other Ambulatory Visit: Payer: Self-pay | Admitting: Hematology & Oncology

## 2024-02-11 ENCOUNTER — Inpatient Hospital Stay: Attending: Hematology & Oncology

## 2024-02-11 ENCOUNTER — Inpatient Hospital Stay

## 2024-02-11 ENCOUNTER — Encounter: Payer: Self-pay | Admitting: Hematology & Oncology

## 2024-02-11 ENCOUNTER — Inpatient Hospital Stay (HOSPITAL_BASED_OUTPATIENT_CLINIC_OR_DEPARTMENT_OTHER): Admitting: Hematology & Oncology

## 2024-02-11 VITALS — BP 124/55 | HR 75 | Temp 97.6°F | Resp 24 | Ht 72.0 in | Wt 283.3 lb

## 2024-02-11 DIAGNOSIS — M51379 Other intervertebral disc degeneration, lumbosacral region without mention of lumbar back pain or lower extremity pain: Secondary | ICD-10-CM | POA: Insufficient documentation

## 2024-02-11 DIAGNOSIS — G8929 Other chronic pain: Secondary | ICD-10-CM | POA: Diagnosis not present

## 2024-02-11 DIAGNOSIS — M545 Low back pain, unspecified: Secondary | ICD-10-CM | POA: Diagnosis not present

## 2024-02-11 DIAGNOSIS — D45 Polycythemia vera: Secondary | ICD-10-CM

## 2024-02-11 DIAGNOSIS — F17200 Nicotine dependence, unspecified, uncomplicated: Secondary | ICD-10-CM | POA: Insufficient documentation

## 2024-02-11 DIAGNOSIS — Z7982 Long term (current) use of aspirin: Secondary | ICD-10-CM | POA: Diagnosis not present

## 2024-02-11 LAB — CBC WITH DIFFERENTIAL (CANCER CENTER ONLY)
Abs Immature Granulocytes: 0.04 K/uL (ref 0.00–0.07)
Basophils Absolute: 0.1 K/uL (ref 0.0–0.1)
Basophils Relative: 1 %
Eosinophils Absolute: 0.3 K/uL (ref 0.0–0.5)
Eosinophils Relative: 4 %
HCT: 41.5 % (ref 39.0–52.0)
Hemoglobin: 13.8 g/dL (ref 13.0–17.0)
Immature Granulocytes: 1 %
Lymphocytes Relative: 26 %
Lymphs Abs: 1.8 K/uL (ref 0.7–4.0)
MCH: 27.9 pg (ref 26.0–34.0)
MCHC: 33.3 g/dL (ref 30.0–36.0)
MCV: 83.8 fL (ref 80.0–100.0)
Monocytes Absolute: 0.6 K/uL (ref 0.1–1.0)
Monocytes Relative: 8 %
Neutro Abs: 4.3 K/uL (ref 1.7–7.7)
Neutrophils Relative %: 60 %
Platelet Count: 172 K/uL (ref 150–400)
RBC: 4.95 MIL/uL (ref 4.22–5.81)
RDW: 15.1 % (ref 11.5–15.5)
WBC Count: 7.1 K/uL (ref 4.0–10.5)
nRBC: 0 % (ref 0.0–0.2)

## 2024-02-11 LAB — RETICULOCYTES
Immature Retic Fract: 7.3 % (ref 2.3–15.9)
RBC.: 4.92 MIL/uL (ref 4.22–5.81)
Retic Count, Absolute: 71.8 K/uL (ref 19.0–186.0)
Retic Ct Pct: 1.5 % (ref 0.4–3.1)

## 2024-02-11 LAB — CMP (CANCER CENTER ONLY)
ALT: 33 U/L (ref 0–44)
AST: 22 U/L (ref 15–41)
Albumin: 4 g/dL (ref 3.5–5.0)
Alkaline Phosphatase: 104 U/L (ref 38–126)
Anion gap: 12 (ref 5–15)
BUN: 13 mg/dL (ref 8–23)
CO2: 25 mmol/L (ref 22–32)
Calcium: 8.5 mg/dL — ABNORMAL LOW (ref 8.9–10.3)
Chloride: 96 mmol/L — ABNORMAL LOW (ref 98–111)
Creatinine: 1.13 mg/dL (ref 0.61–1.24)
GFR, Estimated: >60 mL/min (ref >60)
Glucose, Bld: 150 mg/dL — ABNORMAL HIGH (ref 70–99)
Potassium: 4.2 mmol/L (ref 3.5–5.1)
Sodium: 133 mmol/L — ABNORMAL LOW (ref 135–145)
Total Bilirubin: 0.2 mg/dL (ref 0.0–1.2)
Total Protein: 6.6 g/dL (ref 6.5–8.1)

## 2024-02-11 LAB — IRON AND IRON BINDING CAPACITY (CC-WL,HP ONLY)
Iron: 34 ug/dL — ABNORMAL LOW (ref 45–182)
Saturation Ratios: 7 % — ABNORMAL LOW (ref 17.9–39.5)
TIBC: 470 ug/dL — ABNORMAL HIGH (ref 250–450)
UIBC: 436 ug/dL

## 2024-02-11 LAB — FERRITIN: Ferritin: 31 ng/mL (ref 24–336)
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
## 2024-03-09 ENCOUNTER — Other Ambulatory Visit: Payer: Self-pay | Admitting: Hematology & Oncology

## 2024-04-06 ENCOUNTER — Encounter: Payer: Self-pay | Admitting: Radiology

## 2024-04-08 ENCOUNTER — Other Ambulatory Visit: Payer: Self-pay | Admitting: Hematology & Oncology

## 2024-04-15 ENCOUNTER — Inpatient Hospital Stay

## 2024-04-15 ENCOUNTER — Inpatient Hospital Stay: Attending: Hematology & Oncology

## 2024-04-15 ENCOUNTER — Inpatient Hospital Stay: Admitting: Hematology & Oncology

## 2024-04-15 ENCOUNTER — Encounter: Payer: Self-pay | Admitting: Hematology & Oncology

## 2024-04-15 VITALS — BP 126/71 | HR 67

## 2024-04-15 VITALS — BP 128/73 | HR 84 | Temp 98.1°F | Resp 20 | Ht 72.0 in | Wt 294.1 lb

## 2024-04-15 DIAGNOSIS — D45 Polycythemia vera: Secondary | ICD-10-CM

## 2024-04-15 DIAGNOSIS — D751 Secondary polycythemia: Secondary | ICD-10-CM | POA: Insufficient documentation

## 2024-04-15 LAB — CMP (CANCER CENTER ONLY)
ALT: 32 U/L (ref 0–44)
AST: 25 U/L (ref 15–41)
Albumin: 4.2 g/dL (ref 3.5–5.0)
Alkaline Phosphatase: 95 U/L (ref 38–126)
Anion gap: 13 (ref 5–15)
BUN: 20 mg/dL (ref 8–23)
CO2: 27 mmol/L (ref 22–32)
Calcium: 9.3 mg/dL (ref 8.9–10.3)
Chloride: 95 mmol/L — ABNORMAL LOW (ref 98–111)
Creatinine: 1.28 mg/dL — ABNORMAL HIGH (ref 0.61–1.24)
GFR, Estimated: >60 mL/min (ref >60)
Glucose, Bld: 144 mg/dL — ABNORMAL HIGH (ref 70–99)
Potassium: 4.6 mmol/L (ref 3.5–5.1)
Sodium: 134 mmol/L — ABNORMAL LOW (ref 135–145)
Total Bilirubin: 0.5 mg/dL (ref 0.0–1.2)
Total Protein: 7.1 g/dL (ref 6.5–8.1)

## 2024-04-15 LAB — CBC WITH DIFFERENTIAL (CANCER CENTER ONLY)
Abs Immature Granulocytes: 0.06 K/uL (ref 0.00–0.07)
Basophils Absolute: 0.1 K/uL (ref 0.0–0.1)
Basophils Relative: 1 %
Eosinophils Absolute: 0.4 K/uL (ref 0.0–0.5)
Eosinophils Relative: 4 %
HCT: 44.4 % (ref 39.0–52.0)
Hemoglobin: 14.4 g/dL (ref 13.0–17.0)
Immature Granulocytes: 1 %
Lymphocytes Relative: 17 %
Lymphs Abs: 1.5 K/uL (ref 0.7–4.0)
MCH: 28.2 pg (ref 26.0–34.0)
MCHC: 32.4 g/dL (ref 30.0–36.0)
MCV: 87.1 fL (ref 80.0–100.0)
Monocytes Absolute: 0.8 K/uL (ref 0.1–1.0)
Monocytes Relative: 10 %
Neutro Abs: 5.7 K/uL (ref 1.7–7.7)
Neutrophils Relative %: 67 %
Platelet Count: 210 K/uL (ref 150–400)
RBC: 5.1 MIL/uL (ref 4.22–5.81)
RDW: 14.4 % (ref 11.5–15.5)
WBC Count: 8.4 K/uL (ref 4.0–10.5)
nRBC: 0 % (ref 0.0–0.2)

## 2024-04-15 LAB — IRON AND IRON BINDING CAPACITY (CC-WL,HP ONLY)
Iron: 91 ug/dL (ref 45–182)
Saturation Ratios: 19 % (ref 17.9–39.5)
TIBC: 484 ug/dL — ABNORMAL HIGH (ref 250–450)
UIBC: 393 ug/dL

## 2024-04-15 LAB — FERRITIN: Ferritin: 64 ng/mL (ref 24–336)

## 2024-04-15 NOTE — Progress Notes (Signed)
 SABRA

## 2024-04-15 NOTE — Progress Notes (Signed)
 Jacob Bradford presents today for phlebotomy per MD orders. Phlebotomy procedure started at 1245 and ended at 1250. 550 grams removed. Patient observed for 30 minutes after procedure without any incident. Patient tolerated procedure well. IV needle removed intact.

## 2024-04-15 NOTE — Patient Instructions (Signed)
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
## 2024-04-20 ENCOUNTER — Other Ambulatory Visit: Payer: Self-pay | Admitting: Pulmonary Disease

## 2024-05-06 ENCOUNTER — Other Ambulatory Visit: Payer: Self-pay | Admitting: Hematology & Oncology

## 2024-06-05 ENCOUNTER — Other Ambulatory Visit: Payer: Self-pay | Admitting: Hematology & Oncology

## 2024-06-15 ENCOUNTER — Inpatient Hospital Stay

## 2024-06-15 ENCOUNTER — Inpatient Hospital Stay: Admitting: Hematology & Oncology

## 2024-07-01 ENCOUNTER — Inpatient Hospital Stay

## 2024-07-01 ENCOUNTER — Inpatient Hospital Stay: Admitting: Hematology & Oncology

## 2024-07-01 ENCOUNTER — Inpatient Hospital Stay: Attending: Hematology & Oncology

## 2024-07-01 ENCOUNTER — Encounter: Payer: Self-pay | Admitting: Hematology & Oncology

## 2024-07-01 VITALS — BP 158/76 | HR 83 | Temp 98.2°F | Resp 20 | Ht 72.0 in | Wt 299.4 lb

## 2024-07-01 DIAGNOSIS — D45 Polycythemia vera: Secondary | ICD-10-CM | POA: Diagnosis not present

## 2024-07-01 LAB — CBC WITH DIFFERENTIAL (CANCER CENTER ONLY)
Abs Immature Granulocytes: 0.07 10*3/uL (ref 0.00–0.07)
Basophils Absolute: 0.1 10*3/uL (ref 0.0–0.1)
Basophils Relative: 1 %
Eosinophils Absolute: 0.2 10*3/uL (ref 0.0–0.5)
Eosinophils Relative: 2 %
HCT: 43.3 % (ref 39.0–52.0)
Hemoglobin: 14.1 g/dL (ref 13.0–17.0)
Immature Granulocytes: 1 %
Lymphocytes Relative: 30 %
Lymphs Abs: 2.7 10*3/uL (ref 0.7–4.0)
MCH: 28.9 pg (ref 26.0–34.0)
MCHC: 32.6 g/dL (ref 30.0–36.0)
MCV: 88.7 fL (ref 80.0–100.0)
Monocytes Absolute: 0.9 10*3/uL (ref 0.1–1.0)
Monocytes Relative: 10 %
Neutro Abs: 5.1 10*3/uL (ref 1.7–7.7)
Neutrophils Relative %: 56 %
Platelet Count: 216 10*3/uL (ref 150–400)
RBC: 4.88 MIL/uL (ref 4.22–5.81)
RDW: 13.8 % (ref 11.5–15.5)
WBC Count: 9 10*3/uL (ref 4.0–10.5)
nRBC: 0 % (ref 0.0–0.2)

## 2024-07-01 LAB — CMP (CANCER CENTER ONLY)
ALT: 18 U/L (ref 0–44)
AST: 16 U/L (ref 15–41)
Albumin: 3.9 g/dL (ref 3.5–5.0)
Alkaline Phosphatase: 95 U/L (ref 38–126)
Anion gap: 10 (ref 5–15)
BUN: 19 mg/dL (ref 8–23)
CO2: 27 mmol/L (ref 22–32)
Calcium: 8.5 mg/dL — ABNORMAL LOW (ref 8.9–10.3)
Chloride: 99 mmol/L (ref 98–111)
Creatinine: 1.32 mg/dL — ABNORMAL HIGH (ref 0.61–1.24)
GFR, Estimated: >60 mL/min
Glucose, Bld: 147 mg/dL — ABNORMAL HIGH (ref 70–99)
Potassium: 4.2 mmol/L (ref 3.5–5.1)
Sodium: 136 mmol/L (ref 135–145)
Total Bilirubin: 0.3 mg/dL (ref 0.0–1.2)
Total Protein: 6.5 g/dL (ref 6.5–8.1)

## 2024-07-01 LAB — LACTATE DEHYDROGENASE: LDH: 195 U/L (ref 105–235)

## 2024-07-01 NOTE — Progress Notes (Signed)
 BP remains elevated, 158/76, did not take BP med this morning. Instructed to monitor at home and notify PCP if it remains over 140/90. To see PCP next week.
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
## 2024-07-04 ENCOUNTER — Emergency Department (HOSPITAL_COMMUNITY)

## 2024-07-04 ENCOUNTER — Emergency Department (HOSPITAL_COMMUNITY)
Admission: EM | Admit: 2024-07-04 | Discharge: 2024-07-04 | Disposition: A | Discharge status: Home / Self Care | Attending: Emergency Medicine | Admitting: Emergency Medicine

## 2024-07-04 ENCOUNTER — Other Ambulatory Visit: Payer: Self-pay

## 2024-07-04 ENCOUNTER — Other Ambulatory Visit: Payer: Self-pay | Admitting: Hematology & Oncology

## 2024-07-04 DIAGNOSIS — Z79899 Other long term (current) drug therapy: Secondary | ICD-10-CM | POA: Diagnosis not present

## 2024-07-04 DIAGNOSIS — R0602 Shortness of breath: Secondary | ICD-10-CM | POA: Diagnosis present

## 2024-07-04 DIAGNOSIS — Z7982 Long term (current) use of aspirin: Secondary | ICD-10-CM | POA: Insufficient documentation

## 2024-07-04 DIAGNOSIS — I10 Essential (primary) hypertension: Secondary | ICD-10-CM | POA: Diagnosis not present

## 2024-07-04 DIAGNOSIS — I160 Hypertensive urgency: Secondary | ICD-10-CM | POA: Diagnosis not present

## 2024-07-04 LAB — COMPREHENSIVE METABOLIC PANEL WITH GFR
ALT: 15 U/L (ref 0–44)
AST: 20 U/L (ref 15–41)
Albumin: 4.2 g/dL (ref 3.5–5.0)
Alkaline Phosphatase: 101 U/L (ref 38–126)
Anion gap: 13 (ref 5–15)
BUN: 20 mg/dL (ref 8–23)
CO2: 25 mmol/L (ref 22–32)
Calcium: 9.4 mg/dL (ref 8.9–10.3)
Chloride: 100 mmol/L (ref 98–111)
Creatinine, Ser: 1.36 mg/dL — ABNORMAL HIGH (ref 0.61–1.24)
GFR, Estimated: 58 mL/min — ABNORMAL LOW
Glucose, Bld: 142 mg/dL — ABNORMAL HIGH (ref 70–99)
Potassium: 4.5 mmol/L (ref 3.5–5.1)
Sodium: 138 mmol/L (ref 135–145)
Total Bilirubin: 0.3 mg/dL (ref 0.0–1.2)
Total Protein: 7.4 g/dL (ref 6.5–8.1)

## 2024-07-04 LAB — CBC WITH DIFFERENTIAL/PLATELET
Abs Immature Granulocytes: 0.04 10*3/uL (ref 0.00–0.07)
Basophils Absolute: 0.1 10*3/uL (ref 0.0–0.1)
Basophils Relative: 1 %
Eosinophils Absolute: 0.3 10*3/uL (ref 0.0–0.5)
Eosinophils Relative: 4 %
HCT: 46 % (ref 39.0–52.0)
Hemoglobin: 14.9 g/dL (ref 13.0–17.0)
Immature Granulocytes: 0 %
Lymphocytes Relative: 22 %
Lymphs Abs: 2 10*3/uL (ref 0.7–4.0)
MCH: 28.8 pg (ref 26.0–34.0)
MCHC: 32.4 g/dL (ref 30.0–36.0)
MCV: 88.8 fL (ref 80.0–100.0)
Monocytes Absolute: 0.7 10*3/uL (ref 0.1–1.0)
Monocytes Relative: 8 %
Neutro Abs: 5.9 10*3/uL (ref 1.7–7.7)
Neutrophils Relative %: 65 %
Platelets: 206 10*3/uL (ref 150–400)
RBC: 5.18 MIL/uL (ref 4.22–5.81)
RDW: 13.9 % (ref 11.5–15.5)
WBC: 9.1 10*3/uL (ref 4.0–10.5)
nRBC: 0 % (ref 0.0–0.2)

## 2024-07-04 LAB — PRO BRAIN NATRIURETIC PEPTIDE: Pro Brain Natriuretic Peptide: 85.2 pg/mL

## 2024-07-04 NOTE — Discharge Instructions (Signed)
 As discussed, today's evaluation has been generally reassuring.  Your blood pressure numbers are slightly elevated, but your labs did not demonstrate substantial abnormalities.  Given your history of medical problems it is important to follow-up with your physician as scheduled Wednesday.  Return here for concerning changes in your condition.

## 2024-07-04 NOTE — ED Provider Notes (Signed)
 " [place] [facility] AT [facility] Provider Note   CSN: [id number] Arrival date & time: 07/04/24  1455     Patient presents with: Hypertension   Jacob Bradford is a 64 y.o. male.   HPI Presents with 3 family members with history.  Presents with generalized unwell sensation, mild shortness of breath, does not describe chest pain.  He has primary concern for elevated blood pressure readings taken over the past day.  He notes that has been using 2 machines, and over the course of the past few hours each subsequent reading has been elevated.     Prior to Admission medications  Medication Sig Start Date End Date Taking? Authorizing Provider  albuterol  (PROVENTIL ) (2.5 MG/3ML) 0.083% nebulizer solution INHALE THREE ML via NEBULIZER THREE TIMES DAILY AS NEEDED 04/21/24   Olalere, Adewale A, MD  albuterol  (VENTOLIN  HFA) 108 (90 Base) MCG/ACT inhaler INHALE 2 PUFFS EVERY 6 HOURS AS NEEDED FOR WHEEZING OR SHORTNESS OF BREATH 12/12/23   Olalere, Adewale A, MD  ALPRAZolam  (XANAX ) 1 MG tablet Take 1 tablet (1 mg total) by mouth 3 (three) times daily as needed for anxiety. 11/17/15   Timmy Maude SAUNDERS, MD  Aspirin  81 MG CAPS 81 mg daily.    [provider]  calcium  carbonate (TUMS - DOSED IN MG ELEMENTAL CALCIUM ) 500 MG chewable tablet Chew 1,000 mg by mouth as needed for indigestion or heartburn. Patient not taking: Reported on 07/01/2024    [provider]  furosemide (LASIX) 20 MG tablet Take 20 mg by mouth every morning. Patient not taking: Reported on 07/01/2024 04/10/24   [provider]  loperamide (IMODIUM) 2 MG capsule Take 2 mg by mouth as needed for diarrhea or loose stools.    [provider]  losartan-hydrochlorothiazide  (HYZAAR) 100-12.5 MG tablet Take 1 tablet by mouth daily. 11/04/19   [provider]  naloxone [facility]) nasal spray 4 mg/0.1 mL Place 0.4 mg into the nose once. Patient not taking: Reported on 07/01/2024     [provider]  omeprazole (PRILOSEC) 20 MG capsule Take 20 mg by mouth daily as needed (heartburn). 04/16/23   [provider]  oxyCODONE  (ROXICODONE ) 15 MG immediate release tablet TAKE ONE TABLET EVERY 6 HOURS AS NEEDED FOR PAIN 06/05/24   Ennever, Peter R, MD  polyethylene glycol (MIRALAX  / GLYCOLAX ) 17 g packet Take 17 g by mouth daily as needed.    [provider]  Tiotropium Bromide Monohydrate  (SPIRIVA  RESPIMAT) 2.5 MCG/ACT AERS INHALE TWO PUFFS DAILY 05/06/23   Neda Jennet LABOR, MD    Allergies: Patient has no known allergies.    Review of Systems  Updated Vital Signs BP (!) 146/81   Pulse 67   Temp 98 F (36.7 C)   Resp 15   SpO2 94%   Physical Exam Vitals and nursing note reviewed.  Constitutional:      General: He is not in acute distress.    Appearance: He is well-developed.  HENT:     Head: Normocephalic and atraumatic.  Eyes:     Conjunctiva/sclera: Conjunctivae normal.  Cardiovascular:     Rate and Rhythm: Normal rate and regular rhythm.  Pulmonary:     Effort: Pulmonary effort is normal. No respiratory distress.     Breath sounds: No stridor.  Abdominal:     General: There is no distension.  Skin:    General: Skin is warm and dry.  Neurological:     Mental Status: He is alert  and oriented to person, place, and time.     (all labs ordered are listed, but only abnormal results are displayed) Labs Reviewed  COMPREHENSIVE METABOLIC PANEL WITH GFR - Abnormal; Notable for the following components:      Result Value   Glucose, Bld 142 (*)    Creatinine, Ser 1.36 (*)    GFR, Estimated 58 (*)    All other components within normal limits  CBC WITH DIFFERENTIAL/PLATELET  PRO BRAIN NATRIURETIC PEPTIDE    EKG: EKG Interpretation Date/Time:  Saturday July 04 2024 16:20:03 EST Ventricular Rate:  71 PR Interval:  153 QRS Duration:  106 QT Interval:  425 QTC Calculation: 462 R Axis:   41  Text Interpretation: Sinus rhythm  Probable left atrial enlargement RSR' in V1 or V2, right VCD or RVH Artifact Confirmed by Garrick Charleston [phone]) on 07/04/2024 4:51:51 PM  Radiology: [facility] Chest Port 1 View Result Date: 07/04/2024 CLINICAL DATA:  Shortness of breath, hypertension, urgency. EXAM: PORTABLE CHEST 1 VIEW COMPARISON:  07/19/2022. FINDINGS: The heart is enlarged and the mediastinal contour is within normal limits. No consolidation, effusion, or pneumothorax is seen. No acute osseous abnormality. IMPRESSION: No active disease. Electronically Signed   By: Leita Birmingham M.D.   On: 07/04/2024 16:33     Procedures   Medications Ordered in the ED - No data to display                                  Medical Decision Making Adult male with history of polycythemia vera, hypertension presents with generalized fatigue, shortness of breath, hypertension. Differential includes worsening disease, pneumonia, symptomatic anemia, hypertensive crisis, renal impairment. Initial vitals reassuring cardiac 75 sinus normal pulse ox 95% room air normal  Amount and/or Complexity of Data Reviewed Independent Historian:     Details: 3 family members at bedside External Data Reviewed: notes. Labs: ordered. Decision-making details documented in ED Course. Radiology: ordered and independent interpretation performed. Decision-making details documented in ED Course. ECG/medicine tests: ordered and independent interpretation performed. Decision-making details documented in ED Course.  Risk Prescription drug management. Decision regarding hospitalization. Diagnosis or treatment significantly limited by social determinants of health.   5:08 PM Patient in no distress, blood pressure 146/80.  Labs reviewed, discussed, he has known CKD, this is essentially unchanged, x-ray without pneumonia, congestion, patient has no oxygen requirement, and with no substantial abnormalities, patient will follow-up with primary care for consideration of his  hypertensive urgency.     Final diagnoses:  Hypertensive urgency    ED Discharge Orders     None          Garrick Charleston, MD 07/04/24 1709  "

## 2024-07-04 NOTE — ED Triage Notes (Signed)
 Pt has c/o elevated BP at home, denies any symptoms. Pt was told to take a second dose of med, but still reading 200s systolic.

## 2024-07-06 ENCOUNTER — Encounter: Payer: Self-pay | Admitting: Hematology & Oncology

## 2024-09-30 ENCOUNTER — Inpatient Hospital Stay: Admitting: Hematology & Oncology

## 2024-09-30 ENCOUNTER — Inpatient Hospital Stay
